# Patient Record
Sex: Male | Born: 1960 | Hispanic: Yes | Marital: Single | Smoking: Former smoker
Health system: Southern US, Community
[De-identification: ages and names within clinical notes are randomized; demographics above are authoritative.]

## PROBLEM LIST (undated history)

## (undated) DIAGNOSIS — M1712 Unilateral primary osteoarthritis, left knee: Secondary | ICD-10-CM

## (undated) DIAGNOSIS — E7800 Pure hypercholesterolemia, unspecified: Secondary | ICD-10-CM

## (undated) DIAGNOSIS — E785 Hyperlipidemia, unspecified: Secondary | ICD-10-CM

## (undated) DIAGNOSIS — E119 Type 2 diabetes mellitus without complications: Secondary | ICD-10-CM

## (undated) DIAGNOSIS — Z720 Tobacco use: Secondary | ICD-10-CM

## (undated) DIAGNOSIS — M4802 Spinal stenosis, cervical region: Secondary | ICD-10-CM

## (undated) HISTORY — DX: Type 2 diabetes mellitus without complications: E11.9

## (undated) HISTORY — DX: Tobacco use: Z72.0

## (undated) HISTORY — DX: Unilateral primary osteoarthritis, left knee: M17.12

## (undated) HISTORY — DX: Hyperlipidemia, unspecified: E78.5

## (undated) HISTORY — DX: Spinal stenosis, cervical region: M48.02

---

## 2010-05-10 ENCOUNTER — Emergency Department (HOSPITAL_COMMUNITY): Payer: Self-pay

## 2010-05-10 ENCOUNTER — Observation Stay (HOSPITAL_COMMUNITY)
Admission: EM | Admit: 2010-05-10 | Discharge: 2010-05-12 | Disposition: A | Discharge status: Home / Self Care | Payer: Self-pay | Attending: Internal Medicine | Admitting: Internal Medicine

## 2010-05-10 DIAGNOSIS — R079 Chest pain, unspecified: Principal | ICD-10-CM | POA: Insufficient documentation

## 2010-05-10 DIAGNOSIS — I10 Essential (primary) hypertension: Secondary | ICD-10-CM | POA: Insufficient documentation

## 2010-05-10 DIAGNOSIS — E119 Type 2 diabetes mellitus without complications: Secondary | ICD-10-CM | POA: Insufficient documentation

## 2010-05-10 DIAGNOSIS — R0609 Other forms of dyspnea: Secondary | ICD-10-CM | POA: Insufficient documentation

## 2010-05-10 DIAGNOSIS — R0989 Other specified symptoms and signs involving the circulatory and respiratory systems: Secondary | ICD-10-CM | POA: Insufficient documentation

## 2010-05-10 DIAGNOSIS — R0602 Shortness of breath: Secondary | ICD-10-CM | POA: Insufficient documentation

## 2010-05-10 DIAGNOSIS — F1010 Alcohol abuse, uncomplicated: Secondary | ICD-10-CM | POA: Insufficient documentation

## 2010-05-10 DIAGNOSIS — R61 Generalized hyperhidrosis: Secondary | ICD-10-CM | POA: Insufficient documentation

## 2010-05-10 LAB — URINALYSIS, ROUTINE W REFLEX MICROSCOPIC
Bilirubin Urine: NEGATIVE
Hgb urine dipstick: NEGATIVE
Ketones, ur: NEGATIVE mg/dL
Specific Gravity, Urine: 1.019 (ref 1.005–1.030)
Urobilinogen, UA: 0.2 mg/dL (ref 0.0–1.0)

## 2010-05-10 LAB — CBC
HCT: 42.8 % (ref 39.0–52.0)
Hemoglobin: 14.9 g/dL (ref 13.0–17.0)
MCV: 91.5 fL (ref 78.0–100.0)
RBC: 4.68 MIL/uL (ref 4.22–5.81)
WBC: 5.3 10*3/uL (ref 4.0–10.5)

## 2010-05-10 LAB — LIPID PANEL
LDL Cholesterol: UNDETERMINED mg/dL (ref 0–99)
VLDL: UNDETERMINED mg/dL (ref 0–40)

## 2010-05-10 LAB — TROPONIN I: Troponin I: 0.02 ng/mL (ref 0.00–0.06)

## 2010-05-10 LAB — BASIC METABOLIC PANEL
BUN: 16 mg/dL (ref 6–23)
CO2: 24 mEq/L (ref 19–32)
Chloride: 102 mEq/L (ref 96–112)
GFR calc non Af Amer: >60 mL/min (ref >60)
Glucose, Bld: 137 mg/dL — ABNORMAL HIGH (ref 70–99)
Potassium: 3.7 mEq/L (ref 3.5–5.1)
Sodium: 134 mEq/L — ABNORMAL LOW (ref 135–145)

## 2010-05-10 LAB — HEPATIC FUNCTION PANEL
AST: 30 U/L (ref 0–37)
Bilirubin, Direct: 0.1 mg/dL (ref 0.0–0.3)
Indirect Bilirubin: 0.9 mg/dL (ref 0.3–0.9)
Total Protein: 6.6 g/dL (ref 6.0–8.3)

## 2010-05-10 LAB — PROTIME-INR
INR: 0.89 (ref 0.00–1.49)
Prothrombin Time: 12.2 seconds (ref 11.6–15.2)

## 2010-05-10 LAB — CK TOTAL AND CKMB (NOT AT ARMC)
Relative Index: INVALID (ref 0.0–2.5)
Total CK: 86 U/L (ref 7–232)

## 2010-05-10 LAB — RAPID URINE DRUG SCREEN, HOSP PERFORMED
Amphetamines: NOT DETECTED
Opiates: POSITIVE — AB
Tetrahydrocannabinol: POSITIVE — AB

## 2010-05-10 LAB — POCT CARDIAC MARKERS
CKMB, poc: <1 ng/mL — ABNORMAL LOW (ref 1.0–8.0)
Troponin i, poc: <0.05 ng/mL (ref 0.00–0.09)
Troponin i, poc: <0.05 ng/mL (ref 0.00–0.09)

## 2010-05-10 LAB — TSH: TSH: 0.931 u[IU]/mL (ref 0.350–4.500)

## 2010-05-10 LAB — APTT: aPTT: 25 seconds (ref 24–37)

## 2010-05-10 LAB — HEMOGLOBIN A1C: Mean Plasma Glucose: 131 mg/dL — ABNORMAL HIGH (ref <117)

## 2010-05-11 DIAGNOSIS — R079 Chest pain, unspecified: Secondary | ICD-10-CM

## 2010-05-11 LAB — CARDIAC PANEL(CRET KIN+CKTOT+MB+TROPI)
CK, MB: 1.2 ng/mL (ref 0.3–4.0)
Total CK: 89 U/L (ref 7–232)

## 2010-05-11 LAB — CBC
Hemoglobin: 15.9 g/dL (ref 13.0–17.0)
RBC: 4.91 MIL/uL (ref 4.22–5.81)
WBC: 7.2 10*3/uL (ref 4.0–10.5)

## 2010-05-11 LAB — BASIC METABOLIC PANEL
CO2: 29 mEq/L (ref 19–32)
Calcium: 9.9 mg/dL (ref 8.4–10.5)
Chloride: 96 mEq/L (ref 96–112)
GFR calc Af Amer: >60 mL/min (ref >60)
Sodium: 137 mEq/L (ref 135–145)

## 2010-05-11 LAB — LIPID PANEL
LDL Cholesterol: UNDETERMINED mg/dL (ref 0–99)
Triglycerides: 1816 mg/dL — ABNORMAL HIGH (ref <150)
VLDL: UNDETERMINED mg/dL (ref 0–40)

## 2010-05-12 ENCOUNTER — Inpatient Hospital Stay (HOSPITAL_COMMUNITY): Payer: Self-pay

## 2010-05-12 LAB — URINE CULTURE: Culture: NO GROWTH

## 2010-05-12 LAB — GLUCOSE, CAPILLARY

## 2010-05-12 MED ORDER — TECHNETIUM TC 99M TETROFOSMIN IV KIT
10.0000 | PACK | Freq: Once | INTRAVENOUS | Status: AC | PRN
  Administered 2010-05-12: 10 via INTRAVENOUS

## 2010-05-12 MED ORDER — TECHNETIUM TC 99M TETROFOSMIN IV KIT
30.0000 | PACK | Freq: Once | INTRAVENOUS | Status: AC | PRN
  Administered 2010-05-12: 30 via INTRAVENOUS

## 2010-05-13 ENCOUNTER — Inpatient Hospital Stay (HOSPITAL_COMMUNITY): Payer: Self-pay

## 2010-05-14 NOTE — Discharge Summary (Signed)
  [name], [name]                   ACCOUNT NO.:  [id number]  MEDICAL RECORD NO.:  [id number]           PATIENT TYPE:  I  LOCATION:  2011                         FACILITY:  [facility]  PHYSICIAN:  Conley Canal, MD      DATE OF BIRTH:  09/02/1952  DATE OF ADMISSION:  05/10/2010 DATE OF DISCHARGE:  05/11/2010                        DISCHARGE SUMMARY - REFERRING   PRIMARY CARE PHYSICIAN:  None.  The patient to be seen by [facility].  CONSULTING PHYSICIAN:  Noralyn Pick. Eden Emms, MD, [facility] [place] [facility]  DISCHARGE DIAGNOSES: 1. Chest pain most likely costochondritis, myocardial infarction ruled     out by serial cardiac enzymes, negative stress test and     echocardiogram. 2. Hypertension. 3. Borderline diabetes mellitus. 4. Alcohol abuse. 5. Hypertriglyceridemia.  DISCHARGE MEDICATIONS: 1. Aspirin 81 mg daily. 2. Gemfibrozil 600 mg daily with meals. 3. Hydrochlorothiazide 25 mg daily. 4. Metformin 500 mg twice daily. 5. Simvastatin 20 mg at bedtime.  PROCEDURES PERFORMED: 1. Myoview stress test, reportedly negative. 2. A 2-D echocardiogram showing normal EF at 55% with no wall motion     abnormalities. 3. Chest x-ray on May 12, 2010, showing some reactive airways     disease.  HOSPITAL COURSE:  [name] is a pleasant 50 year old male with borderline diabetes mellitus, hypertension, apparently not on medications who came in with complaints of chest pain concerning for acute coronary syndrome.  Apparently, the patient had been having chest pain on and off for 3 weeks prior to the admission raising concern for angina.  Upon admission, the patient had serial cardiac enzymes, which were negative x3.  He was seen by Cardiology who performed 2-D echocardiogram and Myoview stress test, which were unremarkable. Initially, there was concern for urinary tract infection, but UA and urine culture were negative.  Hence, the patient was taken off ciprofloxacin, which had been started  at admission.  The patient was also noted to have hyperlipidemia with hypertriglyceridemia.  Fasting total cholesterol was 308 and triglycerides 1816.  The patient was hence started on gemfibrozil and Zocor.  The patient was also noted to have hemoglobin A1c of 6.2 with fasting blood glucose around 165.  The patient has known that he has borderline diabetes mellitus.  At this point, the patient was started on metformin and I encouraged him to check his blood sugars before meals and take it to the next doctor's appointment. An appointment was made with [facility] for June 10, 2010.  The patient was encouraged to be on heart-healthy and carb-consistent diet. He is discharged in a stable condition.  The time spent for this discharge preparation is less than 30 minutes.     Conley Canal, MD     SR/MEDQ  D:  05/12/2010  T:  05/12/2010  Job:  [id number]  cc:   Noralyn Pick. Eden Emms, MD, [facility]  Electronically Signed by Conley Canal  on 05/14/2010 07:14:06 PM

## 2010-05-17 NOTE — H&P (Signed)
[name], [name]                   ACCOUNT NO.:  [id number]  MEDICAL RECORD NO.:  [id number]           PATIENT TYPE:  I  LOCATION:  2011                         FACILITY:  [facility]  PHYSICIAN:  Hartley Barefoot, MD    DATE OF BIRTH:  09/02/1952  DATE OF ADMISSION:  05/10/2010 DATE OF DISCHARGE:                             HISTORY & PHYSICAL   PRIMARY CARE PHYSICIAN:  Unassigned.  CHIEF COMPLAINT:  Chest pain.  HISTORY OF PRESENT ILLNESS:  This is a 50 year old male, Spanish speaker from [place], who presented to the emergency department complaining of chest pain.  He denies any past medical history.  He relates that he has started to have chest pain around 9 p.m. the night prior to admission. He started to have another episode at 4 a.m. and he decided to present to the emergency department.  He related that he has been having these chest pain on and off for the last 3 weeks.  He described the pain as pressure like in the middle of his chest radiating to his left arm. Pain 4/10, is not accompanied by shortness of breath.  He can be sitting in his chair or lying down in the bed when he has the episode.  He feels that the pain is getting more constant now.  He relates that since 4 a.m. he has been having these chest pain.  It has decreased in intensity.  Not related with shortness of breath.  He does relate some times pain gets worse when he eats.  He denies chest pain or shortness of breath on exertion.  He relates that he has been using nitroglycerin sublingual, that he is getting from his girlfriend.  He relates that the pain improved slightly with nitroglycerin.  Of note, chest pain is reproducible on palpation some times. The patient denies any recent trouble.  No fevers, chills, cough.  No pain is not related with activity.  ALLERGIES:  No known drug allergies.  SOCIAL HISTORY:  He smokes cigarette occasionally, drinks alcohol on the weekends, like 10 beers.  He relates use of  marijuana, although denies cocaine.  He is single.  He has a girlfriend.  He has two children in the [place].  He is from [place].  He has been living in the [place] for the last 10 years.  He is a Corporate investment banker.  FAMILY HISTORY:  Mother is healthy, she is 49 year old.  Father had history of diabetes, died at 63 year old.  He has seen [place] for diabetes complication.  MEDICATION:  He is not taking any medication.  He was taking some nitroglycerin from his girlfriend.  PAST MEDICAL HISTORY:  None.  REVIEW OF SYSTEMS:  He relates that he feels warm and hot.  PHYSICAL EXAMINATION:  VITALS:  Pulse 74, blood pressure 143/93, respirations 20, sat 99 on 2 L, temperature 97.0. GENERAL:  The patient lying in bed in no acute distress. HEENT:  Head atraumatic, normocephalic.  Eyes, anicteric.  Pupils equal reactive to light.  Extraocular muscles intact. NECK:  Supple.  No JVD.  No carotid bruits. No thyromegaly. CARDIOVASCULAR:  S1  and S2.  Regular rhythm and rate.  No murmur, rubs, or gallops. LUNGS:  Bilateral good air movement.  Clear to auscultation.  No rhonchi. ABDOMEN:  Bowel sounds positive.  Soft, nontender, nondistended.  No rigidity. EXTREMITIES:  Pulses present.  No edema. NEUROLOGIC:  Alert and oriented x3.  Cranial nerves II through XII intact.  Sensation grossly intact.  Motor strength 5/5 throughout.  ADMISSION LABORATORY DATA:  Sodium 134, potassium 3.7, chloride 102, bicarb 24, BUN 16, creatinine 0.86, glucose 137, calcium 9.0. Hemoglobin 14.9, hematocrit 42, white blood cell 5.3, platelets 175,000. Troponin 0.05.  ASSESSMENT AND PLAN: 1. Chest pain with some atypical feature in a 50 year old with history     of smoking.  We will admit to rule out acute coronary syndrome.     Initial EKG, no ST-segment elevation or depression.  Initial set of     cardiac enzymes, troponin negative, CK-MB normal.  We will start     nitroglycerin p.r.n. for pain, morphine, we will  start     hydrochlorothiazide for blood pressure control and p.r.n.     hydralazine.  I will check TSH, hemoglobin A1c, fasting lipid     panel, UDS, lipase, liver function test.  He is low risk for     pulmonary embolism, I will check D-dimer.  If D-dimer is high, we     will check CT angio.  I will check 2-D echo.  If everything comes     back normal, the patient might require Myoview as an outpatient. 2. Hypertension.  I will start hydrochlorothiazide.  Hydralazine     p.r.n. 3. History of alcohol use.  Start thiamine and folate.  Monitor on     CIWA protocol.  We will counsel for alcohol and smoking. 4. For deep vein thrombosis prophylaxis, Lovenox.     Hartley Barefoot, MD     BR/MEDQ  D:  05/10/2010  T:  05/10/2010  Job:  [id number]  Electronically Signed by Hartley Barefoot MD on 05/17/2010 10:30:37 AM
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
## 2011-01-23 ENCOUNTER — Emergency Department (HOSPITAL_COMMUNITY)
Admission: EM | Admit: 2011-01-23 | Discharge: 2011-01-23 | Disposition: A | Discharge status: Home / Self Care | Payer: No Typology Code available for payment source | Attending: Emergency Medicine | Admitting: Emergency Medicine

## 2011-01-23 ENCOUNTER — Emergency Department (HOSPITAL_COMMUNITY): Payer: No Typology Code available for payment source

## 2011-01-23 ENCOUNTER — Encounter: Payer: Self-pay | Admitting: *Deleted

## 2011-01-23 DIAGNOSIS — M25519 Pain in unspecified shoulder: Secondary | ICD-10-CM | POA: Insufficient documentation

## 2011-01-23 DIAGNOSIS — S8000XA Contusion of unspecified knee, initial encounter: Secondary | ICD-10-CM | POA: Insufficient documentation

## 2011-01-23 DIAGNOSIS — M25569 Pain in unspecified knee: Secondary | ICD-10-CM | POA: Insufficient documentation

## 2011-01-23 DIAGNOSIS — M542 Cervicalgia: Secondary | ICD-10-CM | POA: Insufficient documentation

## 2011-01-23 DIAGNOSIS — S8390XA Sprain of unspecified site of unspecified knee, initial encounter: Secondary | ICD-10-CM

## 2011-01-23 DIAGNOSIS — IMO0002 Reserved for concepts with insufficient information to code with codable children: Secondary | ICD-10-CM | POA: Insufficient documentation

## 2011-01-23 DIAGNOSIS — S161XXA Strain of muscle, fascia and tendon at neck level, initial encounter: Secondary | ICD-10-CM

## 2011-01-23 DIAGNOSIS — S139XXA Sprain of joints and ligaments of unspecified parts of neck, initial encounter: Secondary | ICD-10-CM | POA: Insufficient documentation

## 2011-01-23 MED ORDER — IBUPROFEN 800 MG PO TABS
800.0000 mg | ORAL_TABLET | Freq: Once | ORAL | Status: AC
  Administered 2011-01-23: 800 mg via ORAL
  Filled 2011-01-23: qty 1

## 2011-01-23 MED ORDER — OXYCODONE-ACETAMINOPHEN 5-325 MG PO TABS: 2.0000 | ORAL_TABLET | ORAL | Status: AC | PRN

## 2011-01-23 MED ORDER — IBUPROFEN 800 MG PO TABS: 800.0000 mg | ORAL_TABLET | Freq: Three times a day (TID) | ORAL | Status: AC
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
## 2011-12-26 ENCOUNTER — Encounter (HOSPITAL_COMMUNITY): Payer: Self-pay

## 2011-12-26 ENCOUNTER — Emergency Department (INDEPENDENT_AMBULATORY_CARE_PROVIDER_SITE_OTHER): Payer: Self-pay

## 2011-12-26 ENCOUNTER — Emergency Department (INDEPENDENT_AMBULATORY_CARE_PROVIDER_SITE_OTHER)
Admission: EM | Admit: 2011-12-26 | Discharge: 2011-12-26 | Disposition: A | Discharge status: Home / Self Care | Payer: Self-pay | Source: Home / Self Care

## 2011-12-26 DIAGNOSIS — S161XXA Strain of muscle, fascia and tendon at neck level, initial encounter: Secondary | ICD-10-CM

## 2011-12-26 DIAGNOSIS — M542 Cervicalgia: Secondary | ICD-10-CM

## 2011-12-26 DIAGNOSIS — M47812 Spondylosis without myelopathy or radiculopathy, cervical region: Secondary | ICD-10-CM

## 2011-12-26 DIAGNOSIS — S139XXA Sprain of joints and ligaments of unspecified parts of neck, initial encounter: Secondary | ICD-10-CM

## 2011-12-26 MED ORDER — MELOXICAM 15 MG PO TABS: 15.0000 mg | ORAL_TABLET | Freq: Every day | ORAL | Status: DC
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
## 2011-12-28 NOTE — ED Provider Notes (Signed)
Medical screening examination/treatment/procedure(s) were performed by non-physician practitioner and as supervising physician I was immediately available for consultation/collaboration.  Jeshawn Melucci, M.D.   Sharday Michl C Cecelia Graciano, MD 12/28/11 0749 

## 2011-12-30 ENCOUNTER — Encounter (HOSPITAL_COMMUNITY): Payer: Self-pay | Admitting: *Deleted

## 2011-12-30 ENCOUNTER — Emergency Department (INDEPENDENT_AMBULATORY_CARE_PROVIDER_SITE_OTHER)
Admission: EM | Admit: 2011-12-30 | Discharge: 2011-12-30 | Disposition: A | Discharge status: Home / Self Care | Payer: Self-pay | Source: Home / Self Care | Attending: Family Medicine | Admitting: Family Medicine

## 2011-12-30 DIAGNOSIS — M542 Cervicalgia: Secondary | ICD-10-CM

## 2011-12-30 DIAGNOSIS — Z041 Encounter for examination and observation following transport accident: Secondary | ICD-10-CM

## 2011-12-30 MED ORDER — DICLOFENAC POTASSIUM 50 MG PO TABS: 50.0000 mg | ORAL_TABLET | Freq: Three times a day (TID) | ORAL | Status: DC
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
## 2013-02-03 ENCOUNTER — Other Ambulatory Visit: Payer: Self-pay

## 2013-02-03 ENCOUNTER — Emergency Department (HOSPITAL_COMMUNITY): Payer: Self-pay

## 2013-02-03 ENCOUNTER — Emergency Department (HOSPITAL_COMMUNITY)
Admission: EM | Admit: 2013-02-03 | Discharge: 2013-02-04 | Disposition: A | Discharge status: Home / Self Care | Payer: Self-pay | Attending: Emergency Medicine | Admitting: Emergency Medicine

## 2013-02-03 DIAGNOSIS — K2970 Gastritis, unspecified, without bleeding: Secondary | ICD-10-CM | POA: Insufficient documentation

## 2013-02-03 DIAGNOSIS — Z7982 Long term (current) use of aspirin: Secondary | ICD-10-CM | POA: Insufficient documentation

## 2013-02-03 LAB — POCT I-STAT TROPONIN I: Troponin i, poc: 0 ng/mL (ref 0.00–0.08)

## 2013-02-03 LAB — CBC WITH DIFFERENTIAL/PLATELET
Basophils Absolute: 0 10*3/uL (ref 0.0–0.1)
Basophils Relative: 0 % (ref 0–1)
Eosinophils Absolute: 0.3 10*3/uL (ref 0.0–0.7)
Eosinophils Relative: 3 % (ref 0–5)
HCT: 44.2 % (ref 39.0–52.0)
MCH: 33.1 pg (ref 26.0–34.0)
MCHC: 36.7 g/dL — ABNORMAL HIGH (ref 30.0–36.0)
MCV: 90.4 fL (ref 78.0–100.0)
Monocytes Absolute: 0.7 10*3/uL (ref 0.1–1.0)
Neutro Abs: 7.7 10*3/uL (ref 1.7–7.7)
RDW: 12.7 % (ref 11.5–15.5)

## 2013-02-03 LAB — COMPREHENSIVE METABOLIC PANEL
AST: 27 U/L (ref 0–37)
Albumin: 4 g/dL (ref 3.5–5.2)
Calcium: 9.7 mg/dL (ref 8.4–10.5)
Creatinine, Ser: 0.63 mg/dL (ref 0.50–1.35)
Total Protein: 7.6 g/dL (ref 6.0–8.3)

## 2013-02-03 MED ORDER — GI COCKTAIL ~~LOC~~
30.0000 mL | Freq: Once | ORAL | Status: AC
  Administered 2013-02-03: 30 mL via ORAL
  Filled 2013-02-03: qty 30

## 2013-02-03 MED ORDER — HYDROCODONE-ACETAMINOPHEN 5-325 MG PO TABS: 2.0000 | ORAL_TABLET | Freq: Four times a day (QID) | ORAL | Status: DC | PRN

## 2013-02-03 MED ORDER — MORPHINE SULFATE 4 MG/ML IJ SOLN
4.0000 mg | Freq: Once | INTRAMUSCULAR | Status: AC
  Administered 2013-02-03: 4 mg via INTRAMUSCULAR
  Filled 2013-02-03: qty 1

## 2013-02-03 MED ORDER — RANITIDINE HCL 150 MG PO CAPS: 150.0000 mg | ORAL_CAPSULE | Freq: Every day | ORAL | Status: DC

## 2013-02-03 MED ORDER — ONDANSETRON HCL 4 MG PO TABS: 4.0000 mg | ORAL_TABLET | Freq: Four times a day (QID) | ORAL | Status: DC

## 2013-02-03 NOTE — ED Provider Notes (Signed)
CSN: [id number]     Arrival date & time 02/03/13  2029 History   First MD Initiated Contact with Patient 02/03/13 2049     Chief Complaint  Patient presents with  . Chest Pain   (Consider location/radiation/quality/duration/timing/severity/associated sxs/prior Treatment) HPI Comments: Patient is a 52 year old male who presents today for epigastric pain since yesterday. The pain began 8 PM when he was eating chicken salad with jalapenos in it. It is a sharp burning pain that radiates up into his chest and into his left shoulder. This pain has been constant since 8 PM last night. Nothing makes the pain better. Palpation makes the pain worse. He denies any associated symptoms including diaphoresis, nausea, vomiting, constipation, diarrhea, fever, chills. He has had pain like this in the past which resolved spontaneously in the past. Has been taking Prevacid for the past few weeks which has mildly improved his symptoms. He began taking this at the direction of his mother.  The history is provided by the patient. No language interpreter was used.    No past medical history on file. No past surgical history on file. No family history on file. History  Substance Use Topics  . Smoking status: Never Smoker   . Smokeless tobacco: Not on file  . Alcohol Use: 1.2 oz/week    2 Cans of beer per week    Review of Systems  Constitutional: Negative for fever, chills and diaphoresis.  Respiratory: Negative for shortness of breath.   Cardiovascular: Positive for chest pain.  Gastrointestinal: Positive for abdominal pain. Negative for nausea and vomiting.  All other systems reviewed and are negative.    Allergies  Review of patient's allergies indicates no known allergies.  Home Medications   Current Outpatient Rx  Name  Route  Sig  Dispense  Refill  . aspirin EC 81 MG tablet   Oral   Take 81 mg by mouth daily.          BP 156/90  Pulse 79  SpO2 98% Physical Exam  Nursing note and  vitals reviewed. Constitutional: He is oriented to person, place, and time. He appears well-developed and well-nourished. No distress.  HENT:  Head: Normocephalic and atraumatic.  Right Ear: External ear normal.  Left Ear: External ear normal.  Nose: Nose normal.  Eyes: Conjunctivae are normal.  Neck: Normal range of motion. No tracheal deviation present.  Cardiovascular: Normal rate, regular rhythm, normal heart sounds, intact distal pulses and normal pulses.   Pulmonary/Chest: Effort normal and breath sounds normal. No stridor.  Abdominal: Soft. He exhibits no distension and no mass. There is tenderness in the epigastric area and left upper quadrant. There is no rigidity, no rebound, no guarding, no tenderness at McBurney's point and negative Murphy's sign.  Tender to deep palpation  Musculoskeletal: Normal range of motion.  Neurological: He is alert and oriented to person, place, and time.  Skin: Skin is warm and dry. He is not diaphoretic.  Psychiatric: He has a normal mood and affect. His behavior is normal.    ED Course  Procedures (including critical care time) Labs Review Labs Reviewed  CBC WITH DIFFERENTIAL - Abnormal; Notable for the following:    WBC 11.3 (*)    MCHC 36.7 (*)    All other components within normal limits  COMPREHENSIVE METABOLIC PANEL - Abnormal; Notable for the following:    Glucose, Bld 150 (*)    All other components within normal limits  POCT I-STAT TROPONIN I   Imaging Review Dg  Chest 2 View  02/03/2013   CLINICAL DATA:  Upper abdominal pain.  EXAM: CHEST  2 VIEW  COMPARISON:  05/10/2010  FINDINGS: Scarring or subsegmental atelectasis peripherally at the right lung base. The lungs appear otherwise clear. No pleural effusion. Cardiac and mediastinal margins appear normal.  IMPRESSION: Scarring or subsegmental atelectasis peripherally at the right lung base. Otherwise negative.   Electronically Signed   By: Herbie Baltimore M.D.   On: 02/03/2013 22:05     EKG Interpretation   None       MDM   1. Gastritis    Patient is nontoxic, nonseptic appearing, in no apparent distress.  Patient's pain and other symptoms adequately managed in emergency department.  Fluid bolus given.  Labs, imaging and vitals reviewed.  Patient does not meet the SIRS or Sepsis criteria.  On repeat exam patient does not have a surgical abdomen and there are no peritoneal signs.  No indication of appendicitis, bowel obstruction, bowel perforation, cholecystitis, diverticulitis.  Patient's symptoms are consistent with gastritis. Patient discharged home with symptomatic treatment and given strict instructions for follow-up with GI.  I have also discussed reasons to return immediately to the ER.  Patient expresses understanding and agrees with plan. Dr. Rosalia Hammers evaluated patient and agrees with plan.        Mora Bellman, PA-C 02/04/13 0030

## 2013-02-03 NOTE — ED Notes (Signed)
Pt by EMS for eval of CP/L epigastric pain that now radiates up to L shoulder and down left arm. L arm also became tingly.  Pt took 2 sl ntg prior to ems arrival, which reduced pain from 10/10 to 8/10. Pt slight decline in O2 to 92% and was placed on 2L [place]. Pt stated he felt better and O2 increased to 95% but there was no reduction in pain. No other associated sx.  Pain not reproducible on palpation. Pt informed EMS pain began at 8pm last night but at that time it did not radiate. Pt took milk of mag without relief.

## 2013-02-04 MED ORDER — ONDANSETRON 4 MG PO TBDP
8.0000 mg | ORAL_TABLET | Freq: Once | ORAL | Status: AC
Start: 2013-02-04 — End: 2013-02-04
  Administered 2013-02-04: 8 mg via ORAL
  Filled 2013-02-04: qty 2

## 2013-02-04 MED ORDER — OXYCODONE-ACETAMINOPHEN 5-325 MG PO TABS
2.0000 | ORAL_TABLET | Freq: Once | ORAL | Status: AC
  Administered 2013-02-04: 2 via ORAL
  Filled 2013-02-04: qty 2

## 2013-02-05 NOTE — ED Provider Notes (Signed)
History/physical exam/procedure(s) were performed by non-physician practitioner and as supervising physician I was immediately available for consultation/collaboration. I have reviewed all notes and am in agreement with care and plan.   Kenyon Eichelberger S Chancy Claros, MD 02/05/13 1413 

## 2014-09-07 IMAGING — CR DG CHEST 2V
2 series · 2 of 2 positions shown · non-contrast
Comparison: 05/10/2010

CLINICAL DATA: Upper abdominal pain.

EXAM:
CHEST  2 VIEW

[w chest pa]
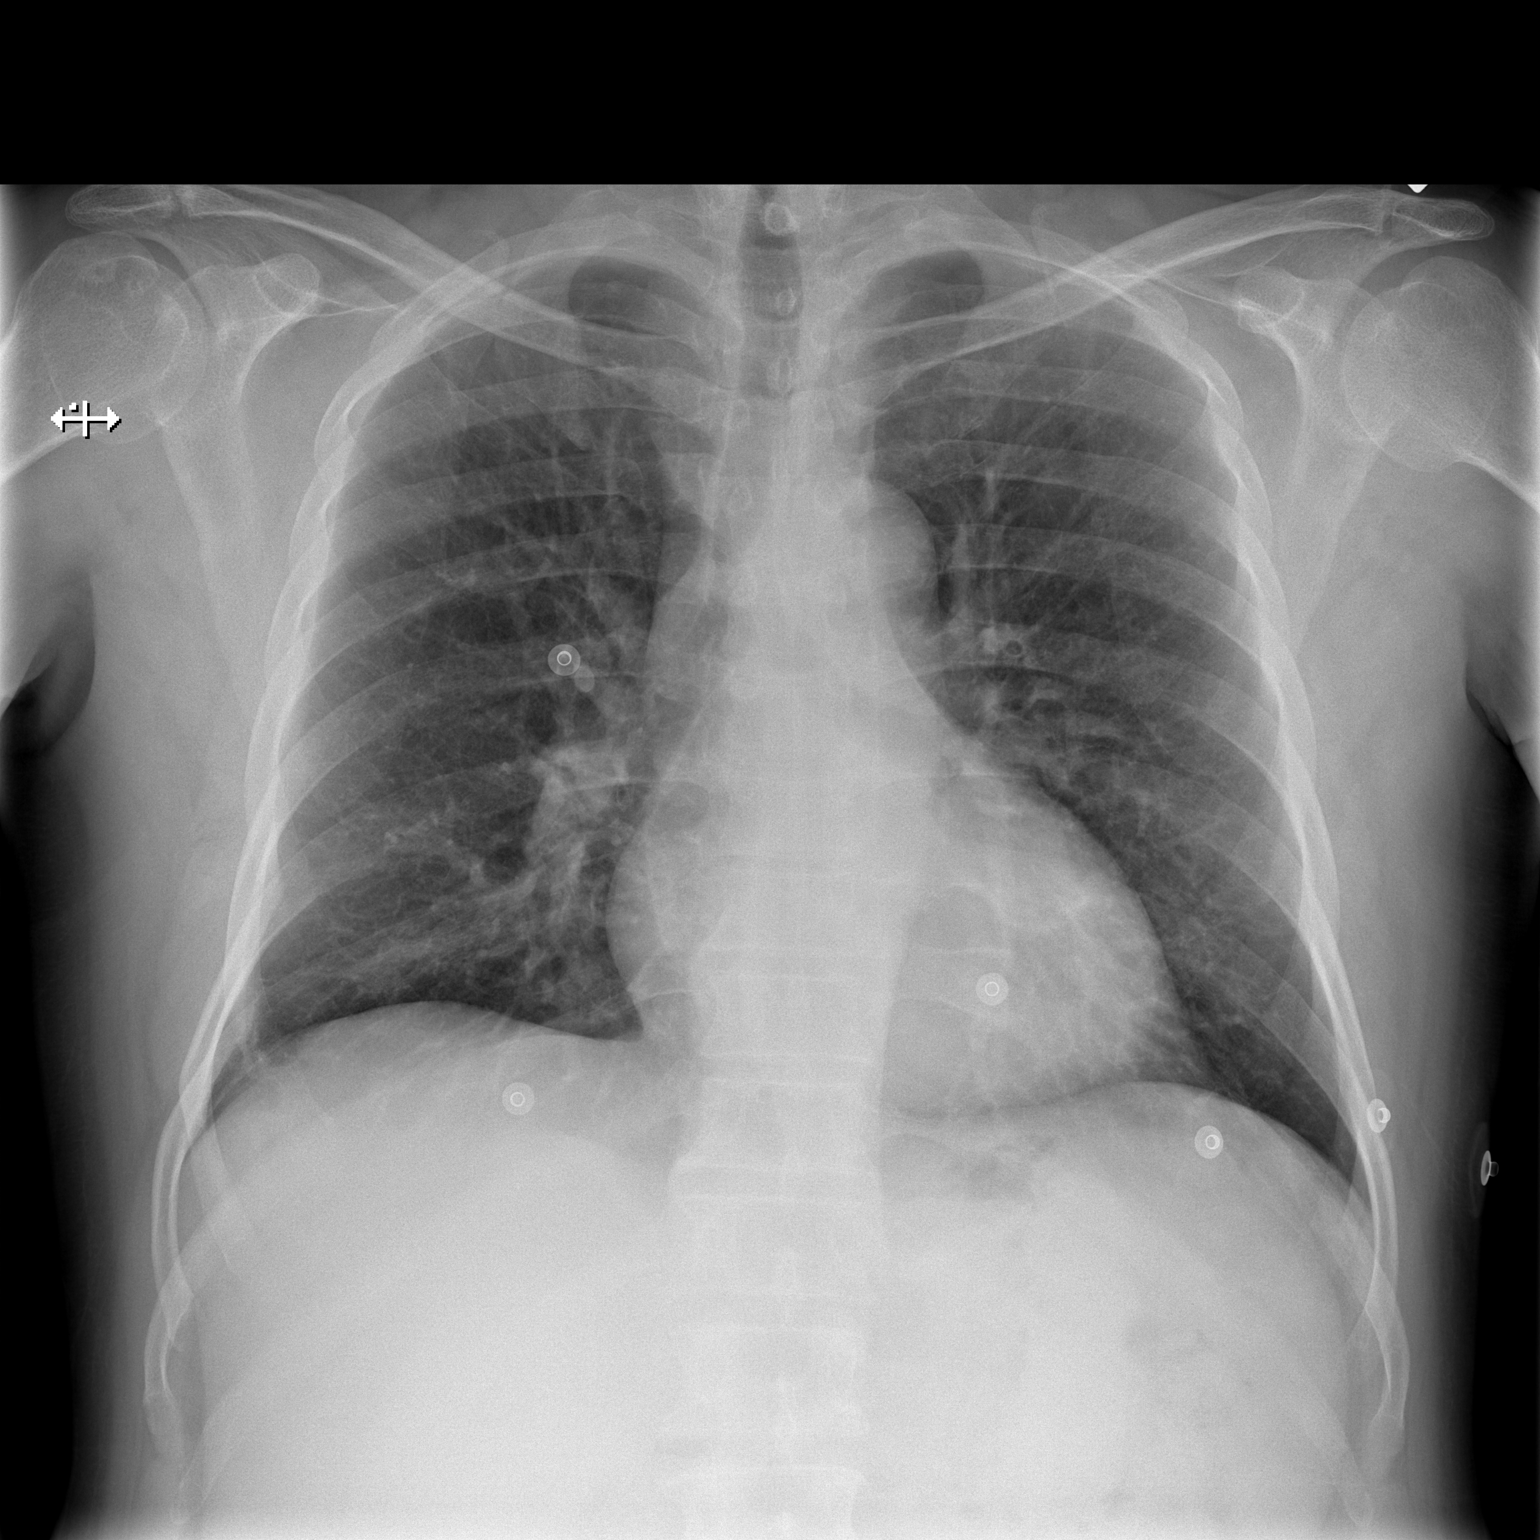

[w chest lat]
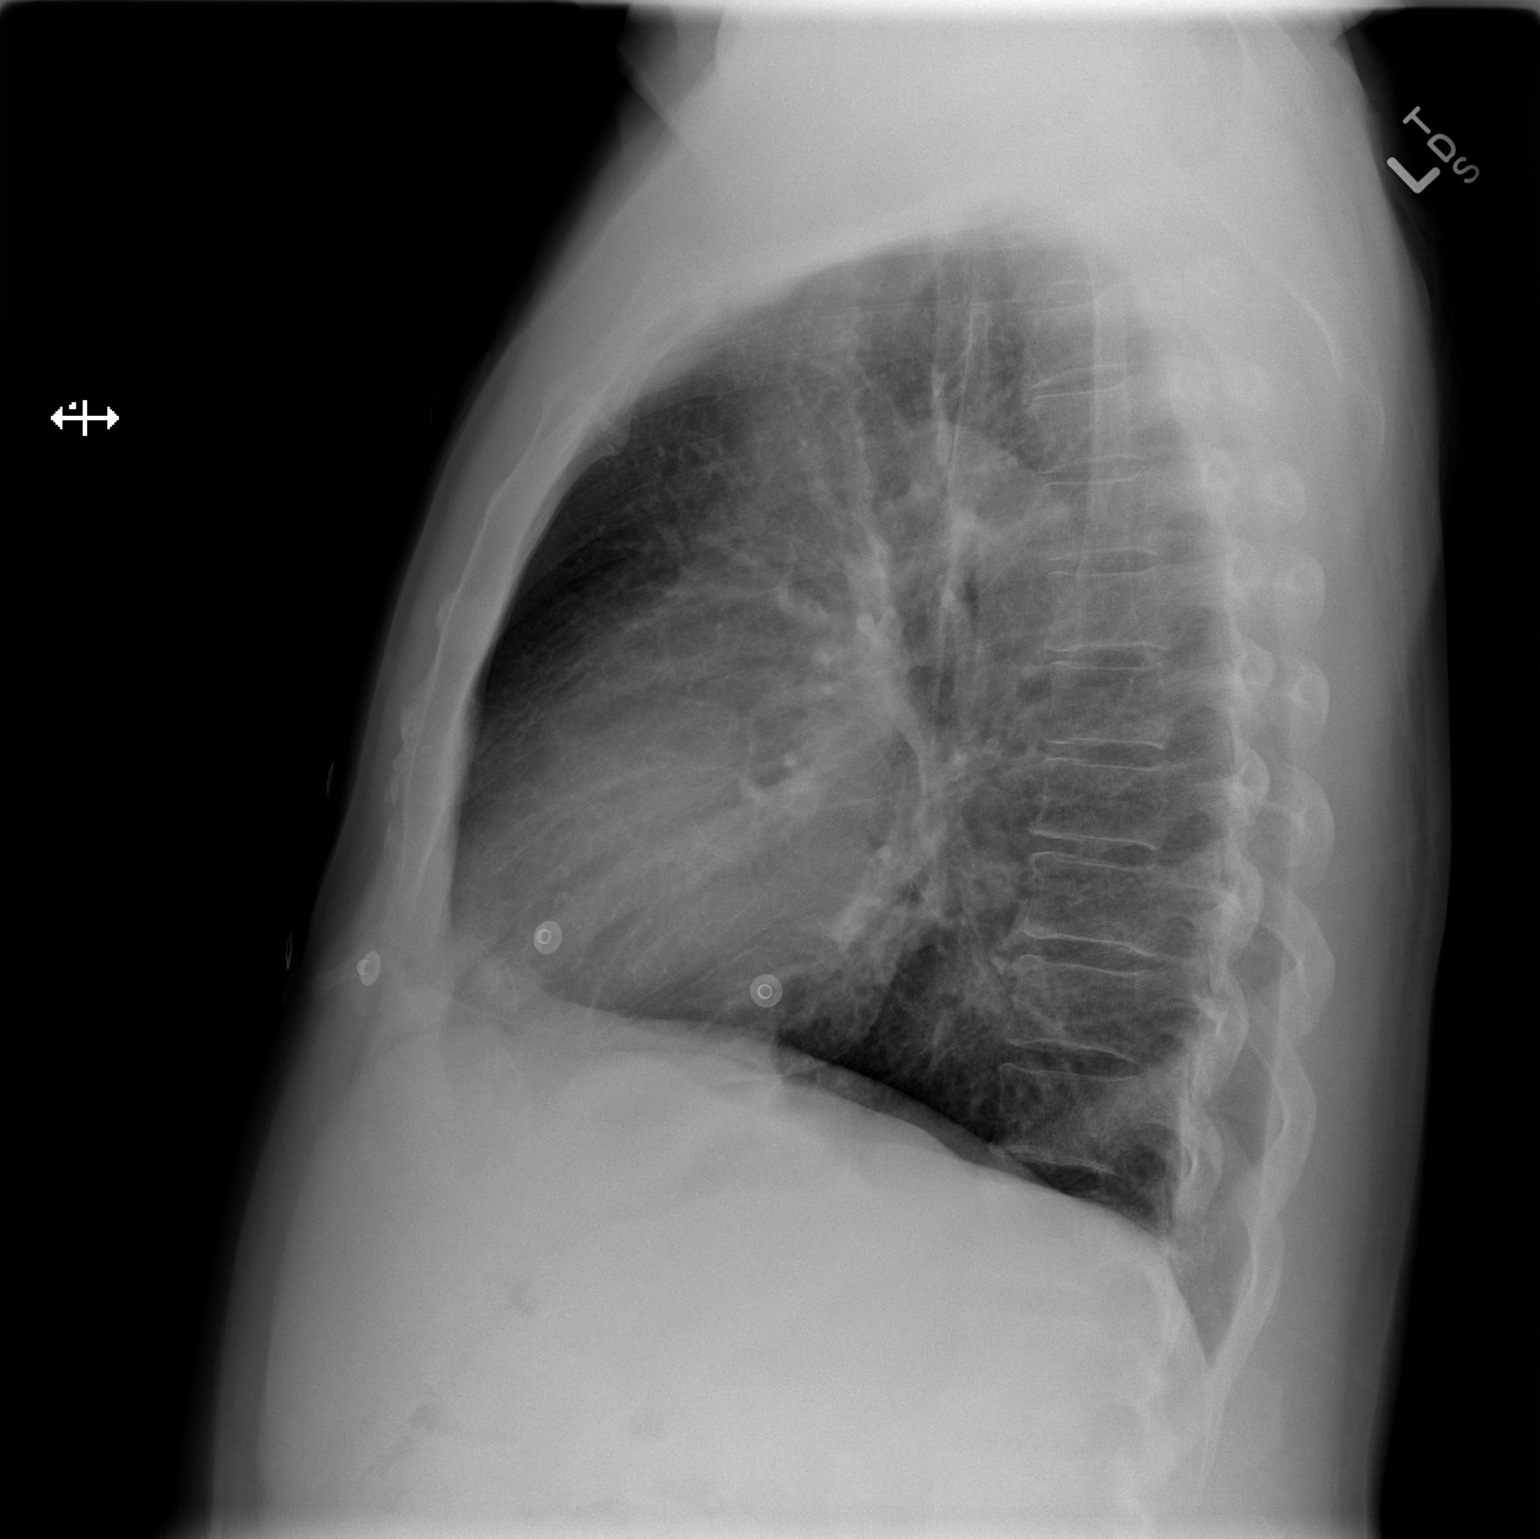

[2 of 2 positions shown; findings below may reference images not displayed]

FINDINGS: Scarring or subsegmental atelectasis peripherally at the right lung
base. The lungs appear otherwise clear. No pleural effusion. Cardiac
and mediastinal margins appear normal.
IMPRESSION: Scarring or subsegmental atelectasis peripherally at the right lung
base. Otherwise negative.

## 2018-01-05 ENCOUNTER — Emergency Department (HOSPITAL_COMMUNITY)
Admission: EM | Admit: 2018-01-05 | Discharge: 2018-01-05 | Disposition: A | Discharge status: Home / Self Care | Payer: Self-pay | Attending: Emergency Medicine | Admitting: Emergency Medicine

## 2018-01-05 ENCOUNTER — Encounter (HOSPITAL_COMMUNITY): Payer: Self-pay

## 2018-01-05 DIAGNOSIS — Z79899 Other long term (current) drug therapy: Secondary | ICD-10-CM | POA: Insufficient documentation

## 2018-01-05 DIAGNOSIS — R21 Rash and other nonspecific skin eruption: Secondary | ICD-10-CM | POA: Insufficient documentation

## 2018-01-05 DIAGNOSIS — E119 Type 2 diabetes mellitus without complications: Secondary | ICD-10-CM | POA: Insufficient documentation

## 2018-01-05 DIAGNOSIS — Z7982 Long term (current) use of aspirin: Secondary | ICD-10-CM | POA: Insufficient documentation

## 2018-01-05 DIAGNOSIS — L989 Disorder of the skin and subcutaneous tissue, unspecified: Secondary | ICD-10-CM

## 2018-01-05 DIAGNOSIS — E11628 Type 2 diabetes mellitus with other skin complications: Secondary | ICD-10-CM

## 2018-01-05 NOTE — ED Provider Notes (Signed)
[facility] EMERGENCY DEPARTMENT Provider Note   CSN: [id number] Arrival date & time: 01/05/18  1130     History   Chief Complaint No chief complaint on file.   HPI [name] is a 57 y.o. male here for evaluation of rash. Rash consists of two purple "bruise" like lesions to right forearm.  First lesions closer to elbow began 8 months ago, the second lesion closer to wrist began 4 months ago.  States he uses lemon and salt to rub them.  He used lemon and salt on them two days ago and they are not red, itchy, dry.  Purple lesions have been constant, persistent for months. They are non tender, non edematous, without warmth, drainage.  He removed a tick from his neck close to 10-12 months ago.  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 No exposure to new medications or new pets or new topical cream/hygiene products.  He denies fevers, mouth lesions, headaches, body aches, abdominal pain, vomiting or diarrhea. No alleviating factors. His roommate gave him 3 pills for "infection".  HPI  History reviewed. No pertinent past medical history.  There are no active problems to display for this patient.   History reviewed. No pertinent surgical history.      Home Medications    Prior to Admission medications   Medication Sig Start Date End Date Taking? Authorizing Provider  aspirin EC 81 MG tablet Take 81 mg by mouth daily.    [provider]  HYDROcodone-acetaminophen (NORCO/VICODIN) 5-325 MG per tablet Take 2 tablets by mouth every 6 (six) hours as needed for severe pain. 02/03/13   Junious Silk, PA-C  ondansetron (ZOFRAN) 4 MG tablet Take 1 tablet (4 mg total) by mouth every 6 (six) hours. 02/03/13   Junious Silk, PA-C  ranitidine (ZANTAC) 150 MG capsule Take 1 capsule (150 mg total) by mouth daily. 02/03/13   Junious Silk, PA-C    Family History No family history on file.  Social History Social History   Tobacco Use  . Smoking status:  Never Smoker  Substance Use Topics  . Alcohol use: Yes    Alcohol/week: 2.0 standard drinks    Types: 2 Cans of beer per week  . Drug use: No     Allergies   Patient has no known allergies.   Review of Systems Review of Systems  Skin: Positive for color change and rash.  All other systems reviewed and are negative.    Physical Exam Updated Vital Signs BP 130/90   Pulse 95   Temp 98.3 F (36.8 C) (Oral)   Resp 18   SpO2 100%   Physical Exam  Constitutional: He is oriented to person, place, and time. He appears well-developed and well-nourished.  Non-toxic appearance.  HENT:  Head: Normocephalic.  Right Ear: External ear normal.  Left Ear: External ear normal.  Nose: Nose normal.  No intraoral lesions   Eyes: Conjunctivae and EOM are normal.  Neck: Full passive range of motion without pain.  Cardiovascular: Normal rate.  Pulmonary/Chest: Effort normal. No tachypnea. No respiratory distress.  Musculoskeletal: Normal range of motion.  Neurological: He is alert and oriented to person, place, and time.  Skin: Skin is warm and dry. Capillary refill takes less than 2 seconds. Rash noted.  Non tender circular bruise like lesions to right forearm. Mild circumferential erythema, dry skin.   Psychiatric: His behavior is normal. Thought content normal.       ED Treatments / Results  Labs (all labs ordered are listed, but only abnormal results are displayed) Labs Reviewed - No data to display  EKG None  Radiology No results found.  Procedures Procedures (including critical care time)  Medications Ordered in ED Medications - No data to display   Initial Impression / Assessment and Plan / ED Course  I have reviewed the triage vital signs and the nursing notes.  Pertinent labs & imaging results that were available during my care of the patient were reviewed by me and considered in my medical decision making (see chart for details).     57 yo here with two  lesions on right forearm for 4-8 months.  He rubbed lemon and salt on them and they became red, itchy, dry.  He removed a tick from his neck close to a year go.  However lesions today are not typical of lyme or RMSF.  No facial or oral angioedema.  No respiratory compromise.  No new recent exposure to possible allergen although he does work outside in [employer].  No new topical hygiene products used.  No evidence of animal bite wounds.  No fluctuance, induration that would suggest abscess or cellulitis. Very low suspicion for dermatological emergency including EM, SJS, TEN.  Patient otherwise feels well and at baseline.  Etiology of lesions unclear.  He was exposed to asbestos over 10 years ago.  But has no systemic symptoms.  I do not think emergent labs/imaging indicated today. Will dc with hydrocortisone cream.  Had lengthy discussion of importance to f/u with dermatology for re-evaluation or biopsy.  Steroids not indicated at this time.  Strict ED return precautions given, mother is aware of red flag symptoms that would warrant return to ED for further evaluation and tx.     Final Clinical Impressions(s) / ED Diagnoses   Final diagnoses:  Lesion of skin due to diabetes mellitus [facility])    ED Discharge Orders    None       Jerrell Mylar 01/05/18 1349    Mesner, Barbara Cower, MD 01/06/18 [phone]

## 2018-01-05 NOTE — Discharge Instructions (Signed)
You were seen in the ER for two skin lesions.   I think the redness is from using lemon and salt on it. Apply thin layer of hydrocortisone cream which will help with irritation, redness, itching.   You need close follow up with dermatology for further evaluation and further testing possible biopsy  Return to ER for swelling, redness, warmth, pain, pus, fevers, mouth lesions.

## 2018-01-05 NOTE — ED Notes (Signed)
Pt also c/o of rt shoulder pain and hand that feel tingling and numbness in rt hand

## 2018-01-05 NOTE — ED Triage Notes (Signed)
Patient with 2 areas on right arm of circular rash. Has had previously and resolved without treatment. No bites, no exposure No pain, no itching

## 2022-01-06 ENCOUNTER — Other Ambulatory Visit: Payer: Self-pay

## 2022-01-06 ENCOUNTER — Emergency Department (HOSPITAL_COMMUNITY): Payer: Self-pay

## 2022-01-06 ENCOUNTER — Emergency Department (HOSPITAL_COMMUNITY)
Admission: EM | Admit: 2022-01-06 | Discharge: 2022-01-06 | Disposition: A | Discharge status: Home / Self Care | Payer: Self-pay | Attending: Emergency Medicine | Admitting: Emergency Medicine

## 2022-01-06 ENCOUNTER — Encounter (HOSPITAL_COMMUNITY): Payer: Self-pay

## 2022-01-06 DIAGNOSIS — M7989 Other specified soft tissue disorders: Secondary | ICD-10-CM | POA: Insufficient documentation

## 2022-01-06 DIAGNOSIS — M25569 Pain in unspecified knee: Secondary | ICD-10-CM | POA: Insufficient documentation

## 2022-01-06 DIAGNOSIS — Z7982 Long term (current) use of aspirin: Secondary | ICD-10-CM | POA: Insufficient documentation

## 2022-01-06 DIAGNOSIS — G9520 Unspecified cord compression: Secondary | ICD-10-CM

## 2022-01-06 DIAGNOSIS — M25512 Pain in left shoulder: Secondary | ICD-10-CM | POA: Insufficient documentation

## 2022-01-06 DIAGNOSIS — R739 Hyperglycemia, unspecified: Secondary | ICD-10-CM | POA: Insufficient documentation

## 2022-01-06 DIAGNOSIS — M25511 Pain in right shoulder: Secondary | ICD-10-CM | POA: Insufficient documentation

## 2022-01-06 LAB — CBC
HCT: 43.2 % (ref 39.0–52.0)
Hemoglobin: 14.9 g/dL (ref 13.0–17.0)
MCH: 31.9 pg (ref 26.0–34.0)
MCHC: 34.5 g/dL (ref 30.0–36.0)
MCV: 92.5 fL (ref 80.0–100.0)
Platelets: 222 10*3/uL (ref 150–400)
RBC: 4.67 MIL/uL (ref 4.22–5.81)
RDW: 12.7 % (ref 11.5–15.5)
WBC: 6.7 10*3/uL (ref 4.0–10.5)
nRBC: 0 % (ref 0.0–0.2)

## 2022-01-06 LAB — BASIC METABOLIC PANEL
Anion gap: 8 (ref 5–15)
BUN: 17 mg/dL (ref 8–23)
CO2: 25 mmol/L (ref 22–32)
Calcium: 9.4 mg/dL (ref 8.9–10.3)
Chloride: 106 mmol/L (ref 98–111)
Creatinine, Ser: 0.6 mg/dL — ABNORMAL LOW (ref 0.61–1.24)
GFR, Estimated: >60 mL/min (ref >60)
Glucose, Bld: 149 mg/dL — ABNORMAL HIGH (ref 70–99)
Potassium: 3.5 mmol/L (ref 3.5–5.1)
Sodium: 139 mmol/L (ref 135–145)

## 2022-01-06 MED ORDER — ACETAMINOPHEN 325 MG PO TABS
650.0000 mg | ORAL_TABLET | Freq: Once | ORAL | Status: AC
  Administered 2022-01-06: 650 mg via ORAL
  Filled 2022-01-06: qty 2

## 2022-01-06 MED ORDER — HYDROCODONE-ACETAMINOPHEN 5-325 MG PO TABS: 1.0000 | ORAL_TABLET | Freq: Four times a day (QID) | ORAL | 0 refills | Status: DC | PRN

## 2022-01-06 NOTE — Discharge Instructions (Addendum)
1. Regrese al Departamento de Emergencias si tiene alguna inquietud nueva. 2. Por favor no levante objetos pesados ni regrese al [facility] hasta el lunes cuando visite neurociruga. 3. Por favor ver nota de trabajo adjunta 4. Por favor recoja los medicamentos recetados que le he enviado. Tmelos segn las indicaciones para Conservation officer, historic buildings.

## 2022-01-06 NOTE — ED Provider Notes (Signed)
  Physical Exam  BP 130/88   Pulse 88   Temp 98 F (36.7 C)   Resp 16   Wt 72.6 kg   SpO2 98%   Physical Exam Vitals and nursing note reviewed.  Constitutional:      General: He is not in acute distress.    Appearance: He is well-developed.  HENT:     Head: Normocephalic and atraumatic.  Eyes:     Conjunctiva/sclera: Conjunctivae normal.  Cardiovascular:     Rate and Rhythm: Normal rate and regular rhythm.     Heart sounds: No murmur heard. Pulmonary:     Effort: Pulmonary effort is normal. No respiratory distress.     Breath sounds: Normal breath sounds.  Abdominal:     Palpations: Abdomen is soft.     Tenderness: There is no abdominal tenderness.  Musculoskeletal:        General: No swelling.     Cervical back: Neck supple.  Skin:    General: Skin is warm and dry.     Capillary Refill: Capillary refill takes less than 2 seconds.  Neurological:     Mental Status: He is alert.  Psychiatric:        Mood and Affect: Mood normal.     Procedures  Procedures  ED Course / MDM    Medical Decision Making Amount and/or Complexity of Data Reviewed Labs: ordered. Radiology: ordered.  Risk OTC drugs. Prescription drug management.   Patient signed out to me at shift change.  Please see previous provider note for further details.  In short, this is a 61 year old male presents bilateral shoulder pain, knee pain, lower leg swelling for the last several weeks.  Patient reports recent jackhammer use due to his job.  Patient having numbness, weakness and lack of grip strength in his bilateral shoulders.  Patient signed out to me pending MRI, neurosurgery consultation.  MRI shows multiple areas of stenosis of the cervical spine.  On-call neurosurgeon provider Charolette Child, NP has advised me that this patient can follow-up on Monday in the office.  The patient will be directed cannot return to work or do any work-related activities until he has been seen by neurosurgery.   Patient voices understanding of my instructions.  Patient discharge instructions were given utilizing translator.  The patient voiced understanding of all my instructions.  The patient had all of his questions answered to his satisfaction.  The patient stable at this time for discharge home.       Azucena Cecil, PA-C 01/06/22 1648    Ezequiel Essex, MD 01/07/22 1122

## 2022-01-06 NOTE — ED Triage Notes (Signed)
Pt reports pain and swelling to bilateral lower extremities and bilateral shoulder pain x2 weeks. Ibuprofen with no relief. Denies fall/injury.

## 2022-01-06 NOTE — ED Provider Notes (Addendum)
[facility] DEPT Provider Note   CSN: [id number] Arrival date & time: 01/06/22  1103     History  Chief Complaint  Patient presents with   Leg Pain   Shoulder Pain    [name] is a 61 y.o. male with no contributory past medical history on file who presents with concern for bilateral shoulder pain, knee pain, and lower leg swelling versus lesions for the last several weeks.  Patient reports that he was using a jackhammer or some other power tool and then has been having some numbness, weakness, lack of grip strength, and significant pain in bilateral shoulders.  He denies significant pain in the neck, recent fall or other injury.  He denies any previous history of IV drug use, cancer, previous surgery on the cervical, lumbar, or thoracic spines.  Patient denies any bladder or bowel incontinence.  He denies any vision changes, unilateral numbness, weakness.   Leg Pain Shoulder Pain      Home Medications Prior to Admission medications   Medication Sig Start Date End Date Taking? Authorizing Provider  aspirin EC 81 MG tablet Take 81 mg by mouth daily.    [provider]  HYDROcodone-acetaminophen (NORCO/VICODIN) 5-325 MG per tablet Take 2 tablets by mouth every 6 (six) hours as needed for severe pain. 02/03/13   Cleatrice Burke, PA-C  ondansetron (ZOFRAN) 4 MG tablet Take 1 tablet (4 mg total) by mouth every 6 (six) hours. 02/03/13   Cleatrice Burke, PA-C  ranitidine (ZANTAC) 150 MG capsule Take 1 capsule (150 mg total) by mouth daily. 02/03/13   Cleatrice Burke, PA-C      Allergies    Patient has no known allergies.    Review of Systems   Review of Systems  All other systems reviewed and are negative.   Physical Exam Updated Vital Signs BP 130/88   Pulse 88   Temp 98 F (36.7 C)   Resp 16   Wt 72.6 kg   SpO2 98%  Physical Exam Vitals and nursing note reviewed.  Constitutional:      General: He is not in acute distress.     Appearance: Normal appearance.  HENT:     Head: Normocephalic and atraumatic.  Eyes:     General:        Right eye: No discharge.        Left eye: No discharge.  Cardiovascular:     Rate and Rhythm: Normal rate and regular rhythm.     Heart sounds: No murmur heard.    No friction rub. No gallop.  Pulmonary:     Effort: Pulmonary effort is normal.     Breath sounds: Normal breath sounds.  Abdominal:     General: Bowel sounds are normal.     Palpations: Abdomen is soft.  Musculoskeletal:     Comments: Patient with some tenderness to palpation around the cervical spine and paraspinous muscles, but without significant midline tenderness, no tenderness in thoracic or lumbar spines.  He is intact strength at least 4 out of 5 if not 5 out of 5 bilateral upper extremities, I do not perceive a significant strength deficit bilaterally, however patient reports that he feels significantly weaker especially to grip strength.  Normal sensation subjectively abdomen, lower extremities, intact strength 5/5 bilateral lower extremities, normal coordination throughout.  Normal deep tendon reflexes 2+ in lower extremities.  Skin:    General: Skin is warm and dry.     Capillary Refill: Capillary refill takes less  than 2 seconds.  Neurological:     Mental Status: He is alert and oriented to person, place, and time.  Psychiatric:        Mood and Affect: Mood normal.        Behavior: Behavior normal.     ED Results / Procedures / Treatments   Labs (all labs ordered are listed, but only abnormal results are displayed) Labs Reviewed  BASIC METABOLIC PANEL - Abnormal; Notable for the following components:      Result Value   Glucose, Bld 149 (*)    Creatinine, Ser 0.60 (*)    All other components within normal limits  CBC    EKG None  Radiology MR Cervical Spine Wo Contrast  Result Date: 01/06/2022 CLINICAL DATA:  Neck pain with bilateral shoulder pain. No recent injury EXAM: MRI CERVICAL  SPINE WITHOUT CONTRAST TECHNIQUE: Multiplanar, multisequence MR imaging of the cervical spine was performed. No intravenous contrast was administered. COMPARISON:  X-ray 12/26/2011 FINDINGS: Alignment: Straightening of the cervical lordosis. Grade 1 anterolisthesis of C7 on T1. Vertebrae: No fracture, evidence of discitis, or bone lesion. Inferior endplate Schmorl's node at C3. Mild reactive subchondral marrow edema associated with the left C3-4 facet joint. Cord: Subtly increased T2 signal within the cord spanning from C3-C5. Cord is slightly atrophic at these levels, particularly at the C3-4 intervertebral disc level. Additional site of subtle cord signal change at C7-T1. No syrinx. Posterior Fossa, vertebral arteries, paraspinal tissues: Negative. Disc levels: C2-C3: No disc protrusion. Left greater than right facet arthropathy and mild uncovertebral spurring. Mild-to-moderate left foraminal stenosis. No canal stenosis. C3-C4: Disc osteophyte complex with left greater than right facet and uncovertebral arthropathy. Severe canal stenosis with compression of the cervical cord. Severe bilateral foraminal stenosis. C4-C5: Disc osteophyte complex resulting in moderate-severe canal stenosis with mild impress upon the cervical cord. Bilateral facet and uncovertebral arthropathy contribute to severe left and moderate-severe right foraminal stenosis. C5-C6: Disc osteophyte complex with bilateral facet and uncovertebral arthropathy. Moderate to severe canal stenosis with severe right and moderate-severe left foraminal stenosis. C6-C7: Disc osteophyte complex with bilateral facet and uncovertebral arthropathy resulting in moderate to severe canal stenosis with severe bilateral foraminal stenosis. C7-T1: Disc osteophyte complex with bilateral facet and uncovertebral arthropathy resulting in severe canal stenosis with severe bilateral foraminal stenosis. IMPRESSION: 1. Advanced multilevel degenerative changes of the cervical  spine. 2. 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 3. Moderate-severe canal stenosis at C4-5 through C6-7. 4. Severe bilateral foraminal stenosis at multiple levels, as above. Electronically Signed   By: Davina Poke D.O.   On: 01/06/2022 15:14   DG Knee Complete 4 Views Left  Result Date: 01/06/2022 CLINICAL DATA:  Bilateral shoulder and diffuse left knee pain for 2 weeks. No known injury. EXAM: LEFT KNEE - COMPLETE 4+ VIEW COMPARISON:  Left knee radiographs 01/23/2011. FINDINGS: Significantly progressive tricompartmental degenerative changes are demonstrated, most advanced in the medial and patellofemoral compartments. There are associated osteophytes and a moderate to large joint effusion. No evidence of acute fracture or dislocation. Femoropopliteal atherosclerosis noted. 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 Electronically Signed   By: Richardean Sale M.D.   On: 01/06/2022 14:36   DG Shoulder Left  Result Date: 01/06/2022 CLINICAL DATA:  Bilateral shoulder and diffuse left knee pain for 2  weeks. No known injury. EXAM: LEFT SHOULDER - 2+ VIEW COMPARISON:  None Available. FINDINGS: AP and Y-views are submitted. The mineralization and alignment are normal. There is no evidence of acute fracture or dislocation. The joint spaces appear preserved. No soft tissue abnormalities are identified. IMPRESSION: No acute osseous findings or significant arthropathic changes. Electronically Signed   By: Richardean Sale M.D.   On: 01/06/2022 14:35   DG Shoulder Right  Result Date: 01/06/2022 CLINICAL DATA:  Bilateral shoulder and diffuse left knee pain for 2 weeks. No known injury. EXAM: RIGHT SHOULDER - 2+ VIEW COMPARISON:  None Available. FINDINGS: AP and  Y-views are submitted. The mineralization and alignment are normal. There is no evidence of acute fracture or dislocation. There is glenohumeral joint space narrowing. Minimal acromioclavicular degenerative changes are present. The subacromial space appears preserved. The soft tissues appear unremarkable. IMPRESSION: Glenohumeral and acromioclavicular degenerative changes. No acute osseous findings. Electronically Signed   By: Richardean Sale M.D.   On: 01/06/2022 14:34    Procedures Procedures    Medications Ordered in ED Medications  acetaminophen (TYLENOL) tablet 650 mg (650 mg Oral Given 01/06/22 1401)    ED Course/ Medical Decision Making/ A&P                           Medical Decision Making Amount and/or Complexity of Data Reviewed Labs: ordered. Radiology: ordered.  Risk OTC drugs.   This patient is a 61 y.o. male who presents to the ED for concern of bilateral shoulder pain, numbness, weakness, cold sensation, as well as left-sided knee pain, gives a, this involves an extensive number of treatment options, and is a complaint that carries with it a high risk of complications and morbidity. The emergent differential diagnosis prior to evaluation includes, but is not limited to,  cervical spinal pathology, shoulder arthropathy, knee arthropathy, acute fracture dislocation, other spinal cord pathology, vs other.   This is not an exhaustive differential.   Physical Exam: Physical exam performed. The pertinent findings include: questionable weakness of bilateral upper extremities, but no objective obvious changes, left knee tenderness to palpation with normal strength and rom. Unclear what patient is referring to when indicating his skin changes  Lab Tests: I ordered, and personally interpreted labs.  The pertinent results include:  unremarkable cbc, bmp other than mild hyperglycemia   Imaging Studies: I ordered imaging studies including plain film radiographs of left knee, left  shoulder, right shoulder. I independently visualized and interpreted imaging which showed arthropathy of left knee with mild effusion, left shoulder unremarkable, right shoulder with some degenerative changes. MR C spine shows severe canal stenosis with cord compression as well as some other degenerative changes. I agree with the radiologist interpretation.  We will consult neurosurgery.   Medications: I ordered medication including tylenol  for pain. Reevaluation of the patient after these medicines showed that the patient improved. I have reviewed the patients home medicines and have made adjustments as needed.   3:41 PM Care of [name] transferred to [facility] and Dr. Wyvonnia Dusky at the end of my shift as the patient will require reassessment once labs/imaging have resulted. Patient presentation, ED course, and plan of care discussed with review of all pertinent labs and imaging. Please see his/her note for further details regarding further ED course and disposition. Plan at time of handoff is consult with neurosurgery given his cervical canal stenosis and compression. This may be altered or completely changed  at the discretion of the oncoming team pending results of further workup.    Final Clinical Impression(s) / ED Diagnoses Final diagnoses:  None    Rx / DC Orders ED Discharge Orders     None         Anselmo Pickler, PA-C 01/06/22 1515    Malvin Johns, MD 01/06/22 1521    Samhitha Rosen, Regina H, PA-C 01/06/22 1541    Malvin Johns, MD 01/07/22 [phone]

## 2022-01-13 ENCOUNTER — Emergency Department (HOSPITAL_COMMUNITY)
Admission: EM | Admit: 2022-01-13 | Discharge: 2022-01-13 | Disposition: A | Discharge status: Home / Self Care | Payer: Self-pay | Attending: Emergency Medicine | Admitting: Emergency Medicine

## 2022-01-13 ENCOUNTER — Encounter (HOSPITAL_COMMUNITY): Payer: Self-pay | Admitting: Emergency Medicine

## 2022-01-13 ENCOUNTER — Other Ambulatory Visit: Payer: Self-pay

## 2022-01-13 DIAGNOSIS — Z7982 Long term (current) use of aspirin: Secondary | ICD-10-CM | POA: Insufficient documentation

## 2022-01-13 DIAGNOSIS — R739 Hyperglycemia, unspecified: Secondary | ICD-10-CM

## 2022-01-13 DIAGNOSIS — R42 Dizziness and giddiness: Secondary | ICD-10-CM | POA: Insufficient documentation

## 2022-01-13 DIAGNOSIS — Z7984 Long term (current) use of oral hypoglycemic drugs: Secondary | ICD-10-CM | POA: Insufficient documentation

## 2022-01-13 DIAGNOSIS — E1165 Type 2 diabetes mellitus with hyperglycemia: Secondary | ICD-10-CM | POA: Insufficient documentation

## 2022-01-13 DIAGNOSIS — F17210 Nicotine dependence, cigarettes, uncomplicated: Secondary | ICD-10-CM | POA: Insufficient documentation

## 2022-01-13 DIAGNOSIS — M791 Myalgia, unspecified site: Secondary | ICD-10-CM | POA: Insufficient documentation

## 2022-01-13 LAB — URINALYSIS, ROUTINE W REFLEX MICROSCOPIC
Bacteria, UA: NONE SEEN
Bilirubin Urine: NEGATIVE
Glucose, UA: >=500 mg/dL — AB
Hgb urine dipstick: NEGATIVE
Ketones, ur: NEGATIVE mg/dL
Leukocytes,Ua: NEGATIVE
Nitrite: NEGATIVE
Protein, ur: NEGATIVE mg/dL
Specific Gravity, Urine: 1.011 (ref 1.005–1.030)
pH: 6 (ref 5.0–8.0)

## 2022-01-13 LAB — COMPREHENSIVE METABOLIC PANEL
ALT: 17 U/L (ref 0–44)
AST: 21 U/L (ref 15–41)
Albumin: 3.9 g/dL (ref 3.5–5.0)
Alkaline Phosphatase: 81 U/L (ref 38–126)
Anion gap: 9 (ref 5–15)
BUN: 15 mg/dL (ref 8–23)
CO2: 23 mmol/L (ref 22–32)
Calcium: 9 mg/dL (ref 8.9–10.3)
Chloride: 103 mmol/L (ref 98–111)
Creatinine, Ser: 0.7 mg/dL (ref 0.61–1.24)
GFR, Estimated: >60 mL/min (ref >60)
Glucose, Bld: 265 mg/dL — ABNORMAL HIGH (ref 70–99)
Potassium: 4 mmol/L (ref 3.5–5.1)
Sodium: 135 mmol/L (ref 135–145)
Total Bilirubin: 1.2 mg/dL (ref 0.3–1.2)
Total Protein: 7.3 g/dL (ref 6.5–8.1)

## 2022-01-13 LAB — CBC
HCT: 42.1 % (ref 39.0–52.0)
Hemoglobin: 14.1 g/dL (ref 13.0–17.0)
MCH: 31.6 pg (ref 26.0–34.0)
MCHC: 33.5 g/dL (ref 30.0–36.0)
MCV: 94.4 fL (ref 80.0–100.0)
Platelets: 220 10*3/uL (ref 150–400)
RBC: 4.46 MIL/uL (ref 4.22–5.81)
RDW: 12.4 % (ref 11.5–15.5)
WBC: 7 10*3/uL (ref 4.0–10.5)
nRBC: 0 % (ref 0.0–0.2)

## 2022-01-13 LAB — CK: Total CK: 98 U/L (ref 49–397)

## 2022-01-13 LAB — CBG MONITORING, ED: Glucose-Capillary: 267 mg/dL — ABNORMAL HIGH (ref 70–99)

## 2022-01-13 MED ORDER — GABAPENTIN 100 MG PO CAPS: 100.0000 mg | ORAL_CAPSULE | Freq: Three times a day (TID) | ORAL | 0 refills | Status: DC

## 2022-01-13 MED ORDER — METFORMIN HCL 500 MG PO TABS: 500.0000 mg | ORAL_TABLET | Freq: Two times a day (BID) | ORAL | 1 refills | Status: DC

## 2022-01-13 MED ORDER — SODIUM CHLORIDE 0.9 % IV BOLUS
1000.0000 mL | Freq: Once | INTRAVENOUS | Status: AC
  Administered 2022-01-13: 1000 mL via INTRAVENOUS

## 2022-01-13 NOTE — ED Triage Notes (Signed)
Pt reports muscle cramps in bilateral legs & arms x 1 month. Pt reports working in [employer].

## 2022-01-13 NOTE — Discharge Instructions (Addendum)
Please take Metformin for your diabetes.  Take gabapentin for pain.  Call and follow up with a primary care provider for further care of your health.

## 2022-01-13 NOTE — ED Provider Notes (Signed)
[facility]-EMERGENCY DEPT Provider Note   CSN: [id number] Arrival date & time: 01/13/22  1034     History  Chief Complaint  Patient presents with   Muscle Pain    [name] is a 61 y.o. male.  The history is provided by the patient and medical records. The history is limited by a language barrier. A language interpreter was used.  Muscle Pain     61 year old Hispanic speaking male presenting complaining of muscle pain.  Patient report for the past 2 to 3 weeks he has had progressive worsening aches and pain throughout his body.  Pain is involving his shoulders, chest, legs, thigh, waxing waning but last throughout the day.  He feels fatigue, and occasionally endorse some dizziness.  He is working in [employer] and is walking only a few days a week now due to his discomfort.  He does not endorse any fever or chills no nausea vomiting or diarrhea no urinary symptoms.  He does notice darker urine but not cola colored urine.  No other bowel bladder changes.  He smokes cigarettes on occasion, drink alcohol on occasion.  He denies taking any regular prescribed medication except for some pain medication from [place].  He was seen a little over a week ago for aches and pain and states he had x-ray and MRI done and he was given Tylenol which did not provide any significant relief.  Home Medications Prior to Admission medications   Medication Sig Start Date End Date Taking? Authorizing Provider  aspirin EC 81 MG tablet Take 81 mg by mouth daily.    [provider]  HYDROcodone-acetaminophen (NORCO/VICODIN) 5-325 MG tablet Take 1 tablet by mouth every 6 (six) hours as needed for moderate pain. 01/06/22   Al Decant, PA-C  ondansetron (ZOFRAN) 4 MG tablet Take 1 tablet (4 mg total) by mouth every 6 (six) hours. 02/03/13   Junious Silk, PA-C  ranitidine (ZANTAC) 150 MG capsule Take 1 capsule (150 mg total) by mouth daily. 02/03/13   Junious Silk, PA-C       Allergies    Patient has no known allergies.    Review of Systems   Review of Systems  All other systems reviewed and are negative.   Physical Exam Updated Vital Signs BP 135/70   Pulse 86   Temp 98.3 F (36.8 C) (Oral)   Resp 19   Ht 5\' 4"  (1.626 m)   Wt 72.6 kg   SpO2 98%   BMI 27.46 kg/m  Physical Exam Vitals and nursing note reviewed.  Constitutional:      General: He is not in acute distress.    Appearance: He is well-developed.  HENT:     Head: Atraumatic.  Eyes:     Conjunctiva/sclera: Conjunctivae normal.  Cardiovascular:     Rate and Rhythm: Normal rate and regular rhythm.     Pulses: Normal pulses.     Heart sounds: Normal heart sounds.  Pulmonary:     Effort: Pulmonary effort is normal.     Breath sounds: Normal breath sounds.  Abdominal:     Palpations: Abdomen is soft.     Tenderness: There is no abdominal tenderness.  Musculoskeletal:        General: Tenderness (Mild generalized tenderness noted to palpation of bilateral arms and legs and chest wall without focal point tenderness or overlying skin changes.  Intact distal pulses throughout with equal strength throughout.) present.     Cervical back: Neck supple.  Skin:  Findings: No rash.  Neurological:     Mental Status: He is alert.     ED Results / Procedures / Treatments   Labs (all labs ordered are listed, but only abnormal results are displayed) Labs Reviewed  COMPREHENSIVE METABOLIC PANEL - Abnormal; Notable for the following components:      Result Value   Glucose, Bld 265 (*)    All other components within normal limits  CBG MONITORING, ED - Abnormal; Notable for the following components:   Glucose-Capillary 267 (*)    All other components within normal limits  CBC  CK  URINALYSIS, ROUTINE W REFLEX MICROSCOPIC    EKG None  Date: 01/13/2022  Rate: 69  Rhythm: normal sinus rhythm  QRS Axis: normal  Intervals: normal  ST/T Wave abnormalities: normal  Conduction  Disutrbances: none  Narrative Interpretation:   Old EKG Reviewed: No significant changes noted    Radiology No results found.  Procedures Procedures    Medications Ordered in ED Medications  sodium chloride 0.9 % bolus 1,000 mL (0 mLs Intravenous Stopped 01/13/22 1228)    ED Course/ Medical Decision Making/ A&P                           Medical Decision Making Amount and/or Complexity of Data Reviewed Labs: ordered.   BP 135/70   Pulse 86   Temp 98.3 F (36.8 C) (Oral)   Resp 19   Ht 5\' 4"  (1.626 m)   Wt 72.6 kg   SpO2 98%   BMI 27.46 kg/m   33:58 AM 61 year old Hispanic speaking male presenting complaining of muscle pain.  Patient report for the past 2 to 3 weeks he has had progressive worsening aches and pain throughout his body.  Pain is involving his shoulders, chest, legs, thigh, waxing waning but last throughout the day.  He feels fatigue, and occasionally endorse some dizziness.  He is working in [employer] and is working only a few days a week now due to his discomfort.  He does not endorse any fever or chills no nausea vomiting or diarrhea no urinary symptoms.  He does notice darker urine but not cola colored urine.  No other bowel bladder changes.  He smokes cigarettes on occasion, drink alcohol on occasion.  He denies taking any regular prescribed medication except for some pain medication from [employer].  He was seen a little over a week ago for aches and pain and states he had x-ray and MRI done and he was given Tylenol which did not provide any significant relief.  On exam this is a well-appearing male sitting in the chair appears to be in no acute discomfort.  He has mild generalized tenderness to palpation of his arms and legs and chest without focal point tenderness or overlying skin changes.  He is neurovascular intact.  He ambulate without difficulty.  He does not have any significant peripheral edema.  Vital signs reviewed and are unremarkable.  He is  currently not on any medication that can cause rhabdomyolysis or myalgia.  However, given his presentation, will obtain labs, give IV fluid, check UA, and will continue to monitor.  12:34 PM I review patient's EMR including prior labs and appropriate imaging study.  I also viewed and interpreted today's labs, EKG and imaging and I agree with radiologist's interpretation.  Labs remarkable for elevated CBG of 265 consistent with diabetes.  Normal CK low suspicion for rhabdomyolysis or myalgia.  EKG without concerning  ischemic changes.  Patient states he is aware that he has diabetes.  He is not on medication.  He will be appropriate to be treated for his diabetes with metformin.  We will also prescribe gabapentin for his pain.  Recommend outpatient follow-up with PCP for further care.  He will also need to follow-up with neurosurgery for his spinal stenosis that was recently diagnosed during his last ER visit over a week ago.  Hospital admission considered but felt inappropriate in the setting of unremarkable blood work and patient overall well-appearing.  I have low suspicion for DVT causing his leg pain.  I gave patient IV fluid in the ED with improvement of symptoms.        Final Clinical Impression(s) / ED Diagnoses Final diagnoses:  Muscle ache  Hyperglycemia    Rx / DC Orders ED Discharge Orders          Ordered    metFORMIN (GLUCOPHAGE) 500 MG tablet  2 times daily with meals        01/13/22 1253    gabapentin (NEURONTIN) 100 MG capsule  3 times daily        01/13/22 1253              Domenic Moras, PA-C 01/13/22 1255    Isla Pence, MD 01/13/22 1315

## 2022-02-07 ENCOUNTER — Encounter: Payer: Self-pay | Admitting: Nurse Practitioner

## 2022-02-07 ENCOUNTER — Other Ambulatory Visit: Payer: Self-pay

## 2022-02-07 ENCOUNTER — Ambulatory Visit (INDEPENDENT_AMBULATORY_CARE_PROVIDER_SITE_OTHER): Payer: Self-pay | Admitting: Nurse Practitioner

## 2022-02-07 VITALS — BP 130/66 | HR 76 | Temp 97.7°F | Wt 147.8 lb

## 2022-02-07 DIAGNOSIS — Z1211 Encounter for screening for malignant neoplasm of colon: Secondary | ICD-10-CM

## 2022-02-07 DIAGNOSIS — E118 Type 2 diabetes mellitus with unspecified complications: Secondary | ICD-10-CM | POA: Insufficient documentation

## 2022-02-07 DIAGNOSIS — M4802 Spinal stenosis, cervical region: Secondary | ICD-10-CM

## 2022-02-07 DIAGNOSIS — M19011 Primary osteoarthritis, right shoulder: Secondary | ICD-10-CM

## 2022-02-07 DIAGNOSIS — K5900 Constipation, unspecified: Secondary | ICD-10-CM | POA: Insufficient documentation

## 2022-02-07 DIAGNOSIS — E1165 Type 2 diabetes mellitus with hyperglycemia: Secondary | ICD-10-CM

## 2022-02-07 DIAGNOSIS — G8929 Other chronic pain: Secondary | ICD-10-CM

## 2022-02-07 DIAGNOSIS — E782 Mixed hyperlipidemia: Secondary | ICD-10-CM

## 2022-02-07 DIAGNOSIS — I10 Essential (primary) hypertension: Secondary | ICD-10-CM

## 2022-02-07 DIAGNOSIS — M25561 Pain in right knee: Secondary | ICD-10-CM

## 2022-02-07 DIAGNOSIS — E785 Hyperlipidemia, unspecified: Secondary | ICD-10-CM | POA: Insufficient documentation

## 2022-02-07 HISTORY — DX: Essential (primary) hypertension: I10

## 2022-02-07 LAB — POCT GLYCOSYLATED HEMOGLOBIN (HGB A1C): Hemoglobin A1C: 7 % — AB (ref 4.0–5.6)

## 2022-02-07 MED ORDER — POLYETHYLENE GLYCOL 3350 17 GM/SCOOP PO POWD: 17.0000 g | Freq: Every day | ORAL | 2 refills | Status: DC | PRN

## 2022-02-07 MED ORDER — LISINOPRIL 2.5 MG PO TABS: 2.5000 mg | ORAL_TABLET | Freq: Every day | ORAL | 0 refills | Status: DC

## 2022-02-07 MED ORDER — GABAPENTIN 100 MG PO CAPS: 100.0000 mg | ORAL_CAPSULE | Freq: Three times a day (TID) | ORAL | 2 refills | Status: DC

## 2022-02-07 MED ORDER — IBUPROFEN 600 MG PO TABS: 600.0000 mg | ORAL_TABLET | Freq: Three times a day (TID) | ORAL | 0 refills | Status: DC | PRN

## 2022-02-07 NOTE — Progress Notes (Signed)
New Patient Office Visit  Subjective:  Patient ID: [name], male    DOB: 1960/03/22  Age: 61 y.o. MRN: [id number]  CC:  Chief Complaint  Patient presents with   Establish Care    Pt feels weak and had   a large weight loss in one month     HPI [name] is a 61 y.o. male with past medical history of type 2 diabetes, hyperlipidemia presents to establish care for his chronic medical conditions. No previous PCP. He was seen in the ER about a month ago for complaints of generalized body aches involving both shoulders, chest and legs . Today he complains of bilateral shoulder pain, right leg and right knee. Also has tingling and numbness in the joints and abdomen. He denies recent trauma, has been taking gabapentin 100mg  BID. He currently denies CP, wheezing, SOB, dizziness. States that his symptoms have been going on for  about a  month  Has constipated, denies nausea, vomitting, bloody stool. Goes 4-5 days without a bowel. Takes milk of magnesium as needed. He has never had colon cancer screening.   T2DM . Started taking Metformin 500mg  BID since his last visit to the ER, prior to that time metformin was last taken  in 2020. He denies polyphagia, polyuria, polydipsia.   He Smokes less than one pack of cigarettes daily. Denies CP, SOB, wheezing.    Interpretation services provided via [facility].     Past Medical History:  Diagnosis Date   Diabetes mellitus without complication ([place])     No past surgical history on file.  Family History  Problem Relation Age of Onset   Diabetes Father    Colon cancer Neg Hx    Prostate cancer Neg Hx     Social History   Socioeconomic History   Marital status: Single    Spouse name: Not on file   Number of children: 9   Years of education: Not on file   Highest education level: Not on file  Occupational History   Not on file  Tobacco Use   Smoking status: Never   Smokeless tobacco: Current  Substance and Sexual Activity    Alcohol use: Yes    Alcohol/week: 2.0 standard drinks of alcohol    Types: 2 Cans of beer per week    Comment: occas   Drug use: Not Currently    Comment: ued to smoke weed   Sexual activity: Not on file  Other Topics Concern   Not on file  Social History Narrative   Lives alone with a room mate .    Social Determinants of Health   Financial Resource Strain: Not on file  Food Insecurity: Not on file  Transportation Needs: Not on file  Physical Activity: Not on file  Stress: Not on file  Social Connections: Not on file  Intimate Partner Violence: Not on file    ROS Review of Systems  Constitutional:  Negative for activity change, appetite change, chills and diaphoresis.  Respiratory:  Negative for chest tightness and wheezing.   Cardiovascular:  Negative for chest pain, palpitations and leg swelling.  Gastrointestinal:  Positive for constipation. Negative for abdominal distention, abdominal pain, anal bleeding, blood in stool and nausea.  Genitourinary:  Negative for difficulty urinating, dysuria, flank pain and frequency.  Musculoskeletal:  Positive for arthralgias, gait problem and myalgias.  Neurological:  Positive for numbness. Negative for tremors, seizures, syncope, facial asymmetry, speech difficulty, light-headedness and headaches.  Psychiatric/Behavioral:  Negative for  hallucinations, self-injury, sleep disturbance and suicidal ideas. The patient is not hyperactive.     Objective:   Today's Vitals: BP 130/66   Pulse 76   Temp 97.7 F (36.5 C)   Wt 147 lb 12.8 oz (67 kg)   SpO2 100%   BMI 25.37 kg/m   Physical Exam Constitutional:      General: He is not in acute distress.    Appearance: He is not ill-appearing, toxic-appearing or diaphoretic.  Eyes:     General: No scleral icterus.       Right eye: No discharge.        Left eye: No discharge.     Extraocular Movements: Extraocular movements intact.     Conjunctiva/sclera: Conjunctivae normal.      Pupils: Pupils are equal, round, and reactive to light.  Cardiovascular:     Rate and Rhythm: Normal rate and regular rhythm.     Pulses: Normal pulses.     Heart sounds: Normal heart sounds. No murmur heard.    No friction rub. No gallop.  Pulmonary:     Effort: No respiratory distress.     Breath sounds: No stridor. No wheezing, rhonchi or rales.  Chest:     Chest wall: No tenderness.  Abdominal:     General: There is no distension.     Palpations: Abdomen is soft. There is no mass.     Tenderness: There is no abdominal tenderness. There is no right CVA tenderness, left CVA tenderness, guarding or rebound.     Hernia: No hernia is present.     Comments: Hypoactive BS  Musculoskeletal:        General: No swelling, tenderness or signs of injury.     Right lower leg: No edema.     Left lower leg: No edema.     Comments: Limited ROM of bilateral shoulder and left knee , has subjective numbness, skin warm and dry , no swelling or redness noted. Strength 5/5 bilateral upper extremities and bilateral lower extremities. Using a cane for support today , radial pulses +2 bilaterally , diminished  pedal pulses bilaterally   Skin:    Capillary Refill: Capillary refill takes less than 2 seconds.  Neurological:     Motor: No weakness.     Coordination: Coordination normal.     Gait: Gait abnormal.  Psychiatric:        Mood and Affect: Mood normal.        Behavior: Behavior normal.        Thought Content: Thought content normal.        Judgment: Judgment normal.     Assessment & Plan:   Problem List Items Addressed This Visit       Cardiovascular and Mediastinum   Primary hypertension    BP Readings from Last 3 Encounters:  02/07/22 130/66  01/13/22 138/88  01/06/22 138/82  Start lisinopril 2.5 mg daily due yo his T2DM Dash diet advised, exercise daily at least 150 minutes as tolerated.  Follow up in 4 weeks        Relevant Medications   lisinopril (ZESTRIL) 2.5 MG tablet      Endocrine   Type 2 diabetes mellitus with hyperglycemia ([place]) - Primary    Lab Results  Component Value Date   HGBA1C 7.0 (A) 02/07/2022  He started taking metformin 500mg  BID since about a month ago.  Pt counseled on low carb diet.  Started on lisinopril 2.5mg  daily for kidney protection, check BMP in  4 weeks  Diabetic foot exam completed, will discuss referral for eye exam at next visit Patient will return to the office tomorrow for fasting labs, UACR       Relevant Medications   lisinopril (ZESTRIL) 2.5 MG tablet   Other Relevant Orders   Lipid Panel   Urine microalbumin-creatinine with uACR   POCT glycosylated hemoglobin (Hb A1C) (Completed)     Musculoskeletal and Integument   Primary osteoarthritis of right shoulder     - ibuprofen (ADVIL) 600 MG tablet; Take 1 tablet (600 mg total) by mouth every 8 (eight) hours as needed.  Dispense: 30 tablet; Refill: 0 - gabapentin (NEURONTIN) 100 MG capsule; Take 1 capsule (100 mg total) by mouth 3 (three) times daily.  Dispense: 90 capsule; Refill: 2        Relevant Medications   ibuprofen (ADVIL) 600 MG tablet     Other   Constipation    Chronic condition RX miralax 17g daily PRN Increase fiber , drink at least 64 ounces of water daily to maintain hydration.  Cologuard ordered  since he is due for colon cancer screening.       Relevant Medications   polyethylene glycol powder (GLYCOLAX/MIRALAX) 17 GM/SCOOP powder   Chronic pain of right knee    Most likely due to arthritis. Degenerative changes noted on recent imaging of the  left knee RX ibuprofen 800mg  TID PRN Will refer to othopedics if needed.       Relevant Medications   ibuprofen (ADVIL) 600 MG tablet   gabapentin (NEURONTIN) 100 MG capsule   Cervical stenosis of spine    Noted on recent imaging, will refer patient to neurosurgeon as recommended by ER doctor Continue gabapentin 100mg  TID RX ibuprofen 800mg  TID pain.  .   Advanced multilevel degenerative  changes of the cervical spine. 2. 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 3. Moderate-severe canal stenosis at C4-5 through C6-7. 4. Severe bilateral foraminal stenosis at multiple levels, as above.      Relevant Orders   Ambulatory referral to Neurosurgery   Mixed hyperlipidemia    Lab Results  Component Value Date   CHOL (H) 05/11/2010    308        ATP III CLASSIFICATION:  <200     mg/dL   Desirable   mg/dL   Borderline High     mg/dL   High          HDL NOT REPORTED DUE TO HIGH TRIGLYCERIDES 05/11/2010   LDLCALC UNABLE TO CALCULATE IF TRIGLYCERIDE OVER 400 mg/dL 332-951   TRIG >=884 (H) 05/11/2010   CHOLHDL NOT REPORTED DUE TO HIGH TRIGLYCERIDES 05/11/2010  Currently not on meds  Will return tomorrow for fasting lipid panel . Denies abdominal pain,       Relevant Medications   lisinopril (ZESTRIL) 2.5 MG tablet   Other Visit Diagnoses     Screening for colon cancer       Relevant Orders   Cologuard       Outpatient Encounter Medications as of 02/07/2022  Medication Sig   ibuprofen (ADVIL) 600 MG tablet Take 1 tablet (600 mg total) by mouth every 8 (eight) hours as needed.   lisinopril (ZESTRIL) 2.5 MG tablet Take 1 tablet (2.5 mg total) by mouth daily.   metFORMIN (GLUCOPHAGE) 500 MG tablet Take 1  tablet (500 mg total) by mouth 2 (two) times daily with a meal.   polyethylene glycol powder (GLYCOLAX/MIRALAX) 17 GM/SCOOP powder Take 17 g by mouth daily as needed.   [DISCONTINUED] gabapentin (NEURONTIN) 100 MG capsule Take 1 capsule (100 mg total) by mouth 3 (three) times daily.   aspirin EC 81 MG tablet Take 81 mg by mouth daily. (Patient not taking: Reported on 02/07/2022)   gabapentin (NEURONTIN) 100 MG capsule Take 1 capsule (100 mg total) by mouth 3 (three) times daily.    HYDROcodone-acetaminophen (NORCO/VICODIN) 5-325 MG tablet Take 1 tablet by mouth every 6 (six) hours as needed for moderate pain. (Patient not taking: Reported on 02/07/2022)   ondansetron (ZOFRAN) 4 MG tablet Take 1 tablet (4 mg total) by mouth every 6 (six) hours. (Patient not taking: Reported on 02/07/2022)   ranitidine (ZANTAC) 150 MG capsule Take 1 capsule (150 mg total) by mouth daily. (Patient not taking: Reported on 02/07/2022)   No facility-administered encounter medications on file as of 02/07/2022.    Follow-up: Return in about 4 weeks (around 03/07/2022) for Diabetes. Renee Rival, FNP

## 2022-02-07 NOTE — Assessment & Plan Note (Signed)
BP Readings from Last 3 Encounters:  02/07/22 130/66  01/13/22 138/88  01/06/22 138/82  Start lisinopril 2.5 mg daily due yo his T2DM Dash diet advised, exercise daily at least 150 minutes as tolerated.  Follow up in 4 weeks

## 2022-02-07 NOTE — Assessment & Plan Note (Addendum)
Lab Results  Component Value Date   CHOL (H) 05/11/2010    308        ATP III CLASSIFICATION:  <200     mg/dL   Desirable  599-774  mg/dL   Borderline High  >=142    mg/dL   High          HDL NOT REPORTED DUE TO HIGH TRIGLYCERIDES 05/11/2010   LDLCALC UNABLE TO CALCULATE IF TRIGLYCERIDE OVER 400 mg/dL 39/53/2023   TRIG 3,435 (H) 05/11/2010   CHOLHDL NOT REPORTED DUE TO HIGH TRIGLYCERIDES 05/11/2010  Currently not on meds  Will return tomorrow for fasting lipid panel . Denies abdominal pain,

## 2022-02-07 NOTE — Assessment & Plan Note (Signed)
Noted on recent imaging, will refer patient to neurosurgeon as recommended by ER doctor Continue gabapentin 100mg  TID RX ibuprofen 800mg  TID pain.  .   Advanced multilevel degenerative changes of the cervical spine. 2. 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 3. Moderate-severe canal stenosis at C4-5 through C6-7. 4. Severe bilateral foraminal stenosis at multiple levels, as above.

## 2022-02-07 NOTE — Assessment & Plan Note (Addendum)
Most likely due to arthritis. Degenerative changes noted on recent imaging of the  left knee RX ibuprofen 800mg  TID PRN Will refer to othopedics if needed.

## 2022-02-07 NOTE — Assessment & Plan Note (Signed)
-   ibuprofen (ADVIL) 600 MG tablet; Take 1 tablet (600 mg total) by mouth every 8 (eight) hours as needed.  Dispense: 30 tablet; Refill: 0 - gabapentin (NEURONTIN) 100 MG capsule; Take 1 capsule (100 mg total) by mouth 3 (three) times daily.  Dispense: 90 capsule; Refill: 2

## 2022-02-07 NOTE — Assessment & Plan Note (Signed)
Chronic condition RX miralax 17g daily PRN Increase fiber , drink at least 64 ounces of water daily to maintain hydration.  Cologuard ordered  since he is due for colon cancer screening.

## 2022-02-07 NOTE — Patient Instructions (Addendum)
  Fasting labs tomorrow   1. Constipation, unspecified constipation type  - polyethylene glycol powder (GLYCOLAX/MIRALAX) 17 GM/SCOOP powder; Take 17 g by mouth daily as needed.  Dispense: 3350 g; Refill: 2  2. Type 2 diabetes mellitus with hyperglycemia, without long-term current use of insulin ([facility])  - Lipid Panel; Future - Urine microalbumin-creatinine with uACR; Future - POCT glycosylated hemoglobin (Hb A1C)  Start taking lisinopril 2.5mg  daily to help protect your kidneys   3. Chronic pain of right knee  - ibuprofen (ADVIL) 600 MG tablet; Take 1 tablet (600 mg total) by mouth every 8 (eight) hours as needed.  Dispense: 30 tablet; Refill: 0 - gabapentin (NEURONTIN) 100 MG capsule; Take 1 capsule (100 mg total) by mouth 3 (three) times daily.  Dispense: 90 capsule; Refill: 2    Thanks for choosing [facility] we consider it a privelige to serve you.

## 2022-02-07 NOTE — Assessment & Plan Note (Addendum)
Lab Results  Component Value Date   HGBA1C 7.0 (A) 02/07/2022  He started taking metformin 500mg  BID since about a month ago.  Pt counseled on low carb diet.  Started on lisinopril 2.5mg  daily for kidney protection, check BMP in 4 weeks  Diabetic foot exam completed, will discuss referral for eye exam at next visit Patient will return to the office tomorrow for fasting labs, UACR

## 2022-02-08 ENCOUNTER — Other Ambulatory Visit: Payer: Self-pay

## 2022-02-08 DIAGNOSIS — E1165 Type 2 diabetes mellitus with hyperglycemia: Secondary | ICD-10-CM

## 2022-02-09 ENCOUNTER — Other Ambulatory Visit: Payer: Self-pay | Admitting: Nurse Practitioner

## 2022-02-09 DIAGNOSIS — E782 Mixed hyperlipidemia: Secondary | ICD-10-CM

## 2022-02-09 LAB — LIPID PANEL
Chol/HDL Ratio: 6.5 ratio — ABNORMAL HIGH (ref 0.0–5.0)
Cholesterol, Total: 220 mg/dL — ABNORMAL HIGH (ref 100–199)
HDL: 34 mg/dL — ABNORMAL LOW (ref >39)
LDL Chol Calc (NIH): 164 mg/dL — ABNORMAL HIGH (ref 0–99)
Triglycerides: 121 mg/dL (ref 0–149)
VLDL Cholesterol Cal: 22 mg/dL (ref 5–40)

## 2022-02-09 LAB — MICROALBUMIN / CREATININE URINE RATIO
Creatinine, Urine: 90.1 mg/dL
Microalb/Creat Ratio: 6 mg/g creat (ref 0–29)
Microalbumin, Urine: 5.5 ug/mL

## 2022-02-09 MED ORDER — ATORVASTATIN CALCIUM 10 MG PO TABS: 10.0000 mg | ORAL_TABLET | Freq: Every day | ORAL | 0 refills | Status: DC

## 2022-02-09 NOTE — Progress Notes (Signed)
LDL goal is less than 70, start taking atorvastatin 10mg  daily to reduce risk of stroke, MI   Eat a healthy diet, including lots of fruits and vegetables. Avoid foods with a lot of saturated and trans fats, such as red meat, butter, fried foods and cheese . Maintain a healthy weight.   He should follow up with neurosurgeon as planned for his cervical stenosis, I have put in the referral.

## 2022-03-07 ENCOUNTER — Encounter: Payer: Self-pay | Admitting: Nurse Practitioner

## 2022-03-07 ENCOUNTER — Ambulatory Visit (INDEPENDENT_AMBULATORY_CARE_PROVIDER_SITE_OTHER): Payer: Self-pay | Admitting: Nurse Practitioner

## 2022-03-07 VITALS — BP 106/62 | HR 82 | Temp 97.5°F | Wt 147.2 lb

## 2022-03-07 DIAGNOSIS — M19011 Primary osteoarthritis, right shoulder: Secondary | ICD-10-CM

## 2022-03-07 DIAGNOSIS — M4802 Spinal stenosis, cervical region: Secondary | ICD-10-CM

## 2022-03-07 DIAGNOSIS — E782 Mixed hyperlipidemia: Secondary | ICD-10-CM

## 2022-03-07 DIAGNOSIS — M25561 Pain in right knee: Secondary | ICD-10-CM

## 2022-03-07 DIAGNOSIS — G8929 Other chronic pain: Secondary | ICD-10-CM

## 2022-03-07 DIAGNOSIS — I10 Essential (primary) hypertension: Secondary | ICD-10-CM

## 2022-03-07 MED ORDER — GABAPENTIN 300 MG PO CAPS: 300.0000 mg | ORAL_CAPSULE | Freq: Three times a day (TID) | ORAL | 3 refills | Status: DC

## 2022-03-07 MED ORDER — IBUPROFEN 600 MG PO TABS: 600.0000 mg | ORAL_TABLET | Freq: Three times a day (TID) | ORAL | 0 refills | Status: DC | PRN

## 2022-03-07 NOTE — Assessment & Plan Note (Signed)
Ibuprofen 600 mg 3 times daily as needed refilled start gabapentin 300 mg 3 times daily Patient referred to orthopedics

## 2022-03-07 NOTE — Patient Instructions (Signed)
    Chronic pain of right knee  - Ambulatory referral to Orthopedic Surgery - gabapentin (NEURONTIN) 300 MG capsule; Take 1 capsule (300 mg total) by mouth 3 (three) times daily.  Dispense: 90 capsule; Refill: 3 - ibuprofen (ADVIL) 600 MG tablet; Take 1 tablet (600 mg total) by mouth every 8 (eight) hours as needed.  Dispense: 30 tablet; Refill: 0  Cervical stenosis of spine  - gabapentin (NEURONTIN) 300 MG capsule; Take 1 capsule (300 mg total) by mouth 3 (three) times daily.  Dispense: 90 capsule; Refill: 3    It is important that you exercise regularly at least 30 minutes 5 times a week as tolerated  Think about what you will eat, plan ahead. Choose " clean, green, fresh or frozen" over canned, processed or packaged foods which are more sugary, salty and fatty. 70 to 75% of food eaten should be vegetables and fruit. Three meals at set times with snacks allowed between meals, but they must be fruit or vegetables. Aim to eat over a 12 hour period , example 7 am to 7 pm, and STOP after  your last meal of the day. Drink water,generally about 64 ounces per day, no other drink is as healthy. Fruit juice is best enjoyed in a healthy way, by EATING the fruit.  Thanks for choosing [facility] we consider it a privelige to serve you.

## 2022-03-07 NOTE — Assessment & Plan Note (Signed)
Lab Results  Component Value Date   CHOL 220 (H) 02/08/2022   HDL 34 (L) 02/08/2022   LDLCALC 164 (H) 02/08/2022   TRIG 121 02/08/2022   CHOLHDL 6.5 (H) 02/08/2022  Currently on atorvastatin 10 mg daily Will recheck labs at next visit Avoid fatty fried foods

## 2022-03-07 NOTE — Assessment & Plan Note (Signed)
Referral to neurosurgery is pending Ibuprofen 600 mg 8 hours as needed refilled Start gabapentin 300 mg 3 times daily

## 2022-03-07 NOTE — Assessment & Plan Note (Signed)
Patient referred to orthopedics Ibuprofen 600 mg 8 hours as needed refilled Start gabapentin 300 mg 3 times daily

## 2022-03-07 NOTE — Assessment & Plan Note (Signed)
BP Readings from Last 3 Encounters:  03/07/22 106/62  02/07/22 130/66  01/13/22 138/88  Currently well-controlled on lisinopril 2.5 mg daily Continue current medication DASH diet advised engage in regular moderate exercises at least 40 to 50 minutes weekly Check BMP today Follow-up in 4 months

## 2022-03-07 NOTE — Progress Notes (Signed)
Established Patient Office Visit  Subjective:  Patient ID: [name], male    DOB: 31-Mar-1960  Age: 61 y.o. MRN: [id number]  CC:  Chief Complaint  Patient presents with   Follow-up    HPI [name] is a 61 y.o. male with past medical history of hypertension, type 2 diabetes, primary osteoarthritis of right shoulder, chronic pain of right knee, cervical stenosis of the spine, mixed hyperlipidemia presents for follow-up for hypertension.   Hypertension.  Currently on lisinopril 2.5 mg daily.  No complaints of chest pain, edema, dizziness  He reports that he has been taking all medications as prescribed taking all medications as ordered.   He continues to have pain in his left knee, tingling numbness in both arms, bilateral shoulder pain.  Currently on gabapentin 100 mg 3 times daily.  Referral to neurosurgery is pending.  Interpretation services provided via [facility] health iPad.     Past Medical History:  Diagnosis Date   Diabetes mellitus without complication (HCC)     No past surgical history on file.  Family History  Problem Relation Age of Onset   Diabetes Father    Colon cancer Neg Hx    Prostate cancer Neg Hx     Social History   Socioeconomic History   Marital status: Single    Spouse name: Not on file   Number of children: 9   Years of education: Not on file   Highest education level: Not on file  Occupational History   Not on file  Tobacco Use   Smoking status: Never   Smokeless tobacco: Current  Substance and Sexual Activity   Alcohol use: Yes    Alcohol/week: 2.0 standard drinks of alcohol    Types: 2 Cans of beer per week    Comment: occas   Drug use: Not Currently    Comment: ued to smoke weed   Sexual activity: Not on file  Other Topics Concern   Not on file  Social History Narrative   Lives alone with a room mate .    Social Determinants of Health   Financial Resource Strain: Not on file  Food Insecurity: Not on file  Transportation  Needs: Not on file  Physical Activity: Not on file  Stress: Not on file  Social Connections: Not on file  Intimate Partner Violence: Not on file    Outpatient Medications Prior to Visit  Medication Sig Dispense Refill   atorvastatin (LIPITOR) 10 MG tablet Take 1 tablet (10 mg total) by mouth daily. 60 tablet 0   lisinopril (ZESTRIL) 2.5 MG tablet Take 1 tablet (2.5 mg total) by mouth daily. 60 tablet 0   metFORMIN (GLUCOPHAGE) 500 MG tablet Take 1 tablet (500 mg total) by mouth 2 (two) times daily with a meal. 30 tablet 1   polyethylene glycol powder (GLYCOLAX/MIRALAX) 17 GM/SCOOP powder Take 17 g by mouth daily as needed. 3350 g 2   gabapentin (NEURONTIN) 100 MG capsule Take 1 capsule (100 mg total) by mouth 3 (three) times daily. 90 capsule 2   ibuprofen (ADVIL) 600 MG tablet Take 1 tablet (600 mg total) by mouth every 8 (eight) hours as needed. 30 tablet 0   aspirin EC 81 MG tablet Take 81 mg by mouth daily. (Patient not taking: Reported on 02/07/2022)     ondansetron (ZOFRAN) 4 MG tablet Take 1 tablet (4 mg total) by mouth every 6 (six) hours. (Patient not taking: Reported on 02/07/2022) 12 tablet 0   ranitidine (ZANTAC) 150  MG capsule Take 1 capsule (150 mg total) by mouth daily. (Patient not taking: Reported on 02/07/2022) 30 capsule 0   HYDROcodone-acetaminophen (NORCO/VICODIN) 5-325 MG tablet Take 1 tablet by mouth every 6 (six) hours as needed for moderate pain. (Patient not taking: Reported on 02/07/2022) 12 tablet 0   No facility-administered medications prior to visit.    No Known Allergies  ROS Review of Systems  Constitutional:  Negative for activity change, appetite change, chills, diaphoresis and fatigue.  Respiratory:  Negative for cough, chest tightness, shortness of breath and wheezing.   Cardiovascular: Negative.  Negative for chest pain, palpitations and leg swelling.  Gastrointestinal: Negative.  Negative for abdominal distention, abdominal pain and anal bleeding.   Genitourinary:  Negative for difficulty urinating and flank pain.  Musculoskeletal:  Positive for arthralgias and gait problem.  Neurological:  Negative for dizziness, facial asymmetry and headaches.  Psychiatric/Behavioral:  Negative for agitation, behavioral problems and confusion.       Objective:    Physical Exam Constitutional:      General: He is not in acute distress.    Appearance: He is not ill-appearing, toxic-appearing or diaphoretic.  Eyes:     General: No scleral icterus.       Right eye: No discharge.        Left eye: No discharge.     Extraocular Movements: Extraocular movements intact.     Conjunctiva/sclera: Conjunctivae normal.  Cardiovascular:     Rate and Rhythm: Normal rate and regular rhythm.     Pulses: Normal pulses.     Heart sounds: Normal heart sounds. No murmur heard.    No friction rub. No gallop.  Pulmonary:     Effort: Pulmonary effort is normal. No respiratory distress.     Breath sounds: Normal breath sounds. No stridor. No wheezing, rhonchi or rales.  Chest:     Chest wall: No tenderness.  Abdominal:     General: There is no distension.     Palpations: Abdomen is soft.     Tenderness: There is no abdominal tenderness. There is no guarding.  Musculoskeletal:        General: Tenderness and deformity present. No swelling.     Right lower leg: No edema.     Left lower leg: No edema.     Comments: Tenderness on range of motion of right knee,  skin warm and dry ambulating with a cane.  No swelling or redness noted, has palpable radial pulses.  Has tenderness and subjective numbness of anterior forearms bilaterally  Skin:    General: Skin is warm and dry.     Capillary Refill: Capillary refill takes less than 2 seconds.  Neurological:     Mental Status: He is alert and oriented to person, place, and time.  Psychiatric:        Mood and Affect: Mood normal.        Behavior: Behavior normal.        Thought Content: Thought content normal.         Judgment: Judgment normal.     BP 106/62   Pulse 82   Temp (!) 97.5 F (36.4 C)   Wt 147 lb 3.2 oz (66.8 kg)   SpO2 99%   BMI 25.27 kg/m  Wt Readings from Last 3 Encounters:  03/07/22 147 lb 3.2 oz (66.8 kg)  02/07/22 147 lb 12.8 oz (67 kg)  01/13/22 160 lb (72.6 kg)    Lab Results  Component Value Date   TSH  0.931 05/10/2010   Lab Results  Component Value Date   WBC 7.0 01/13/2022   HGB 14.1 01/13/2022   HCT 42.1 01/13/2022   MCV 94.4 01/13/2022   PLT 220 01/13/2022   Lab Results  Component Value Date   NA 135 01/13/2022   K 4.0 01/13/2022   CO2 23 01/13/2022   GLUCOSE 265 (H) 01/13/2022   BUN 15 01/13/2022   CREATININE 0.70 01/13/2022   BILITOT 1.2 01/13/2022   ALKPHOS 81 01/13/2022   AST 21 01/13/2022   ALT 17 01/13/2022   PROT 7.3 01/13/2022   ALBUMIN 3.9 01/13/2022   CALCIUM 9.0 01/13/2022   ANIONGAP 9 01/13/2022   Lab Results  Component Value Date   CHOL 220 (H) 02/08/2022   Lab Results  Component Value Date   HDL 34 (L) 02/08/2022   Lab Results  Component Value Date   LDLCALC 164 (H) 02/08/2022   Lab Results  Component Value Date   TRIG 121 02/08/2022   Lab Results  Component Value Date   CHOLHDL 6.5 (H) 02/08/2022   Lab Results  Component Value Date   HGBA1C 7.0 (A) 02/07/2022      Assessment & Plan:   Problem List Items Addressed This Visit       Cardiovascular and Mediastinum   Primary hypertension    BP Readings from Last 3 Encounters:  03/07/22 106/62  02/07/22 130/66  01/13/22 138/88  Currently well-controlled on lisinopril 2.5 mg daily Continue current medication DASH diet advised engage in regular moderate exercises at least 40 to 50 minutes weekly Check BMP today Follow-up in 4 months      Relevant Orders   Basic Metabolic Panel     Other   Chronic pain of right knee    Patient referred to orthopedics Ibuprofen 600 mg 8 hours as needed refilled Start gabapentin 300 mg 3 times daily      Relevant  Medications   gabapentin (NEURONTIN) 300 MG capsule   ibuprofen (ADVIL) 600 MG tablet   Other Relevant Orders   Ambulatory referral to Orthopedic Surgery   Cervical stenosis of spine - Primary    Referral to neurosurgery is pending Ibuprofen 600 mg 8 hours as needed refilled Start gabapentin 300 mg 3 times daily      Relevant Medications   gabapentin (NEURONTIN) 300 MG capsule   Mixed hyperlipidemia    Lab Results  Component Value Date   CHOL 220 (H) 02/08/2022   HDL 34 (L) 02/08/2022   LDLCALC 164 (H) 02/08/2022   TRIG 121 02/08/2022   CHOLHDL 6.5 (H) 02/08/2022  Currently on atorvastatin 10 mg daily Will recheck labs at next visit Avoid fatty fried foods       Meds ordered this encounter  Medications   gabapentin (NEURONTIN) 300 MG capsule    Sig: Take 1 capsule (300 mg total) by mouth 3 (three) times daily.    Dispense:  90 capsule    Refill:  3   ibuprofen (ADVIL) 600 MG tablet    Sig: Take 1 tablet (600 mg total) by mouth every 8 (eight) hours as needed.    Dispense:  30 tablet    Refill:  0    Follow-up: Return in about 4 months (around 07/07/2022) for DM/HLD/HTN. Donell Beers, FNP

## 2022-03-08 ENCOUNTER — Encounter: Payer: Self-pay | Admitting: Sports Medicine

## 2022-03-08 ENCOUNTER — Ambulatory Visit (INDEPENDENT_AMBULATORY_CARE_PROVIDER_SITE_OTHER): Payer: Self-pay

## 2022-03-08 ENCOUNTER — Ambulatory Visit: Payer: Self-pay

## 2022-03-08 ENCOUNTER — Ambulatory Visit (INDEPENDENT_AMBULATORY_CARE_PROVIDER_SITE_OTHER): Payer: Self-pay | Admitting: Sports Medicine

## 2022-03-08 VITALS — Ht 64.0 in | Wt 147.0 lb

## 2022-03-08 DIAGNOSIS — M25561 Pain in right knee: Secondary | ICD-10-CM

## 2022-03-08 DIAGNOSIS — G8929 Other chronic pain: Secondary | ICD-10-CM

## 2022-03-08 DIAGNOSIS — M25562 Pain in left knee: Secondary | ICD-10-CM

## 2022-03-08 DIAGNOSIS — M1712 Unilateral primary osteoarthritis, left knee: Secondary | ICD-10-CM

## 2022-03-08 DIAGNOSIS — Z720 Tobacco use: Secondary | ICD-10-CM

## 2022-03-08 DIAGNOSIS — M25462 Effusion, left knee: Secondary | ICD-10-CM

## 2022-03-08 DIAGNOSIS — I739 Peripheral vascular disease, unspecified: Secondary | ICD-10-CM

## 2022-03-08 LAB — BASIC METABOLIC PANEL
BUN/Creatinine Ratio: 19 (ref 10–24)
BUN: 15 mg/dL (ref 8–27)
CO2: 23 mmol/L (ref 20–29)
Calcium: 10 mg/dL (ref 8.6–10.2)
Chloride: 98 mmol/L (ref 96–106)
Creatinine, Ser: 0.77 mg/dL (ref 0.76–1.27)
Glucose: 171 mg/dL — ABNORMAL HIGH (ref 70–99)
Potassium: 3.9 mmol/L (ref 3.5–5.2)
Sodium: 135 mmol/L (ref 134–144)
eGFR: 101 mL/min/{1.73_m2} (ref >59)

## 2022-03-08 MED ORDER — BUPIVACAINE HCL 0.25 % IJ SOLN
2.0000 mL | INTRAMUSCULAR | Status: AC | PRN
  Administered 2022-03-08: 2 mL via INTRA_ARTICULAR

## 2022-03-08 MED ORDER — METHYLPREDNISOLONE ACETATE 40 MG/ML IJ SUSP
80.0000 mg | INTRAMUSCULAR | Status: AC | PRN
  Administered 2022-03-08: 80 mg via INTRA_ARTICULAR

## 2022-03-08 MED ORDER — LIDOCAINE HCL 1 % IJ SOLN
2.0000 mL | INTRAMUSCULAR | Status: AC | PRN
  Administered 2022-03-08: 2 mL

## 2022-03-08 NOTE — Progress Notes (Addendum)
[name] - 61 y.o. male MRN [id number]  Date of birth: Aug 19, 1960  Office Visit Note: Visit Date: 03/08/2022 PCP: No primary care provider on file. Referred by: Donell Beers, FNP  Subjective: Chief Complaint  Patient presents with   Right Knee - Pain   Left Knee - Pain   HPI: [name] is a pleasant 61 y.o. male who presents today for bilateral knee pain, L > R. Also reports that both of his legs feel "cold" at times.   Bilateral knee pain -his left knee has always been very severe.  Here recently over the past couple weeks she has had increasing pain and feels like the knee is giving out.  Also notes some swelling.  Denies any specific injury.  His right knee will bother him at times with walking, may be because of compensation.  He is not taking any medications for his pain.  He has had prescription medications recommended in the past, but states he does not have a driver's license or ID so is unable to pick up any medication.  He continues to work in [employer] in the past, currently not working.  He is the only member of his family that is here in [place].  He currently does not have medical insurance to his knowledge.  He does get pain that goes down both legs at times.  He noticed this was worse when he was using a jackhammer at work.  Occasionally reports some tingling sensation.  This pain is worse with walking. At times, his Legs feel "cold".  He is worried about his circulation.  He does smoke 2-3 cigarettes a day intermittently.  Medications used: None - does not have ID to get prescription medications  Has history of type II DM, HTN - last A1c was 7.0 on 02/07/22. BMI: Body mass index is 25.23 kg/m. Tobacco use or ETOH use: Smokes 2-3 cigs/day, intermittent use. No ETOH use. No history of CAD.   Pertinent ROS were reviewed with the patient and found to be negative unless otherwise specified above in HPI.   Assessment & Plan: Visit Diagnoses:  1. Unilateral  primary osteoarthritis, left knee   2. Chronic pain of right knee   3. Chronic pain of left knee   4. Effusion, left knee   5. Tobacco use   6. PAD (peripheral artery disease) (HCC)    Plan: Discussed with [name] all of his conditions and treatment options.  His left knee has end-stage bone-on-bone arthritis with complete collapse of the medial joint line.  He also has a knee effusion today.  Proceed with ultrasound-guided knee aspiration and injection, he did get good pain relief immediately following this. May use over-the-counter tylenol or ibuprofen for any post-injection pain. His knee and his other chronic right knee pain, we did discuss the role for prescription NSAIDs, however the patient reports he does not have a driver's license or ID and so is unable to pick-up medications. I did discuss with him he would be a candidate for total knee arthroplasty given the degree of his osteoarthritis, however given he has no insurance this is likely not a viable option for him. His diagnosis and treatment for this and his likely PAD is certainly limited by social determinants of health. I did send a message to our office coordinator so they could contact him to try to help get him setup with some sort of insurance and/or coverage. In terms of his right knee pain, he has mild OA  but I think more of his pain is for compensation. He has symptoms suggestive of PAD as well, will order bilateral ABI's. Recommended tobacco cessation. We will f/u as needed.   Follow-up: Return if symptoms worsen or fail to improve. Will work to try to get him insurance coverage before f/u or seeing Surgeon for discussion on knee replacement.  Meds & Orders:  Meds ordered this encounter  Medications   bupivacaine (MARCAINE) 0.25 % (with pres) injection 2 mL   lidocaine (XYLOCAINE) 1 % (with pres) injection 2 mL   methylPREDNISolone acetate (DEPO-MEDROL) injection 80 mg    Orders Placed This Encounter  Procedures   Large Joint  Inj: L knee   XR Knee Complete 4 Views Left   US Guided Needle Placement - No Linked Charges   US ARTERIAL ABI (SCREENING LOWER EXTREMITY)   XR Knee 1-2 Views Right     Procedures: Large Joint Inj: L knee on 03/08/2022 11:08 AM Details: 22 G 1.5 in needle, ultrasound-guided anterolateral approach Medications: 2 mL lidocaine 1 %; 2 mL bupivacaine 0.25 %; 80 mg methylPREDNISolone acetate 40 MG/ML Aspirate: clear and yellow Outcome: tolerated well, no immediate complications  US-guided Knee Aspiration, left: After discussion on risks/benefits/indications was provided, informed verbal consent was obtained and a timeout was performed, patient was lying supine on exam table. The knee was prepped with alcohol swab.  Utilizing ultrasound guidance and a superolateral approach, approximately 5 mL of lidocaine 1% was used for local anesthesia. Then, using ultrasound in an in-plane technique, an 18g needle on 60cc syringe was inserted and approximately 35 mL of clear straw-colored fluid was aspirated from the knee. Utilizing the same portal, the knee joint was then injected with 2:2:2 lidocaine:bupivicaine:depomedrol with visualization on injectate flow on ultrasound into the knee joint.  Patient tolerated procedure well without immediate complications.  Procedure, treatment alternatives, risks and benefits explained, specific risks discussed. Consent was given by the patient. Immediately prior to procedure a time out was called to verify the correct patient, procedure, equipment, support staff and site/side marked as required. Patient was prepped and draped in the usual sterile fashion.          Clinical History: No specialty comments available.  He reports that he has never smoked. He uses smokeless tobacco.  Recent Labs    02/07/22 1533  HGBA1C 7.0*    Objective:   Vital Signs: Ht 5\' 4"  (1.626 m)   Wt 147 lb (66.7 kg)   BMI 25.23 kg/m   Physical Exam  Gen: Well-appearing, in no acute  distress; non-toxic CV: Regular Rate. Well-perfused. Warm.  Resp: Breathing unlabored on room air; no wheezing. Psych: Fluid speech in conversation; appropriate affect; normal thought process Neuro: Sensation intact throughout. No gross coordination deficits.   Ortho Exam - Bilateral knees: There is a notable effusion of the left knee, no excessive warmth or erythema.  Range of motion is limited from 5-115 degrees.  Right knee from 0-135 degrees.  There is some reproducible clicking with modified McMurray's testing on the right, although no significant pain.  There is some mild pain on the medial joint line.  No swelling here.  Patellar crepitus with knee flexion extension of the left knee.  Mild valgus stress upon standing.   Patient does walk with a antalgic gait, he does use a cane on the right hand to offset his left knee.  Imaging: XR Knee Complete 4 Views Left  Result Date: 03/08/2022 4 views of the left knee including  bilateral AP standing and Rosenberg; left knee lateral and sunrise views were ordered and reviewed by myself.  X-rays show severe bone-on-bone medial joint space collapse with significant sclerosis and breakdown of the medial femoral condyle medial tibial plateau.  There is bony cystic formation noted.  There is a moderate degree of tibial medial subluxation.  There is severe patellofemoral joint arthritis as well.  No acute fracture noted.  The right knee has rather preserved joint space in both the medial and lateral compartment, mild OA.  US Guided Needle Placement - No Linked Charges  Result Date: 03/08/2022 Technically successful ultrasound-guided left knee aspiration and injection.   *Independent review of left 4-view knee xray from 01/06/22 was reviewed and interpreted by myself.  There is advanced tricompartmental arthritic change, there is severe bone-on-bone change in the medial compartment with notable sclerosis and bony breakdown of the medial femoral condyle  and medial tibial plateau.  Associated cystic formation in these regions.  Likely small joint effusion noted.  Severe patellofemoral arthritis.  These unfortunately do not appear to be weightbearing films.  Narrative & Impression  CLINICAL DATA:  Bilateral shoulder and diffuse left knee pain for 2 weeks. No known injury.   EXAM: LEFT KNEE - COMPLETE 4+ VIEW   COMPARISON:  Left knee radiographs 01/23/2011.   FINDINGS: Significantly progressive tricompartmental degenerative changes are demonstrated, most advanced in the medial and patellofemoral compartments. There are associated osteophytes and a moderate to large joint effusion. No evidence of acute fracture or dislocation. Femoropopliteal atherosclerosis noted.   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     Electronically Signed   By: Carey Bullocks M.D.   On: 01/06/2022 14:36    Past Medical/Family/Surgical/Social History: Medications & Allergies reviewed per EMR, new medications updated. Patient Active Problem List   Diagnosis Date Noted   Constipation 02/07/2022   Type 2 diabetes mellitus with hyperglycemia (HCC) 02/07/2022   Chronic pain of right knee 02/07/2022   Cervical stenosis of spine 02/07/2022   Primary osteoarthritis of right shoulder 02/07/2022   Primary hypertension 02/07/2022   Mixed hyperlipidemia 02/07/2022   Past Medical History:  Diagnosis Date   Diabetes mellitus without complication (HCC)    Family History  Problem Relation Age of Onset   Diabetes Father    Colon cancer Neg Hx    Prostate cancer Neg Hx    History reviewed. No pertinent surgical history. Social History   Occupational History   Not on file  Tobacco Use   Smoking status: Never   Smokeless tobacco: Current  Substance and Sexual Activity   Alcohol use: Yes    Alcohol/week: 2.0 standard drinks of alcohol    Types: 2 Cans of beer per week     Comment: occas   Drug use: Not Currently    Comment: ued to smoke weed   Sexual activity: Not on file

## 2022-03-08 NOTE — Progress Notes (Signed)
Normal labs.

## 2022-03-08 NOTE — Progress Notes (Signed)
Bilateral knee pain Left worse than right  Denies any medication for pain States poor circulation in the right knee/leg

## 2022-03-09 NOTE — Progress Notes (Signed)
Attempted to call [name].  There was no answer and there is no voicemail set up.  Will try again.  Plan to give information about [name].

## 2022-04-08 ENCOUNTER — Other Ambulatory Visit: Payer: Self-pay

## 2022-04-08 MED ORDER — LISINOPRIL 2.5 MG PO TABS: 2.5000 mg | ORAL_TABLET | Freq: Every day | ORAL | 1 refills | Status: DC

## 2022-04-26 ENCOUNTER — Ambulatory Visit: Payer: Self-pay | Admitting: Family Medicine

## 2022-04-26 ENCOUNTER — Telehealth: Payer: Self-pay

## 2022-05-23 NOTE — Telephone Encounter (Signed)
Done. [name]

## 2022-05-26 ENCOUNTER — Other Ambulatory Visit: Payer: Self-pay

## 2022-05-26 DIAGNOSIS — E782 Mixed hyperlipidemia: Secondary | ICD-10-CM

## 2022-05-26 MED ORDER — ATORVASTATIN CALCIUM 10 MG PO TABS: 10.0000 mg | ORAL_TABLET | Freq: Every day | ORAL | 0 refills | Status: DC

## 2022-05-30 ENCOUNTER — Ambulatory Visit (INDEPENDENT_AMBULATORY_CARE_PROVIDER_SITE_OTHER): Payer: Self-pay | Admitting: Nurse Practitioner

## 2022-05-30 ENCOUNTER — Encounter: Payer: Self-pay | Admitting: Nurse Practitioner

## 2022-05-30 VITALS — BP 109/66 | HR 86 | Temp 97.9°F | Wt 150.0 lb

## 2022-05-30 DIAGNOSIS — M25561 Pain in right knee: Secondary | ICD-10-CM

## 2022-05-30 DIAGNOSIS — I10 Essential (primary) hypertension: Secondary | ICD-10-CM

## 2022-05-30 DIAGNOSIS — M79604 Pain in right leg: Secondary | ICD-10-CM | POA: Insufficient documentation

## 2022-05-30 DIAGNOSIS — Z716 Tobacco abuse counseling: Secondary | ICD-10-CM | POA: Insufficient documentation

## 2022-05-30 DIAGNOSIS — E1165 Type 2 diabetes mellitus with hyperglycemia: Secondary | ICD-10-CM

## 2022-05-30 DIAGNOSIS — Z1321 Encounter for screening for nutritional disorder: Secondary | ICD-10-CM

## 2022-05-30 DIAGNOSIS — G8929 Other chronic pain: Secondary | ICD-10-CM

## 2022-05-30 DIAGNOSIS — E782 Mixed hyperlipidemia: Secondary | ICD-10-CM

## 2022-05-30 LAB — POCT GLYCOSYLATED HEMOGLOBIN (HGB A1C): Hemoglobin A1C: 7.1 % — AB (ref 4.0–5.6)

## 2022-05-30 MED ORDER — IBUPROFEN 600 MG PO TABS: 600.0000 mg | ORAL_TABLET | Freq: Three times a day (TID) | ORAL | 0 refills | Status: DC | PRN

## 2022-05-30 MED ORDER — METFORMIN HCL 1000 MG PO TABS: 1000.0000 mg | ORAL_TABLET | Freq: Two times a day (BID) | ORAL | 0 refills | Status: DC

## 2022-05-30 NOTE — Assessment & Plan Note (Signed)
He currently denies being Ibuprofen 600 mg every 8 hours as needed refill He was encouraged to alternate with Tylenol 650 mg every 6 hours as needed Application of heat and ice encouraged Continue gabapentin 300 mg 3 times daily and follow-up with orthopedics as needed

## 2022-05-30 NOTE — Assessment & Plan Note (Signed)
BP Readings from Last 3 Encounters:  05/30/22 109/66  03/07/22 106/62  02/07/22 130/66  Continue lisinopril 2.5 mg daily DASH diet advised Exercise as tolerated

## 2022-05-30 NOTE — Addendum Note (Signed)
Addended byVonzell Schlatter on: 05/30/2022 12:38 PM   Modules accepted: Orders

## 2022-05-30 NOTE — Assessment & Plan Note (Signed)
Has diabetes, current smoker Patient referred to vascular to screen for PAD

## 2022-05-30 NOTE — Assessment & Plan Note (Addendum)
   Smokes about 3 cigarettes/day  Asked about quitting: confirms that he/she currently smokes cigarettes Advise to quit smoking: Educated about QUITTING to reduce the risk of cancer, cardio and cerebrovascular disease. Assess willingness: Unwilling to quit at this time, but is working on cutting back. Assist with counseling and pharmacotherapy: Counseled for 5 minutes and literature provided. Arrange for follow up: follow up in 3 months and continue to offer help.

## 2022-05-30 NOTE — Assessment & Plan Note (Signed)
Lab Results  Component Value Date   HGBA1C 7.0 (A) 02/07/2022  Current A1c 7.1 Currently on metformin 500 mg twice daily Start metformin 1000 mg twice daily Patient counseled on low-carb modified diet Signs of hypoglycemia discussed Patient referred for diabetic eye exam Follow-up in 3 months

## 2022-05-30 NOTE — Progress Notes (Signed)
Established Patient Office Visit  Subjective:  Patient ID: [name], male    DOB: 21-Mar-1961  Age: 62 y.o. MRN: [id number]  CC:  Chief Complaint  Patient presents with   Groin Pain    Groin pain specially at nigh started 3 month ago, and feet bumps. Abdominal discomfort.   Shoulder Pain    HPI [name] is a 62 y.o. male  has a past medical history of Arthritis of left knee, Cervical stenosis of spine, Diabetes mellitus without complication ([place]), Hyperlipidemia LDL goal <70, and Tobacco use.  Patient presents today for follow-up for his chronic medical conditions   Type 2 diabetes.  Currently on metformin 500 mg twice daily he has a glucometer at home but he has not been checking his blood sugar.  No complaints of polyuria, polydipsia, polyphagia.  Patient reports intermittent weakness.  Patient complains of chronic right leg pain, tingling sensation in his right leg worse at nighttime when laying down right leg feels cold sometimes he continues to smoke 2 to 3 cigarettes daily for the past 5 years.  He takes gabapentin 300 mg 3 times daily. ABI was ordered by orthopedics to rule out PAD but patient did not get done test done.  We discussed referral to vascular for PAD screening, referral to next today.  He currently denies right knee pain, total knee arthroplasty was discussed with the patient by orthopedics but due to lack of insurance this is not a viable option for him.   Due for shingles vaccine Tdap vaccine flu vaccine patient encouraged to get the vaccines at his pharmacy.  Interpretation provided via [facility]   Past Medical History:  Diagnosis Date   Arthritis of left knee    Cervical stenosis of spine    Diabetes mellitus without complication ([place])    Hyperlipidemia LDL goal <70    Tobacco use     History reviewed. No pertinent surgical history.  Family History  Problem Relation Age of Onset   Diabetes Father    Colon cancer Neg Hx    Prostate cancer Neg  Hx     Social History   Socioeconomic History   Marital status: Single    Spouse name: Not on file   Number of children: 9   Years of education: Not on file   Highest education level: Not on file  Occupational History   Not on file  Tobacco Use   Smoking status: Every Day    Types: Cigarettes   Smokeless tobacco: Current  Substance and Sexual Activity   Alcohol use: Yes    Alcohol/week: 2.0 standard drinks of alcohol    Types: 2 Cans of beer per week    Comment: occas   Drug use: Not Currently    Comment: ued to smoke weed   Sexual activity: Not on file  Other Topics Concern   Not on file  Social History Narrative   Lives alone with a room mate .    Social Determinants of Health   Financial Resource Strain: Not on file  Food Insecurity: Not on file  Transportation Needs: Not on file  Physical Activity: Not on file  Stress: Not on file  Social Connections: Not on file  Intimate Partner Violence: Not on file    Outpatient Medications Prior to Visit  Medication Sig Dispense Refill   aspirin EC 81 MG tablet Take 81 mg by mouth daily.     atorvastatin (LIPITOR) 10 MG tablet Take 1 tablet (10  mg total) by mouth daily. 90 tablet 0   gabapentin (NEURONTIN) 300 MG capsule Take 1 capsule (300 mg total) by mouth 3 (three) times daily. 90 capsule 3   lisinopril (ZESTRIL) 2.5 MG tablet Take 1 tablet (2.5 mg total) by mouth daily. 90 tablet 1   metFORMIN (GLUCOPHAGE) 500 MG tablet Take 1 tablet (500 mg total) by mouth 2 (two) times daily with a meal. 30 tablet 1   ondansetron (ZOFRAN) 4 MG tablet Take 1 tablet (4 mg total) by mouth every 6 (six) hours. (Patient not taking: Reported on 02/07/2022) 12 tablet 0   polyethylene glycol powder (GLYCOLAX/MIRALAX) 17 GM/SCOOP powder Take 17 g by mouth daily as needed. (Patient not taking: Reported on 05/30/2022) 3350 g 2   ranitidine (ZANTAC) 150 MG capsule Take 1 capsule (150 mg total) by mouth daily. (Patient not taking: Reported on  02/07/2022) 30 capsule 0   ibuprofen (ADVIL) 600 MG tablet Take 1 tablet (600 mg total) by mouth every 8 (eight) hours as needed. (Patient not taking: Reported on 05/30/2022) 30 tablet 0   No facility-administered medications prior to visit.    No Known Allergies  ROS Review of Systems  Constitutional: Negative.   Respiratory: Negative.    Cardiovascular: Negative.   Gastrointestinal: Negative.   Endocrine: Negative.   Musculoskeletal:  Positive for arthralgias and gait problem. Negative for joint swelling, neck pain and neck stiffness.  Skin: Negative.   Psychiatric/Behavioral: Negative.        Objective:    Physical Exam Constitutional:      General: He is not in acute distress.    Appearance: He is not ill-appearing, toxic-appearing or diaphoretic.  Eyes:     General: No scleral icterus.       Right eye: No discharge.        Left eye: No discharge.     Extraocular Movements: Extraocular movements intact.  Cardiovascular:     Rate and Rhythm: Normal rate and regular rhythm.     Pulses: Normal pulses.     Heart sounds: Normal heart sounds. No murmur heard.    No friction rub. No gallop.  Pulmonary:     Effort: Pulmonary effort is normal. No respiratory distress.     Breath sounds: Normal breath sounds. No stridor. No wheezing, rhonchi or rales.  Chest:     Chest wall: No tenderness.  Abdominal:     Palpations: Abdomen is soft.     Tenderness: There is no guarding.  Musculoskeletal:        General: No swelling, tenderness or signs of injury.     Right lower leg: No edema.     Left lower leg: No edema.  Skin:    General: Skin is warm and dry.     Coloration: Skin is not jaundiced or pale.     Findings: No bruising or erythema.  Neurological:     Mental Status: He is alert and oriented to person, place, and time.     Motor: No weakness.     Gait: Gait abnormal.  Psychiatric:        Mood and Affect: Mood normal.        Behavior: Behavior normal.        Thought  Content: Thought content normal.        Judgment: Judgment normal.     BP 109/66   Pulse 86   Temp 97.9 F (36.6 C)   Wt 150 lb (68 kg)   SpO2 98%  BMI 25.75 kg/m  Wt Readings from Last 3 Encounters:  05/30/22 150 lb (68 kg)  03/08/22 147 lb (66.7 kg)  03/07/22 147 lb 3.2 oz (66.8 kg)    Lab Results  Component Value Date   TSH 0.931 05/10/2010   Lab Results  Component Value Date   WBC 7.0 01/13/2022   HGB 14.1 01/13/2022   HCT 42.1 01/13/2022   MCV 94.4 01/13/2022   PLT 220 01/13/2022   Lab Results  Component Value Date   NA 135 03/07/2022   K 3.9 03/07/2022   CO2 23 03/07/2022   GLUCOSE 171 (H) 03/07/2022   BUN 15 03/07/2022   CREATININE 0.77 03/07/2022   BILITOT 1.2 01/13/2022   ALKPHOS 81 01/13/2022   AST 21 01/13/2022   ALT 17 01/13/2022   PROT 7.3 01/13/2022   ALBUMIN 3.9 01/13/2022   CALCIUM 10.0 03/07/2022   ANIONGAP 9 01/13/2022   EGFR 101 03/07/2022   Lab Results  Component Value Date   CHOL 220 (H) 02/08/2022   Lab Results  Component Value Date   HDL 34 (L) 02/08/2022   Lab Results  Component Value Date   LDLCALC 164 (H) 02/08/2022   Lab Results  Component Value Date   TRIG 121 02/08/2022   Lab Results  Component Value Date   CHOLHDL 6.5 (H) 02/08/2022   Lab Results  Component Value Date   HGBA1C 7.0 (A) 02/07/2022      Assessment & Plan:   Problem List Items Addressed This Visit       Cardiovascular and Mediastinum   Primary hypertension    BP Readings from Last 3 Encounters:  05/30/22 109/66  03/07/22 106/62  02/07/22 130/66  Continue lisinopril 2.5 mg daily DASH diet advised Exercise as tolerated        Endocrine   Type 2 diabetes mellitus with hyperglycemia (HCC)    Lab Results  Component Value Date   HGBA1C 7.0 (A) 02/07/2022  Current A1c 7.1 Currently on metformin 500 mg twice daily Start metformin 1000 mg twice daily Patient counseled on low-carb modified diet Signs of hypoglycemia  discussed Patient referred for diabetic eye exam Follow-up in 3 months       Relevant Medications   metFORMIN (GLUCOPHAGE) 1000 MG tablet   Other Relevant Orders   Ambulatory referral to Ophthalmology     Other   Chronic pain of right knee    He currently denies being Ibuprofen 600 mg every 8 hours as needed refill He was encouraged to alternate with Tylenol 650 mg every 6 hours as needed Application of heat and ice encouraged Continue gabapentin 300 mg 3 times daily and follow-up with orthopedics as needed      Relevant Medications   ibuprofen (ADVIL) 600 MG tablet   Mixed hyperlipidemia    Currently on atorvastatin 10 mg daily LDL goal is less than 70 Check fasting lipid panel Avoid fatty foods Lab Results  Component Value Date   CHOL 220 (H) 02/08/2022   HDL 34 (L) 02/08/2022   LDLCALC 164 (H) 02/08/2022   TRIG 121 02/08/2022   CHOLHDL 6.5 (H) 02/08/2022         Tobacco abuse counseling      Smokes about 3 cigarettes/day  Asked about quitting: confirms that he/she currently smokes cigarettes Advise to quit smoking: Educated about QUITTING to reduce the risk of cancer, cardio and cerebrovascular disease. Assess willingness: Unwilling to quit at this time, but is working on cutting back. Assist with counseling and  pharmacotherapy: Counseled for 5 minutes and literature provided. Arrange for follow up: follow up in 3 months and continue to offer help.       Right leg pain    Has diabetes, current smoker Patient referred to vascular to screen for PAD      Relevant Orders   Ambulatory referral to Vascular Surgery   Vitamin B12   Other Visit Diagnoses     Encounter for vitamin deficiency screening    -  Primary   Relevant Orders   Lipid Panel   Vitamin D, 25-hydroxy       Meds ordered this encounter  Medications   metFORMIN (GLUCOPHAGE) 1000 MG tablet    Sig: Take 1 tablet (1,000 mg total) by mouth 2 (two) times daily with a meal.    Dispense:  180  tablet    Refill:  0   ibuprofen (ADVIL) 600 MG tablet    Sig: Take 1 tablet (600 mg total) by mouth every 8 (eight) hours as needed.    Dispense:  30 tablet    Refill:  0    Follow-up: Return in about 4 months (around 09/29/2022) for HLD/T2DM.    Renee Rival, FNP

## 2022-05-30 NOTE — Patient Instructions (Addendum)
.  Goal for fasting blood sugar ranges from 80 to 120 and 2 hours after any meal or at bedtime should be between 130 to 170. Get TDAP vaccine, shingles vaccine at the pharmacy.   .Type 2 diabetes mellitus with hyperglycemia, without long-term current use of insulin (HCC)  - metFORMIN (GLUCOPHAGE) 1000 MG tablet; Take 1 tablet (1,000 mg total) by mouth 2 (two) times daily with a meal.  Dispense: 180 tablet; Refill: 0 - Ambulatory referral to Ophthalmology   Right leg pain  - Ambulatory referral to Vascular Surgery - Vitamin B12   Chronic pain of right knee  - ibuprofen (ADVIL) 600 MG tablet; Take 1 tablet (600 mg total) by mouth every 8 (eight) hours as needed.  Dispense: 30 tablet; Please alternate with tylenol 650mg  every 6 hours as needed.     It is important that you exercise regularly at least 30 minutes 5 times a week as tolerated  Think about what you will eat, plan ahead. Choose " clean, green, fresh or frozen" over canned, processed or packaged foods which are more sugary, salty and fatty. 70 to 75% of food eaten should be vegetables and fruit. Three meals at set times with snacks allowed between meals, but they must be fruit or vegetables. Aim to eat over a 12 hour period , example 7 am to 7 pm, and STOP after  your last meal of the day. Drink water,generally about 64 ounces per day, no other drink is as healthy. Fruit juice is best enjoyed in a healthy way, by EATING the fruit.  Thanks for choosing [name] we consider it a privelige to serve you.      It is important that you exercise regularly at least 30 minutes 5 times a week as tolerated  Think about what you will eat, plan ahead. Choose " clean, green, fresh or frozen" over canned, processed or packaged foods which are more sugary, salty and fatty. 70 to 75% of food eaten should be vegetables and fruit. Three meals at set times with snacks allowed between meals, but they must be fruit or vegetables. Aim  to eat over a 12 hour period , example 7 am to 7 pm, and STOP after  your last meal of the day. Drink water,generally about 64 ounces per day, no other drink is as healthy. Fruit juice is best enjoyed in a healthy way, by EATING the fruit.  Thanks for choosing [name] we consider it a privelige to serve you.

## 2022-05-30 NOTE — Assessment & Plan Note (Signed)
Currently on atorvastatin 10 mg daily LDL goal is less than 70 Check fasting lipid panel Avoid fatty foods Lab Results  Component Value Date   CHOL 220 (H) 02/08/2022   HDL 34 (L) 02/08/2022   LDLCALC 164 (H) 02/08/2022   TRIG 121 02/08/2022   CHOLHDL 6.5 (H) 02/08/2022

## 2022-05-31 ENCOUNTER — Other Ambulatory Visit: Payer: Self-pay | Admitting: Nurse Practitioner

## 2022-05-31 DIAGNOSIS — E538 Deficiency of other specified B group vitamins: Secondary | ICD-10-CM

## 2022-05-31 DIAGNOSIS — E782 Mixed hyperlipidemia: Secondary | ICD-10-CM

## 2022-05-31 LAB — LIPID PANEL
Chol/HDL Ratio: 5.3 ratio — ABNORMAL HIGH (ref 0.0–5.0)
Cholesterol, Total: 179 mg/dL (ref 100–199)
HDL: 34 mg/dL — ABNORMAL LOW (ref >39)
LDL Chol Calc (NIH): 125 mg/dL — ABNORMAL HIGH (ref 0–99)
Triglycerides: 111 mg/dL (ref 0–149)
VLDL Cholesterol Cal: 20 mg/dL (ref 5–40)

## 2022-05-31 LAB — VITAMIN B12: Vitamin B-12: 317 pg/mL (ref 232–1245)

## 2022-05-31 LAB — VITAMIN D 25 HYDROXY (VIT D DEFICIENCY, FRACTURES): Vit D, 25-Hydroxy: 29.1 ng/mL — ABNORMAL LOW (ref 30.0–100.0)

## 2022-05-31 MED ORDER — ATORVASTATIN CALCIUM 20 MG PO TABS: 20.0000 mg | ORAL_TABLET | Freq: Every day | ORAL | 0 refills | Status: DC

## 2022-05-31 MED ORDER — CYANOCOBALAMIN 500 MCG PO TABS: 500.0000 ug | ORAL_TABLET | Freq: Every day | ORAL | 0 refills | Status: DC

## 2022-05-31 NOTE — Progress Notes (Signed)
LDL goal is less than 70, labs much improved but not at goal Please start taking atorvastatin '20mg'$  daily , avoid fatty fried foods  Vitamin B 12 level is on the lower end of normal , he should start taking B12 500 mcg daily  Vitamin D deficiency .eat foods rich in vitamin D like Cod liver oil,Salmon,Swordfish,Tuna fish,,Dairy and plant milks fortified with vitamin D,Sardines,Beef liver . Get early morning sunshine.   Please cancel the appointment in April as he was seen recently.

## 2022-05-31 NOTE — Progress Notes (Signed)
Called pt and leave a message for return call. [place]

## 2022-06-07 ENCOUNTER — Other Ambulatory Visit: Payer: Self-pay | Admitting: *Deleted

## 2022-06-07 DIAGNOSIS — M79604 Pain in right leg: Secondary | ICD-10-CM

## 2022-06-08 ENCOUNTER — Ambulatory Visit (HOSPITAL_COMMUNITY)
Admission: RE | Admit: 2022-06-08 | Discharge: 2022-06-08 | Disposition: A | Discharge status: Home / Self Care | Payer: Self-pay | Source: Ambulatory Visit | Attending: Vascular Surgery | Admitting: Vascular Surgery

## 2022-06-08 ENCOUNTER — Encounter: Payer: Self-pay | Admitting: Vascular Surgery

## 2022-06-08 ENCOUNTER — Ambulatory Visit (INDEPENDENT_AMBULATORY_CARE_PROVIDER_SITE_OTHER): Payer: Self-pay | Admitting: Vascular Surgery

## 2022-06-08 VITALS — BP 115/78 | HR 76 | Temp 98.5°F | Resp 20 | Ht 64.0 in | Wt 150.0 lb

## 2022-06-08 DIAGNOSIS — M25561 Pain in right knee: Secondary | ICD-10-CM

## 2022-06-08 DIAGNOSIS — M79604 Pain in right leg: Secondary | ICD-10-CM | POA: Insufficient documentation

## 2022-06-08 DIAGNOSIS — M25562 Pain in left knee: Secondary | ICD-10-CM

## 2022-06-08 LAB — VAS US ABI WITH/WO TBI
Left ABI: 1.08
Right ABI: 1.16

## 2022-06-08 NOTE — Progress Notes (Signed)
Patient ID: [name], male   DOB: 1960/04/22, 62 y.o.   MRN: [id number]  Reason for Consult: New Patient (Initial Visit)   Referred by Renee Rival, FNP  Subjective:     HPI:  [name] is a 62 y.o. male has right groin pain radiating down his right leg particularly bothers him at night.  He does not have any tissue loss or ulceration.  He is a former smoker also has diabetes.  States in the melanite he gets numbness in his feet.  He also has pain from his neck radiating down both arms affecting his hands.  He walks without limitation and denies any frank claudication.  Also has associated hyperlipidemia.  No previous vascular intervention.  Past Medical History:  Diagnosis Date   Arthritis of left knee    Cervical stenosis of spine    Diabetes mellitus without complication (HCC)    Hyperlipidemia LDL goal <70    Tobacco use    Family History  Problem Relation Age of Onset   Diabetes Father    Colon cancer Neg Hx    Prostate cancer Neg Hx    No past surgical history on file.  Short Social History:  Social History   Tobacco Use   Smoking status: Every Day    Types: Cigarettes   Smokeless tobacco: Current  Substance Use Topics   Alcohol use: Yes    Alcohol/week: 2.0 standard drinks of alcohol    Types: 2 Cans of beer per week    Comment: occas    No Known Allergies  Current Outpatient Medications  Medication Sig Dispense Refill   aspirin EC 81 MG tablet Take 81 mg by mouth daily.     atorvastatin (LIPITOR) 20 MG tablet Take 1 tablet (20 mg total) by mouth daily. 120 tablet 0   cyanocobalamin (VITAMIN B12) 500 MCG tablet Take 1 tablet (500 mcg total) by mouth daily. 90 tablet 0   gabapentin (NEURONTIN) 300 MG capsule Take 1 capsule (300 mg total) by mouth 3 (three) times daily. 90 capsule 3   ibuprofen (ADVIL) 600 MG tablet Take 1 tablet (600 mg total) by mouth every 8 (eight) hours as needed. 30 tablet 0   lisinopril (ZESTRIL) 2.5 MG tablet Take 1 tablet  (2.5 mg total) by mouth daily. 90 tablet 1   metFORMIN (GLUCOPHAGE) 1000 MG tablet Take 1 tablet (1,000 mg total) by mouth 2 (two) times daily with a meal. 180 tablet 0   ondansetron (ZOFRAN) 4 MG tablet Take 1 tablet (4 mg total) by mouth every 6 (six) hours. (Patient not taking: Reported on 02/07/2022) 12 tablet 0   polyethylene glycol powder (GLYCOLAX/MIRALAX) 17 GM/SCOOP powder Take 17 g by mouth daily as needed. (Patient not taking: Reported on 05/30/2022) 3350 g 2   ranitidine (ZANTAC) 150 MG capsule Take 1 capsule (150 mg total) by mouth daily. (Patient not taking: Reported on 02/07/2022) 30 capsule 0   No current facility-administered medications for this visit.    Review of Systems  Constitutional:  Constitutional negative. HENT: HENT negative.  Eyes: Eyes negative.  Respiratory: Respiratory negative.  Cardiovascular: Cardiovascular negative.  GI: Gastrointestinal negative.  Musculoskeletal: Musculoskeletal negative. Positive for leg pain.  Skin: Skin negative.  Neurological: Positive for numbness.  Hematologic: Hematologic/lymphatic negative.  Psychiatric: Psychiatric negative.        Objective:  Objective   Vitals:   06/08/22 0907  BP: 115/78  Pulse: 76  Resp: 20  Temp: 98.5 F (36.9 C)  SpO2: 97%    Physical Exam HENT:     Head: Normocephalic.     Nose: Nose normal.  Eyes:     Pupils: Pupils are equal, round, and reactive to light.  Cardiovascular:     Rate and Rhythm: Normal rate.     Pulses:          Radial pulses are 2+ on the right side and 2+ on the left side.       Popliteal pulses are 2+ on the right side and 2+ on the left side.       Dorsalis pedis pulses are 1+ on the right side and 2+ on the left side.  Pulmonary:     Effort: Pulmonary effort is normal.  Abdominal:     General: Abdomen is flat.  Musculoskeletal:        General: Normal range of motion.     Cervical back: Normal range of motion.     Right lower leg: No edema.     Left lower  leg: No edema.  Skin:    General: Skin is warm.     Capillary Refill: Capillary refill takes less than 2 seconds.  Neurological:     General: No focal deficit present.     Mental Status: He is alert.  Psychiatric:        Mood and Affect: Mood normal.        Behavior: Behavior normal.        Thought Content: Thought content normal.        Judgment: Judgment normal.    Data: ABI Findings:  +---------+------------------+-----+---------+--------+  Right   Rt Pressure (mmHg)IndexWaveform Comment   +---------+------------------+-----+---------+--------+  Brachial 119                                       +---------+------------------+-----+---------+--------+  PTA     142               1.07 triphasic          +---------+------------------+-----+---------+--------+  DP      154               1.16 triphasic          +---------+------------------+-----+---------+--------+  Audrie Lia               0.89 Normal             +---------+------------------+-----+---------+--------+   +---------+------------------+-----+---------+-------+  Left    Lt Pressure (mmHg)IndexWaveform Comment  +---------+------------------+-----+---------+-------+  Brachial 133                                      +---------+------------------+-----+---------+-------+  PTA     138               1.04 triphasic         +---------+------------------+-----+---------+-------+  DP      144               1.08 triphasic         +---------+------------------+-----+---------+-------+  Great Toe109               0.82 Normal            +---------+------------------+-----+---------+-------+   +-------+-----------+-----------+------------+------------+  ABI/TBIToday's ABIToday's TBIPrevious ABIPrevious TBI  +-------+-----------+-----------+------------+------------+  Right 1.16       0.89                                  +-------+-----------+-----------+------------+------------+  Left  1.08       0.82                                 +-------+-----------+-----------+------------+------------+       Summary:  Right: Resting right ankle-brachial index is within normal range. The  right toe-brachial index is normal.   Left: Resting left ankle-brachial index is within normal range. The left  toe-brachial index is normal.     Assessment/Plan:    62 year old male with history of numbness in his bilateral upper extremities bilateral feet pain radiating from his right groin down to his foot and ankle.  No tissue loss and ABIs are preserved with palpable pedal pulses bilaterally.  I recommend he continue aspirin and statin and follow-up with me on an as-needed basis.     Waynetta Sandy MD [facility]

## 2022-07-08 ENCOUNTER — Ambulatory Visit: Payer: Self-pay | Admitting: Nurse Practitioner

## 2022-07-20 ENCOUNTER — Encounter (HOSPITAL_COMMUNITY): Payer: Self-pay

## 2022-07-20 ENCOUNTER — Emergency Department (HOSPITAL_COMMUNITY)
Admission: EM | Admit: 2022-07-20 | Discharge: 2022-07-20 | Disposition: A | Discharge status: Home / Self Care | Payer: Self-pay | Attending: Emergency Medicine | Admitting: Emergency Medicine

## 2022-07-20 DIAGNOSIS — Z7982 Long term (current) use of aspirin: Secondary | ICD-10-CM | POA: Insufficient documentation

## 2022-07-20 DIAGNOSIS — Z7984 Long term (current) use of oral hypoglycemic drugs: Secondary | ICD-10-CM | POA: Insufficient documentation

## 2022-07-20 DIAGNOSIS — E1349 Other specified diabetes mellitus with other diabetic neurological complication: Secondary | ICD-10-CM | POA: Insufficient documentation

## 2022-07-20 DIAGNOSIS — Z87891 Personal history of nicotine dependence: Secondary | ICD-10-CM | POA: Insufficient documentation

## 2022-07-20 DIAGNOSIS — Z79899 Other long term (current) drug therapy: Secondary | ICD-10-CM | POA: Insufficient documentation

## 2022-07-20 NOTE — ED Provider Notes (Signed)
[place]MENT AT [facility] Provider Note   CSN: [id number] Arrival date & time: 07/20/22  1016     History  No chief complaint on file.   [name] is a 62 y.o. male history of cervical stenosis, smoking, diabetes on metformin presented with several months of right leg burning.  Patient has been seen multiple times for this by his primary care provider and has seen vascular with reassuring ABIs.  Patient states last A1c was 7.0 and they takes metformin daily.  Patient states the burning sensation starts in his foot and goes up to his groin but denies any back trauma.  Patient is currently on gabapentin which does not help.  Patient states he takes his aspirin daily as well.  Patient denies any symptoms in his upper extremities  Patient denies chest pain, shortness of breath, numbness, changes in gait, skin color changes, fevers, urinary/bowel incontinence, saddle esthesia, history of hernia, foot ulcers  Home Medications Prior to Admission medications   Medication Sig Start Date End Date Taking? Authorizing Provider  aspirin EC 81 MG tablet Take 81 mg by mouth daily.    [provider]  atorvastatin (LIPITOR) 20 MG tablet Take 1 tablet (20 mg total) by mouth daily. 05/31/22 05/31/23  Donell Beers, FNP  cyanocobalamin (VITAMIN B12) 500 MCG tablet Take 1 tablet (500 mcg total) by mouth daily. 05/31/22   Paseda, Baird Kay, FNP  gabapentin (NEURONTIN) 300 MG capsule Take 1 capsule (300 mg total) by mouth 3 (three) times daily. 03/07/22   Donell Beers, FNP  ibuprofen (ADVIL) 600 MG tablet Take 1 tablet (600 mg total) by mouth every 8 (eight) hours as needed. 05/30/22   Paseda, Baird Kay, FNP  lisinopril (ZESTRIL) 2.5 MG tablet Take 1 tablet (2.5 mg total) by mouth daily. 04/08/22   Donell Beers, FNP  metFORMIN (GLUCOPHAGE) 1000 MG tablet Take 1 tablet (1,000 mg total) by mouth 2 (two) times daily with a meal. 05/30/22 08/28/22  Paseda,  Baird Kay, FNP  ondansetron (ZOFRAN) 4 MG tablet Take 1 tablet (4 mg total) by mouth every 6 (six) hours. Patient not taking: Reported on 02/07/2022 02/03/13   Junious Silk, PA-C  polyethylene glycol powder (GLYCOLAX/MIRALAX) 17 GM/SCOOP powder Take 17 g by mouth daily as needed. Patient not taking: Reported on 05/30/2022 02/07/22   Donell Beers, FNP  ranitidine (ZANTAC) 150 MG capsule Take 1 capsule (150 mg total) by mouth daily. Patient not taking: Reported on 02/07/2022 02/03/13   Junious Silk, PA-C      Allergies    Patient has no known allergies.    Review of Systems   Review of Systems See HPI Physical Exam Updated Vital Signs BP 123/78 (BP Location: Right Arm)   Pulse 77   Temp 98.1 F (36.7 C) (Oral)   Resp 16   SpO2 99%  Physical Exam Constitutional:      General: He is in acute distress.  Cardiovascular:     Comments: 2+ bilateral dorsalis pedal pulses with regular rate Abdominal:     General: There is no distension.     Palpations: Abdomen is soft.     Tenderness: There is no abdominal tenderness. There is no guarding or rebound.     Hernia: No hernia is present.  Musculoskeletal:     Comments: Back: No midline tenderness or step-off/crepitus/or mass palpated Full active range of motion in right hip/knee/ankle Negative straight leg test Patient has a was not reproducible when  palpating piriformis  Skin:    General: Skin is warm and dry.     Capillary Refill: Capillary refill takes less than 2 seconds.     Comments: No overlying skin color changes or ulcers noted  Neurological:     Mental Status: He is alert.     Comments: Able to walk without abnormality Sensation intact in all 4 limbs.     ED Results / Procedures / Treatments   Labs (all labs ordered are listed, but only abnormal results are displayed) Labs Reviewed - No data to display  EKG None  Radiology No results found.  Procedures Procedures    Medications Ordered in  ED Medications - No data to display  ED Course/ Medical Decision Making/ A&P                             Medical Decision Making  [name] 62 y.o. presented today for right leg paresthesia. Working DDx that I considered at this time includes, but not limited to, diabetic neuropathy, PAD, spinal cord lesion, exacerbation of cervical stenosis, diabetic foot ulcer, neurovascular compromise, hernia.  R/o DDx: PAD, spinal cord lesion, exacerbation of cervical stenosis, diabetic foot ulcer, neurovascular compromise, hernia: These are considered less likely due to history of present illness and physical exam findings  Review of prior external notes: 06/08/2022 vascular  Unique Tests and My Interpretation: None  Discussion with Independent Historian: None  Discussion of Management of Tests: None  Risk: Low: based on diagnostic testing/clinical impression and treatment plan  Risk Stratification Score: None  Plan: Patient presented for right leg paresthesias. On exam patient was no acute distress and stable vitals.  On exam patient was able to walk without abnormalities but does state that he has a burning sensation in his right leg that goes from his right foot to his groin.  Patient is been seen by multiple specialists and primary care provider in the past 7 months presents with reassuring workup.  Patient seen vascular with reassuring ABIs low suspicion for any vascular cause for patient's symptoms.  On exam patient had good pulses motor sensation and with a negative straight leg test and negative back exam I have low suspicion for any back or spinal cord pathology causing his pain.  Patient's last A1c was 7.0 at the end of last year however I suspect patient may have diabetic neuropathy going on as he has had good ABIs via vascular and no history of trauma or other concerning features.  Patient will be discharged with primary care follow-up as he is already on gabapentin.  Spoke to the patient  about how this may be diabetic neuropathy and that patient will need to follow-up with his primary care provider in the next week as he has not seen his primary care provider since December to repeat his A1c as it has been greater than 3 months.  I spoke to the patient about how vascular cleared him from any kind of vascular pathology and that he is already on gabapentin and that he will need to follow-up with his primary care provider for for long-term management.  I spoke to the patient about not getting labs today as I do not think it would change management patient verbally agreed to this.  Patient was given return precautions. Patient stable for discharge at this time.  Patient verbalized understanding of plan.         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

## 2022-07-20 NOTE — Discharge Instructions (Addendum)
Haga un seguimiento con su proveedor de atencin primaria sobre los sntomas recientes y la visita a la sala de Sports administrator. [employer], los [employer] de laboratorio se Programmer, applications, ya que los anlisis de laboratorio recientes de su proveedor de atencin primaria son tranquilizadores, Insurance underwriter a realizarlos dentro de la prxima semana. Contine usando gabapentina, estatina y metformina segn lo recetado y revise sus pies diariamente para Engineer, manufacturing cualquier herida. Lo ms probable es que tenga neuropata diabtica, que es muy [place], [place] su proveedor de atencin primaria deber tratarla a Air cabin crew. Si comienza a tener [employer], regrese a Sports administrator.  Esta traduccin se realiz con [employer] y, por lo [place], puede haber algunos errores gramaticales en la traduccin.

## 2022-07-20 NOTE — ED Triage Notes (Signed)
Pt presents with c/o leg burning on the right leg since November of last year. Pt denies any injury.

## 2022-07-21 ENCOUNTER — Telehealth: Payer: Self-pay

## 2022-07-21 NOTE — Transitions of Care (Post Inpatient/ED Visit) (Signed)
   07/21/2022  Name: [name] MRN: [id number] DOB: March 22, 1960  Today's TOC FU Call Status: Today's TOC FU Call Status:: Unsuccessul Call (1st Attempt) Unsuccessful Call (1st Attempt) Date: 07/21/22  Attempted to reach the patient regarding the most recent Inpatient/ED visit.  Follow Up Plan: Additional outreach attempts will be made to reach the patient to complete the Transitions of Care (Post Inpatient/ED visit) call.   Signature Renelda Loma RMA

## 2022-07-22 ENCOUNTER — Telehealth: Payer: Self-pay

## 2022-07-22 NOTE — Transitions of Care (Post Inpatient/ED Visit) (Signed)
   07/22/2022  Name: [name] MRN: [id number] DOB: [birth year]  Today's TOC FU Call Status: Today's TOC FU Call Status:: Unsuccessful Call (2nd Attempt) Unsuccessful Call (2nd Attempt) Date: 07/22/22 [facility] FU Call Complete Date: 07/22/22  Attempted to reach the patient regarding the most recent Inpatient/ED visit.  Follow Up Plan: Additional outreach attempts will be made to reach the patient to complete the Transitions of Care (Post Inpatient/ED visit) call.   Signature Renelda Loma RMA

## 2022-07-25 ENCOUNTER — Telehealth: Payer: Self-pay

## 2022-07-25 NOTE — Transitions of Care (Post Inpatient/ED Visit) (Cosign Needed)
   07/25/2022  Name: [name] MRN: [id number] DOB: [birth year]  Today's TOC FU Call Status: Today's TOC FU Call Status:: Unsuccessful Call (3rd Attempt) TOC FU Call Complete Date: 07/25/22  Attempted to reach the patient regarding the most recent Inpatient/ED visit.  Follow Up Plan: No further outreach attempts will be made at this time. We have been unable to contact the patient.  Signature Renelda Loma RMA

## 2022-07-26 ENCOUNTER — Encounter: Payer: Self-pay | Admitting: Nurse Practitioner

## 2022-07-26 ENCOUNTER — Ambulatory Visit (INDEPENDENT_AMBULATORY_CARE_PROVIDER_SITE_OTHER): Payer: Self-pay | Admitting: Nurse Practitioner

## 2022-07-26 VITALS — BP 114/66 | HR 79 | Temp 97.5°F | Ht 63.0 in | Wt 146.4 lb

## 2022-07-26 DIAGNOSIS — M4802 Spinal stenosis, cervical region: Secondary | ICD-10-CM

## 2022-07-26 DIAGNOSIS — M25561 Pain in right knee: Secondary | ICD-10-CM

## 2022-07-26 DIAGNOSIS — E1165 Type 2 diabetes mellitus with hyperglycemia: Secondary | ICD-10-CM

## 2022-07-26 DIAGNOSIS — I10 Essential (primary) hypertension: Secondary | ICD-10-CM

## 2022-07-26 DIAGNOSIS — E782 Mixed hyperlipidemia: Secondary | ICD-10-CM

## 2022-07-26 DIAGNOSIS — G8929 Other chronic pain: Secondary | ICD-10-CM | POA: Insufficient documentation

## 2022-07-26 DIAGNOSIS — M25562 Pain in left knee: Secondary | ICD-10-CM

## 2022-07-26 MED ORDER — METFORMIN HCL 1000 MG PO TABS: 1000.0000 mg | ORAL_TABLET | Freq: Two times a day (BID) | ORAL | 3 refills | Status: DC

## 2022-07-26 MED ORDER — METHYLPREDNISOLONE 4 MG PO TBPK: ORAL_TABLET | ORAL | 0 refills | Status: DC

## 2022-07-26 MED ORDER — GABAPENTIN 300 MG PO CAPS: 300.0000 mg | ORAL_CAPSULE | Freq: Three times a day (TID) | ORAL | 3 refills | Status: DC

## 2022-07-26 MED ORDER — IBUPROFEN 600 MG PO TABS: 600.0000 mg | ORAL_TABLET | Freq: Three times a day (TID) | ORAL | 1 refills | Status: DC | PRN

## 2022-07-26 MED ORDER — LISINOPRIL 2.5 MG PO TABS: 2.5000 mg | ORAL_TABLET | Freq: Every day | ORAL | 1 refills | Status: DC

## 2022-07-26 NOTE — Assessment & Plan Note (Addendum)
Lab Results  Component Value Date   CHOL 179 05/30/2022   HDL 34 (L) 05/30/2022   LDLCALC 125 (H) 05/30/2022   TRIG 111 05/30/2022   CHOLHDL 5.3 (H) 05/30/2022  Continue atorvastatin 20 mg daily need to take medication daily was discussed Check lipid panel next visit Avoid fatty fried foods

## 2022-07-26 NOTE — Assessment & Plan Note (Addendum)
No complaints today - gabapentin (NEURONTIN) 300 MG capsule; Take 1 capsule (300 mg total) by mouth 3 (three) times daily.  Dispense: 90 capsule; Refill: 3 - methylPREDNISolone (MEDROL DOSEPAK) 4 MG TBPK tablet; Please take as instructed on the packaging  Dispense: 1 each; Refill: 0 - ibuprofen (ADVIL) 600 MG tablet; Take 1 tablet (600 mg total) by mouth every 8 (eight) hours as needed.  Dispense: 30 tablet; Refill: 1

## 2022-07-26 NOTE — Patient Instructions (Addendum)
1. Type 2 diabetes mellitus with hyperglycemia, without long-term current use of insulin (HCC)  - metFORMIN (GLUCOPHAGE) 1000 MG tablet; Take 1 tablet (1,000 mg total) by mouth 2 (two) times daily with a meal.  Dispense: 180 tablet; Refill: 3 - lisinopril (ZESTRIL) 2.5 MG tablet; Take 1 tablet (2.5 mg total) by mouth daily.  Dispense: 90 tablet; Refill: 1  2. Cervical stenosis of spine  - gabapentin (NEURONTIN) 300 MG capsule; Take 1 capsule (300 mg total) by mouth 3 (three) times daily.  Dispense: 90 capsule; Refill: 3  3. Chronic pain of right knee  - gabapentin (NEURONTIN) 300 MG capsule; Take 1 capsule (300 mg total) by mouth 3 (three) times daily.  Dispense: 90 capsule; Refill: 3 - methylPREDNISolone (MEDROL DOSEPAK) 4 MG TBPK tablet; Please take as instructed on the packaging  Dispense: 1 each; Refill: 0 - ibuprofen (ADVIL) 600 MG tablet; Take 1 tablet (600 mg total) by mouth every 8 (eight) hours as needed.  Dispense: 30 tablet; Refill: 1   It is important that you exercise regularly at least 30 minutes 5 times a week as tolerated  Think about what you will eat, plan ahead. Choose " clean, green, fresh or frozen" over canned, processed or packaged foods which are more sugary, salty and fatty. 70 to 75% of food eaten should be vegetables and fruit. Three meals at set times with snacks allowed between meals, but they must be fruit or vegetables. Aim to eat over a 12 hour period , example 7 am to 7 pm, and STOP after  your last meal of the day. Drink water,generally about 64 ounces per day, no other drink is as healthy. Fruit juice is best enjoyed in a healthy way, by EATING the fruit.  Thanks for choosing [facility] we consider it a privelige to serve you.

## 2022-07-26 NOTE — Assessment & Plan Note (Signed)
Lab Results  Component Value Date   HGBA1C 7.1 (A) 05/30/2022  Metformin 1000 mg twice daily refilled Patient counseled on low-carb modified diet Follow-up as planned

## 2022-07-26 NOTE — Assessment & Plan Note (Signed)
Patient declined toradol injections in the office today Gabapentin and ibuprofen refilled Medrol Dosepak ordered.  Patient told to not to take ibuprofen until after completion of Medrol dose pack.  Please take Tylenol OTC as needed Follow-up with orthopedics, he will most likely benefit from joint injection. Application of heat or ice encouraged - gabapentin (NEURONTIN) 300 MG capsule; Take 1 capsule (300 mg total) by mouth 3 (three) times daily.  Dispense: 90 capsule; Refill: 3 - methylPREDNISolone (MEDROL DOSEPAK) 4 MG TBPK tablet; Please take as instructed on the packaging  Dispense: 1 each; Refill: 0 - ibuprofen (ADVIL) 600 MG tablet; Take 1 tablet (600 mg total) by mouth every 8 (eight) hours as needed.  Dispense: 30 tablet; Refill: 1

## 2022-07-26 NOTE — Assessment & Plan Note (Signed)
BP Readings from Last 3 Encounters:  07/26/22 114/66  07/20/22 118/68  06/08/22 115/78   HTN Controlled .  Lisinopril 2.5 mg daily refilled Continue current medications. No changes in management. Discussed DASH diet and dietary sodium restrictions Continue to increase dietary efforts and exercise.

## 2022-07-26 NOTE — Progress Notes (Signed)
Established Patient Office Visit  Subjective:  Patient ID: [name], male    DOB: Dec 20, 1960  Age: 62 y.o. MRN: [id number]  CC:  Chief Complaint  Patient presents with   Leg Pain    Right lower abdomen and leg burning.    HPI [name] is a 62 y.o. male  has a past medical history of Arthritis of left knee, Cervical stenosis of spine, Diabetes mellitus without complication (HCC), Hyperlipidemia LDL goal <70, and Tobacco use.  Patient presents with complaints of ongoing bilateral knee pain.  He currently rates his pain as 10/10 has mild swelling on his left knee, able to ambulate using a cane.  Currently has gabapentin ordered but I doubt his compliance with this medication, he has been out of ibuprofen.  He has also been evaluated by vascular to rule out PAD, already reassuring ABI.  Orthopedics thinks that the patient will benefit from surgery but he is currently uninsured.  No complaints of fever, chills chest pain, shortness of breath, changes in gait skin color changes urinary or bowel incontinence    Past Medical History:  Diagnosis Date   Arthritis of left knee    Cervical stenosis of spine    Diabetes mellitus without complication (HCC)    Hyperlipidemia LDL goal <70    Tobacco use     History reviewed. No pertinent surgical history.  Family History  Problem Relation Age of Onset   Diabetes Father    Colon cancer Neg Hx    Prostate cancer Neg Hx     Social History   Socioeconomic History   Marital status: Single    Spouse name: Not on file   Number of children: 9   Years of education: Not on file   Highest education level: Not on file  Occupational History   Not on file  Tobacco Use   Smoking status: Every Day    Packs/day: .25    Types: Cigarettes   Smokeless tobacco: Current  Substance and Sexual Activity   Alcohol use: Yes    Alcohol/week: 2.0 standard drinks of alcohol    Types: 2 Cans of beer per week    Comment: occas   Drug use: Not Currently     Comment: ued to smoke weed   Sexual activity: Not on file  Other Topics Concern   Not on file  Social History Narrative   Lives alone with a room mate .    Social Determinants of Health   Financial Resource Strain: Not on file  Food Insecurity: Not on file  Transportation Needs: Not on file  Physical Activity: Not on file  Stress: Not on file  Social Connections: Not on file  Intimate Partner Violence: Not on file    Outpatient Medications Prior to Visit  Medication Sig Dispense Refill   atorvastatin (LIPITOR) 20 MG tablet Take 1 tablet (20 mg total) by mouth daily. 120 tablet 0   gabapentin (NEURONTIN) 300 MG capsule Take 1 capsule (300 mg total) by mouth 3 (three) times daily. 90 capsule 3   lisinopril (ZESTRIL) 2.5 MG tablet Take 1 tablet (2.5 mg total) by mouth daily. 90 tablet 1   aspirin EC 81 MG tablet Take 81 mg by mouth daily. (Patient not taking: Reported on 07/26/2022)     cyanocobalamin (VITAMIN B12) 500 MCG tablet Take 1 tablet (500 mcg total) by mouth daily. (Patient not taking: Reported on 07/26/2022) 90 tablet 0   ondansetron (ZOFRAN) 4 MG tablet Take 1 tablet (  4 mg total) by mouth every 6 (six) hours. (Patient not taking: Reported on 02/07/2022) 12 tablet 0   polyethylene glycol powder (GLYCOLAX/MIRALAX) 17 GM/SCOOP powder Take 17 g by mouth daily as needed. (Patient not taking: Reported on 05/30/2022) 3350 g 2   ranitidine (ZANTAC) 150 MG capsule Take 1 capsule (150 mg total) by mouth daily. (Patient not taking: Reported on 02/07/2022) 30 capsule 0   ibuprofen (ADVIL) 600 MG tablet Take 1 tablet (600 mg total) by mouth every 8 (eight) hours as needed. (Patient not taking: Reported on 07/26/2022) 30 tablet 0   metFORMIN (GLUCOPHAGE) 1000 MG tablet Take 1 tablet (1,000 mg total) by mouth 2 (two) times daily with a meal. (Patient not taking: Reported on 07/26/2022) 180 tablet 0   No facility-administered medications prior to visit.    No Known Allergies  ROS Review of  Systems  Constitutional:  Negative for activity change, appetite change, chills, diaphoresis, fatigue, fever and unexpected weight change.  HENT:  Negative for congestion, dental problem, drooling and ear discharge.   Eyes:  Negative for pain, discharge, redness and itching.  Respiratory:  Negative for apnea, cough, choking, chest tightness, shortness of breath and wheezing.   Cardiovascular: Negative.  Negative for chest pain, palpitations and leg swelling.  Gastrointestinal:  Negative for abdominal distention, abdominal pain, anal bleeding, blood in stool, constipation, diarrhea and vomiting.  Endocrine: Negative for polydipsia, polyphagia and polyuria.  Genitourinary:  Negative for difficulty urinating, flank pain, frequency and genital sores.  Musculoskeletal:  Positive for arthralgias and gait problem. Negative for back pain and joint swelling.  Skin:  Negative for color change, pallor and rash.  Neurological:  Negative for dizziness, facial asymmetry, light-headedness, numbness and headaches.  Psychiatric/Behavioral:  Negative for agitation, behavioral problems, confusion, hallucinations, self-injury, sleep disturbance and suicidal ideas.       Objective:    Physical Exam Vitals and nursing note reviewed.  Constitutional:      General: He is not in acute distress.    Appearance: Normal appearance. He is obese. He is not ill-appearing, toxic-appearing or diaphoretic.  HENT:     Mouth/Throat:     Mouth: Mucous membranes are moist.     Pharynx: Oropharynx is clear. No oropharyngeal exudate or posterior oropharyngeal erythema.  Eyes:     General: No scleral icterus.       Right eye: No discharge.        Left eye: No discharge.     Extraocular Movements: Extraocular movements intact.     Conjunctiva/sclera: Conjunctivae normal.  Cardiovascular:     Rate and Rhythm: Normal rate and regular rhythm.     Pulses: Normal pulses.     Heart sounds: Normal heart sounds. No murmur heard.     No friction rub. No gallop.  Pulmonary:     Effort: Pulmonary effort is normal. No respiratory distress.     Breath sounds: Normal breath sounds. No stridor. No wheezing, rhonchi or rales.  Chest:     Chest wall: No tenderness.  Abdominal:     General: There is no distension.     Palpations: Abdomen is soft.     Tenderness: There is no abdominal tenderness. There is no right CVA tenderness, left CVA tenderness or guarding.  Musculoskeletal:        General: Tenderness present. No swelling, deformity or signs of injury.     Right lower leg: No edema.     Left lower leg: No edema.     Comments:  Tenderness on range of motion of bilateral knee, left knee appears mildly swelling.  No redness noted.  Skin warm and dry.  Able to ambulate with a cane  Skin:    General: Skin is warm and dry.     Capillary Refill: Capillary refill takes less than 2 seconds.     Coloration: Skin is not jaundiced or pale.     Findings: No bruising, erythema or lesion.  Neurological:     Mental Status: He is alert and oriented to person, place, and time.     Motor: No weakness.     Coordination: Coordination normal.     Gait: Gait abnormal.  Psychiatric:        Mood and Affect: Mood normal.        Behavior: Behavior normal.        Thought Content: Thought content normal.        Judgment: Judgment normal.     BP 114/66   Pulse 79   Temp (!) 97.5 F (36.4 C)   Ht 5\' 3"  (1.6 m)   Wt 146 lb 6.4 oz (66.4 kg)   SpO2 98%   BMI 25.93 kg/m  Wt Readings from Last 3 Encounters:  07/26/22 146 lb 6.4 oz (66.4 kg)  06/08/22 150 lb (68 kg)  05/30/22 150 lb (68 kg)    Lab Results  Component Value Date   TSH 0.931 05/10/2010   Lab Results  Component Value Date   WBC 7.0 01/13/2022   HGB 14.1 01/13/2022   HCT 42.1 01/13/2022   MCV 94.4 01/13/2022   PLT 220 01/13/2022   Lab Results  Component Value Date   NA 135 03/07/2022   K 3.9 03/07/2022   CO2 23 03/07/2022   GLUCOSE 171 (H) 03/07/2022   BUN 15  03/07/2022   CREATININE 0.77 03/07/2022   BILITOT 1.2 01/13/2022   ALKPHOS 81 01/13/2022   AST 21 01/13/2022   ALT 17 01/13/2022   PROT 7.3 01/13/2022   ALBUMIN 3.9 01/13/2022   CALCIUM 10.0 03/07/2022   ANIONGAP 9 01/13/2022   EGFR 101 03/07/2022   Lab Results  Component Value Date   CHOL 179 05/30/2022   Lab Results  Component Value Date   HDL 34 (L) 05/30/2022   Lab Results  Component Value Date   LDLCALC 125 (H) 05/30/2022   Lab Results  Component Value Date   TRIG 111 05/30/2022   Lab Results  Component Value Date   CHOLHDL 5.3 (H) 05/30/2022   Lab Results  Component Value Date   HGBA1C 7.1 (A) 05/30/2022      Assessment & Plan:   Problem List Items Addressed This Visit       Cardiovascular and Mediastinum   Primary hypertension    BP Readings from Last 3 Encounters:  07/26/22 114/66  07/20/22 118/68  06/08/22 115/78   HTN Controlled .  Lisinopril 2.5 mg daily refilled Continue current medications. No changes in management. Discussed DASH diet and dietary sodium restrictions Continue to increase dietary efforts and exercise.         Relevant Medications   lisinopril (ZESTRIL) 2.5 MG tablet     Endocrine   Type 2 diabetes mellitus with hyperglycemia (HCC)    Lab Results  Component Value Date   HGBA1C 7.1 (A) 05/30/2022  Metformin 1000 mg twice daily refilled Patient counseled on low-carb modified diet Follow-up as planned      Relevant Medications   metFORMIN (GLUCOPHAGE) 1000 MG tablet  lisinopril (ZESTRIL) 2.5 MG tablet     Other   Chronic pain of right knee    Patient declined toradol injections in the office today Gabapentin and ibuprofen refilled Medrol Dosepak ordered.  Patient told to not to take ibuprofen until after completion of Medrol dose pack.  Please take Tylenol OTC as needed Follow-up with orthopedics, he will most likely benefit from joint injection. Application of heat or ice encouraged - gabapentin (NEURONTIN)  300 MG capsule; Take 1 capsule (300 mg total) by mouth 3 (three) times daily.  Dispense: 90 capsule; Refill: 3 - methylPREDNISolone (MEDROL DOSEPAK) 4 MG TBPK tablet; Please take as instructed on the packaging  Dispense: 1 each; Refill: 0 - ibuprofen (ADVIL) 600 MG tablet; Take 1 tablet (600 mg total) by mouth every 8 (eight) hours as needed.  Dispense: 30 tablet; Refill: 1       Relevant Medications   gabapentin (NEURONTIN) 300 MG capsule   methylPREDNISolone (MEDROL DOSEPAK) 4 MG TBPK tablet   ibuprofen (ADVIL) 600 MG tablet (Start on 07/30/2022)   Cervical stenosis of spine    No complaints today - gabapentin (NEURONTIN) 300 MG capsule; Take 1 capsule (300 mg total) by mouth 3 (three) times daily.  Dispense: 90 capsule; Refill: 3 - methylPREDNISolone (MEDROL DOSEPAK) 4 MG TBPK tablet; Please take as instructed on the packaging  Dispense: 1 each; Refill: 0 - ibuprofen (ADVIL) 600 MG tablet; Take 1 tablet (600 mg total) by mouth every 8 (eight) hours as needed.  Dispense: 30 tablet; Refill: 1       Relevant Medications   gabapentin (NEURONTIN) 300 MG capsule   Mixed hyperlipidemia    Lab Results  Component Value Date   CHOL 179 05/30/2022   HDL 34 (L) 05/30/2022   LDLCALC 125 (H) 05/30/2022   TRIG 111 05/30/2022   CHOLHDL 5.3 (H) 05/30/2022  Continue atorvastatin 20 mg daily need to take medication daily was discussed Check lipid panel next visit Avoid fatty fried foods      Relevant Medications   lisinopril (ZESTRIL) 2.5 MG tablet   Chronic pain of left knee - Primary    Patient declined toradol injections in the office today Gabapentin and ibuprofen refilled Medrol Dosepak ordered.  Patient told to not to take ibuprofen until after completion of Medrol dose pack.  Please take Tylenol OTC as needed Follow-up with orthopedics, he will most likely benefit from joint injection. Application of heat or ice encouraged - gabapentin (NEURONTIN) 300 MG capsule; Take 1 capsule (300 mg  total) by mouth 3 (three) times daily.  Dispense: 90 capsule; Refill: 3 - methylPREDNISolone (MEDROL DOSEPAK) 4 MG TBPK tablet; Please take as instructed on the packaging  Dispense: 1 each; Refill: 0 - ibuprofen (ADVIL) 600 MG tablet; Take 1 tablet (600 mg total) by mouth every 8 (eight) hours as needed.  Dispense: 30 tablet; Refill: 1       Relevant Medications   gabapentin (NEURONTIN) 300 MG capsule   methylPREDNISolone (MEDROL DOSEPAK) 4 MG TBPK tablet   ibuprofen (ADVIL) 600 MG tablet (Start on 07/30/2022)    Meds ordered this encounter  Medications   metFORMIN (GLUCOPHAGE) 1000 MG tablet    Sig: Take 1 tablet (1,000 mg total) by mouth 2 (two) times daily with a meal.    Dispense:  180 tablet    Refill:  3   lisinopril (ZESTRIL) 2.5 MG tablet    Sig: Take 1 tablet (2.5 mg total) by mouth daily.    Dispense:  90 tablet    Refill:  1   gabapentin (NEURONTIN) 300 MG capsule    Sig: Take 1 capsule (300 mg total) by mouth 3 (three) times daily.    Dispense:  90 capsule    Refill:  3   DISCONTD: ibuprofen (ADVIL) 600 MG tablet    Sig: Take 1 tablet (600 mg total) by mouth every 8 (eight) hours as needed.    Dispense:  30 tablet    Refill:  1   methylPREDNISolone (MEDROL DOSEPAK) 4 MG TBPK tablet    Sig: Please take as instructed on the packaging    Dispense:  1 each    Refill:  0   ibuprofen (ADVIL) 600 MG tablet    Sig: Take 1 tablet (600 mg total) by mouth every 8 (eight) hours as needed.    Dispense:  30 tablet    Refill:  1    Follow-up: Return in about 3 months (around 10/26/2022).    Donell Beers, FNP

## 2022-07-26 NOTE — Assessment & Plan Note (Signed)
Patient declined toradol injections in the office today Gabapentin and ibuprofen refilled Medrol Dosepak ordered.  Patient told to not to take ibuprofen until after completion of Medrol dose pack.  Please take Tylenol OTC as needed Follow-up with orthopedics, he will most likely benefit from joint injection. Application of heat or ice encouraged - gabapentin (NEURONTIN) 300 MG capsule; Take 1 capsule (300 mg total) by mouth 3 (three) times daily.  Dispense: 90 capsule; Refill: 3 - methylPREDNISolone (MEDROL DOSEPAK) 4 MG TBPK tablet; Please take as instructed on the packaging  Dispense: 1 each; Refill: 0 - ibuprofen (ADVIL) 600 MG tablet; Take 1 tablet (600 mg total) by mouth every 8 (eight) hours as needed.  Dispense: 30 tablet; Refill: 1  

## 2022-09-06 ENCOUNTER — Encounter (HOSPITAL_COMMUNITY): Payer: Self-pay

## 2022-09-06 ENCOUNTER — Emergency Department (HOSPITAL_COMMUNITY)
Admission: EM | Admit: 2022-09-06 | Discharge: 2022-09-06 | Disposition: A | Discharge status: Home / Self Care | Payer: Self-pay | Attending: Emergency Medicine | Admitting: Emergency Medicine

## 2022-09-06 ENCOUNTER — Other Ambulatory Visit: Payer: Self-pay

## 2022-09-06 ENCOUNTER — Emergency Department (HOSPITAL_COMMUNITY): Payer: Self-pay

## 2022-09-06 DIAGNOSIS — R1084 Generalized abdominal pain: Secondary | ICD-10-CM | POA: Insufficient documentation

## 2022-09-06 DIAGNOSIS — K610 Anal abscess: Secondary | ICD-10-CM | POA: Insufficient documentation

## 2022-09-06 LAB — COMPREHENSIVE METABOLIC PANEL
ALT: 16 U/L (ref 0–44)
AST: 15 U/L (ref 15–41)
Albumin: 3.6 g/dL (ref 3.5–5.0)
Alkaline Phosphatase: 72 U/L (ref 38–126)
Anion gap: 7 (ref 5–15)
BUN: 17 mg/dL (ref 8–23)
CO2: 26 mmol/L (ref 22–32)
Calcium: 8.8 mg/dL — ABNORMAL LOW (ref 8.9–10.3)
Chloride: 104 mmol/L (ref 98–111)
Creatinine, Ser: 0.6 mg/dL — ABNORMAL LOW (ref 0.61–1.24)
GFR, Estimated: >60 mL/min (ref >60)
Glucose, Bld: 144 mg/dL — ABNORMAL HIGH (ref 70–99)
Potassium: 3.7 mmol/L (ref 3.5–5.1)
Sodium: 137 mmol/L (ref 135–145)
Total Bilirubin: 1.7 mg/dL — ABNORMAL HIGH (ref 0.3–1.2)
Total Protein: 7.1 g/dL (ref 6.5–8.1)

## 2022-09-06 LAB — CBC WITH DIFFERENTIAL/PLATELET
Abs Immature Granulocytes: 0.03 10*3/uL (ref 0.00–0.07)
Basophils Absolute: 0 10*3/uL (ref 0.0–0.1)
Basophils Relative: 0 %
Eosinophils Absolute: 0.2 10*3/uL (ref 0.0–0.5)
Eosinophils Relative: 2 %
HCT: 38 % — ABNORMAL LOW (ref 39.0–52.0)
Hemoglobin: 12.6 g/dL — ABNORMAL LOW (ref 13.0–17.0)
Immature Granulocytes: 0 %
Lymphocytes Relative: 9 %
Lymphs Abs: 0.9 10*3/uL (ref 0.7–4.0)
MCH: 31.4 pg (ref 26.0–34.0)
MCHC: 33.2 g/dL (ref 30.0–36.0)
MCV: 94.8 fL (ref 80.0–100.0)
Monocytes Absolute: 0.7 10*3/uL (ref 0.1–1.0)
Monocytes Relative: 7 %
Neutro Abs: 8.9 10*3/uL — ABNORMAL HIGH (ref 1.7–7.7)
Neutrophils Relative %: 82 %
Platelets: 169 10*3/uL (ref 150–400)
RBC: 4.01 MIL/uL — ABNORMAL LOW (ref 4.22–5.81)
RDW: 12.9 % (ref 11.5–15.5)
WBC: 10.8 10*3/uL — ABNORMAL HIGH (ref 4.0–10.5)
nRBC: 0 % (ref 0.0–0.2)

## 2022-09-06 LAB — LIPASE, BLOOD: Lipase: 33 U/L (ref 11–51)

## 2022-09-06 LAB — TROPONIN I (HIGH SENSITIVITY): Troponin I (High Sensitivity): 2 ng/L (ref <18)

## 2022-09-06 MED ORDER — IOHEXOL 300 MG/ML  SOLN
100.0000 mL | Freq: Once | INTRAMUSCULAR | Status: AC | PRN
  Administered 2022-09-06: 100 mL via INTRAVENOUS

## 2022-09-06 MED ORDER — FENTANYL CITRATE PF 50 MCG/ML IJ SOSY
50.0000 ug | PREFILLED_SYRINGE | Freq: Once | INTRAMUSCULAR | Status: AC
  Administered 2022-09-06: 50 ug via INTRAVENOUS
  Filled 2022-09-06: qty 1

## 2022-09-06 MED ORDER — SODIUM CHLORIDE (PF) 0.9 % IJ SOLN
INTRAMUSCULAR | Status: AC
  Filled 2022-09-06: qty 50

## 2022-09-06 MED ORDER — AMOXICILLIN-POT CLAVULANATE 875-125 MG PO TABS: 1.0000 | ORAL_TABLET | Freq: Two times a day (BID) | ORAL | 0 refills | Status: DC

## 2022-09-06 MED ORDER — LIDOCAINE-EPINEPHRINE (PF) 2 %-1:200000 IJ SOLN
20.0000 mL | Freq: Once | INTRAMUSCULAR | Status: AC
  Administered 2022-09-06: 20 mL
  Filled 2022-09-06: qty 20

## 2022-09-06 NOTE — ED Provider Notes (Signed)
[place] EMERGENCY DEPARTMENT AT [facility] Provider Note   CSN: [id number] Arrival date & time: 09/06/22  4098     History Chief Complaint  Patient presents with   Abdominal Pain  Medical interpreter was used for all of the below evaluation  HPI [name] is a 62 y.o. male presenting for intermittent rectal pain.  62 year old male minimal medical history.  States that he is having rectal pain that feels like a burning sensation.  It is worse when he goes to the bathroom or performs any Valsalva. He denies fevers chills, nausea vomiting, some shortness of breath.  History of similar that resolved spontaneously but current episode has been going on for 3 days and worsening. Otherwise minimal medical history. Patient's recorded medical, surgical, social, medication list and allergies were reviewed in the Snapshot window as part of the initial history.   Review of Systems   Review of Systems  Constitutional:  Negative for chills and fever.  HENT:  Negative for ear pain and sore throat.   Eyes:  Negative for pain and visual disturbance.  Respiratory:  Negative for cough and shortness of breath.   Cardiovascular:  Negative for chest pain and palpitations.  Gastrointestinal:  Positive for rectal pain. Negative for abdominal pain and vomiting.  Genitourinary:  Negative for dysuria and hematuria.  Musculoskeletal:  Negative for arthralgias and back pain.  Skin:  Negative for color change and rash.  Neurological:  Negative for seizures and syncope.  All other systems reviewed and are negative.   Physical Exam Updated Vital Signs BP (!) 113/59   Pulse 93   Temp 98.7 F (37.1 C) (Oral)   Resp 17   Ht 5\' 3"  (1.6 m)   Wt 65.8 kg   SpO2 99%   BMI 25.69 kg/m  Physical Exam Vitals and nursing note reviewed. Exam conducted with a chaperone present.  Constitutional:      General: He is not in acute distress.    Appearance: He is well-developed.  HENT:     Head:  Normocephalic and atraumatic.  Eyes:     Conjunctiva/sclera: Conjunctivae normal.  Cardiovascular:     Rate and Rhythm: Normal rate and regular rhythm.     Heart sounds: No murmur heard. Pulmonary:     Effort: Pulmonary effort is normal. No respiratory distress.     Breath sounds: Normal breath sounds.  Abdominal:     Palpations: Abdomen is soft.     Tenderness: There is no abdominal tenderness.  Genitourinary:    Comments: Obvious induration of the perianal tissue bilaterally.  Tender to palpation more notable on the left gluteal tissues. No palpable abscess from external exam.  Internal exam without palpable abscess.  Significant distress on internal exam. Musculoskeletal:        General: No swelling.     Cervical back: Neck supple.  Skin:    General: Skin is warm and dry.     Capillary Refill: Capillary refill takes less than 2 seconds.  Neurological:     Mental Status: He is alert.  Psychiatric:        Mood and Affect: Mood normal.      ED Course/ Medical Decision Making/ A&P    Procedures .Marland KitchenIncision and Drainage  Date/Time: 09/06/2022 3:17 PM  Performed by: Glyn Ade, MD Authorized by: Glyn Ade, MD   Consent:    Consent obtained:  Verbal   Consent given by:  Patient   Risks, benefits, and alternatives were discussed: yes  Risks discussed:  Incomplete drainage, damage to other organs and infection   Alternatives discussed:  No treatment Universal protocol:    Patient identity confirmed:  Verbally with patient Location:    Type:  Abscess Sedation:    Sedation type:  None Anesthesia:    Anesthesia method:  None Procedure type:    Complexity:  Simple Procedure details:    Ultrasound guidance: yes     Needle aspiration: yes     Incision types:  Single with marsupialization   Wound management:  Probed and deloculated   Drainage:  Bloody   Drainage amount:  Copious   Wound treatment:  Wound left open   Packing materials:   None Post-procedure details:    Procedure completion:  Tolerated    Medications Ordered in ED Medications  iohexol (OMNIPAQUE) 300 MG/ML solution 100 mL (100 mLs Intravenous Contrast Given 09/06/22 1103)  lidocaine-EPINEPHrine (XYLOCAINE W/EPI) 2 %-1:200000 (PF) injection 20 mL (20 mLs Infiltration Given 09/06/22 1339)  fentaNYL (SUBLIMAZE) injection 50 mcg (50 mcg Intravenous Given 09/06/22 1338)    Medical Decision Making:    [name] is a 62 y.o. male who presented to the ED today with rectal pain detailed above.     Patient's presentation is complicated by their history of multiple comorbid medical problems.  Patient placed on continuous vitals and telemetry monitoring while in ED which was reviewed periodically.   Complete initial physical exam performed, notably the patient  was hemodynamically stable no acute distress.      Reviewed and confirmed nursing documentation for past medical history, family history, social history.    Initial Assessment:   With the patient's presentation of rectal pain perirectal versus, most likely diagnosis is perianal versus perirectal abscess. Other diagnoses were considered including (but not limited to) cellulitis, folliculitis, fistulous formation, proctitis. These are considered less likely due to history of present illness and physical exam findings.   This is most consistent with an acute life/limb threatening illness complicated by underlying chronic conditions.  Initial Plan:  CT abdomen pelvis with contrast to evaluate for depth of induration, visible drainable fluid collection Screening labs including CBC and Metabolic panel to evaluate for infectious or metabolic etiology of disease.  EKG to evaluate for cardiac pathology. Objective evaluation as below reviewed with plan for close reassessment  Initial Study Results:   Laboratory  All laboratory results reviewed without evidence of clinically relevant pathology.   Exceptions include:  Mild leukocytosis   EKG EKG was reviewed independently. Rate, rhythm, axis, intervals all examined and without medically relevant abnormality. ST segments without concerns for elevations.    Radiology  All images reviewed independently. Agree with radiology report at this time.   CT ABDOMEN PELVIS W CONTRAST  Result Date: 09/06/2022 CLINICAL DATA:  Chronic generalized abdominal pain. EXAM: CT ABDOMEN AND PELVIS WITH CONTRAST TECHNIQUE: Multidetector CT imaging of the abdomen and pelvis was performed using the standard protocol following bolus administration of intravenous contrast. RADIATION DOSE REDUCTION: This exam was performed according to the departmental dose-optimization program which includes automated exposure control, adjustment of the mA and/or kV according to patient size and/or use of iterative reconstruction technique. CONTRAST:  OMNIPAQUE IOHEXOL 300 MG/ML  SOLN COMPARISON:  None Available. FINDINGS: Lower chest: Minimal bibasilar subsegmental atelectasis is noted. Hepatobiliary: No focal liver abnormality is seen. No gallstones, gallbladder wall thickening, or biliary dilatation. Pancreas: Unremarkable. No pancreatic ductal dilatation or surrounding inflammatory changes. Spleen: Normal in size without focal abnormality. Adrenals/Urinary Tract: Adrenal glands are  unremarkable. Kidneys are normal, without renal calculi, focal lesion, or hydronephrosis. Bladder is unremarkable. Stomach/Bowel: Stomach and appendix are unremarkable. There is no evidence of bowel obstruction. 3.1 x 2.9 cm air and fluid collection is seen in the posterior perianal region concerning for abscess which is incompletely visualized. Vascular/Lymphatic: Aortic atherosclerosis. No enlarged abdominal or pelvic lymph nodes. Reproductive: Mild prostatic enlargement is noted. Other: Small fat containing left inguinal hernia. No ascites is noted. Musculoskeletal: No acute or significant osseous findings. IMPRESSION:  Probable 3.1 x 2.9 cm perianal abscess is noted as described above. Mild prostatic enlargement. Small fat containing left inguinal hernia. Aortic Atherosclerosis (ICD10-I70.0). Electronically Signed   By: Lupita Raider M.D.   On: 09/06/2022 12:22      Final Assessment and Plan:   Perianal abscess evaluated with CT abdomen pelvis with contrast.  Incision and drainage was performed with copious drainage. Given slight leukocytosis and surrounding erythema will treat with Augmentin and have patient follow-up with PCP in outpatient setting for reassessment.  Will provide information for general surgery follow-up should patient have interval worsening.  Disposition:  I have considered need for hospitalization, however, considering all of the above, I believe this patient is stable for discharge at this time.  Patient/family educated about specific return precautions for given chief complaint and symptoms.  Patient/family educated about follow-up with PCP and Gen Surg.     Patient/family expressed understanding of return precautions and need for follow-up. Patient spoken to regarding all imaging and laboratory results and appropriate follow up for these results. All education provided in verbal form with additional information in written form. Time was allowed for answering of patient questions. Patient discharged.    Emergency Department Medication Summary:   Medications  iohexol (OMNIPAQUE) 300 MG/ML solution 100 mL (100 mLs Intravenous Contrast Given 09/06/22 1103)  lidocaine-EPINEPHrine (XYLOCAINE W/EPI) 2 %-1:200000 (PF) injection 20 mL (20 mLs Infiltration Given 09/06/22 1339)  fentaNYL (SUBLIMAZE) injection 50 mcg (50 mcg Intravenous Given 09/06/22 1338)     Clinical Impression:  1. Generalized abdominal pain   2. Perianal abscess      Discharge   Final Clinical Impression(s) / ED Diagnoses Final diagnoses:  Generalized abdominal pain  Perianal abscess    Rx / DC Orders ED  Discharge Orders          Ordered    amoxicillin-clavulanate (AUGMENTIN) 875-125 MG tablet  Every 12 hours        09/06/22 1517              Glyn Ade, MD 09/06/22 1519

## 2022-09-06 NOTE — ED Notes (Signed)
Patient transported to CT 

## 2022-09-06 NOTE — ED Triage Notes (Signed)
"  Have been having pain in my abdomen since November, in the day time I am okay, but at night time when I urinate it feels like a fire in my abdomen and down my legs and lower back. Feels like a cat is in the inside of my abdomen scratching me on the inside" per pt  Denies n/v/d

## 2022-09-08 ENCOUNTER — Telehealth: Payer: Self-pay

## 2022-09-08 NOTE — Transitions of Care (Post Inpatient/ED Visit) (Signed)
   09/08/2022  Name: [name] MRN: [id number] DOB: 16-Jul-1960  Today's TOC FU Call Status: Today's TOC FU Call Status:: Unsuccessul Call (1st Attempt) Unsuccessful Call (1st Attempt) Date: 09/08/22  Attempted to reach the patient regarding the most recent Inpatient/ED visit.  Follow Up Plan: Additional outreach attempts will be made to reach the patient to complete the Transitions of Care (Post Inpatient/ED visit) call.   Signature Renelda Loma RMA

## 2022-09-09 ENCOUNTER — Telehealth: Payer: Self-pay

## 2022-09-09 NOTE — Transitions of Care (Post Inpatient/ED Visit) (Signed)
   09/09/2022  Name: [name] MRN: [id number] DOB: 05/15/1960  Today's TOC FU Call Status: Today's TOC FU Call Status:: Unsuccessful Call (2nd Attempt) Unsuccessful Call (2nd Attempt) Date: 09/09/22  Attempted to reach the patient regarding the most recent Inpatient/ED visit.  Follow Up Plan: Additional outreach attempts will be made to reach the patient to complete the Transitions of Care (Post Inpatient/ED visit) call.   Signature Renelda Loma RMA

## 2022-09-13 ENCOUNTER — Telehealth: Payer: Self-pay | Admitting: Nurse Practitioner

## 2022-09-13 NOTE — Telephone Encounter (Signed)
Caller & Relationship to patient:  MRN #  [id number]   Call Back Number:   Date of Last Office Visit: 09/09/2022     Date of Next Office Visit: 09/14/2022    Medication(s) to be Refilled: Metformin and Cholesterol med to be refilled  Preferred Pharmacy:   ** Please notify patient to allow 48-72 hours to process** **Let patient know to contact pharmacy at the end of the day to make sure medication is ready. ** **If patient has not been seen in a year or longer, book an appointment **Advise to use MyChart for refill requests OR to contact their pharmacy

## 2022-09-14 ENCOUNTER — Encounter: Payer: Self-pay | Admitting: Nurse Practitioner

## 2022-09-14 ENCOUNTER — Ambulatory Visit (INDEPENDENT_AMBULATORY_CARE_PROVIDER_SITE_OTHER): Payer: Self-pay | Admitting: Nurse Practitioner

## 2022-09-14 ENCOUNTER — Other Ambulatory Visit: Payer: Self-pay

## 2022-09-14 VITALS — BP 103/60 | HR 73 | Temp 97.1°F | Wt 147.8 lb

## 2022-09-14 DIAGNOSIS — E1165 Type 2 diabetes mellitus with hyperglycemia: Secondary | ICD-10-CM

## 2022-09-14 DIAGNOSIS — E782 Mixed hyperlipidemia: Secondary | ICD-10-CM

## 2022-09-14 DIAGNOSIS — M25562 Pain in left knee: Secondary | ICD-10-CM | POA: Insufficient documentation

## 2022-09-14 DIAGNOSIS — K610 Anal abscess: Secondary | ICD-10-CM

## 2022-09-14 DIAGNOSIS — Z09 Encounter for follow-up examination after completed treatment for conditions other than malignant neoplasm: Secondary | ICD-10-CM

## 2022-09-14 DIAGNOSIS — G8929 Other chronic pain: Secondary | ICD-10-CM

## 2022-09-14 DIAGNOSIS — M4802 Spinal stenosis, cervical region: Secondary | ICD-10-CM

## 2022-09-14 DIAGNOSIS — K629 Disease of anus and rectum, unspecified: Secondary | ICD-10-CM | POA: Insufficient documentation

## 2022-09-14 DIAGNOSIS — I700 Atherosclerosis of aorta: Secondary | ICD-10-CM

## 2022-09-14 DIAGNOSIS — Z716 Tobacco abuse counseling: Secondary | ICD-10-CM

## 2022-09-14 DIAGNOSIS — M25561 Pain in right knee: Secondary | ICD-10-CM

## 2022-09-14 MED ORDER — METFORMIN HCL 1000 MG PO TABS
1000.0000 mg | ORAL_TABLET | Freq: Two times a day (BID) | ORAL | 0 refills | Status: DC
Start: 2022-09-14 — End: 2022-10-18

## 2022-09-14 MED ORDER — GABAPENTIN 300 MG PO CAPS
300.0000 mg | ORAL_CAPSULE | Freq: Three times a day (TID) | ORAL | 3 refills | Status: AC
Start: 2022-09-14 — End: 2022-12-13

## 2022-09-14 MED ORDER — ATORVASTATIN CALCIUM 20 MG PO TABS
20.0000 mg | ORAL_TABLET | Freq: Every day | ORAL | 0 refills | Status: DC
Start: 2022-09-14 — End: 2022-09-14

## 2022-09-14 MED ORDER — ATORVASTATIN CALCIUM 20 MG PO TABS
20.0000 mg | ORAL_TABLET | Freq: Every day | ORAL | 0 refills | Status: DC
Start: 2022-09-14 — End: 2022-10-18

## 2022-09-14 NOTE — Assessment & Plan Note (Signed)
He continues to smoke cigarettes Smoking cessation encouraged

## 2022-09-14 NOTE — Assessment & Plan Note (Signed)
Had normal ABI He has not been taking gabapentin and ibuprofen that was ordered Patient encouraged to take the medications as ordered Referral sent to the clinical pharmacist to assist with cost of medication and adherence

## 2022-09-14 NOTE — Assessment & Plan Note (Addendum)
Lab Results  Component Value Date   HGBA1C 7.1 (A) 05/30/2022  A1c 7.3 today Patient not taking metformin that was ordered Need to take medication as ordered discussed Referral sent to the clinical pharmacist to assist with cost of medication and adherence Avoid sugar sweets soda Follow-up in 4 months

## 2022-09-14 NOTE — Patient Instructions (Signed)

## 2022-09-14 NOTE — Assessment & Plan Note (Signed)
Status post I&D and treatment with Augmentin  now resolved affected site completely healed, no tenderness or swelling on examination.

## 2022-09-14 NOTE — Assessment & Plan Note (Signed)
Has atorvastatin ordered but I doubt his compliance Patient referred today clinical pharmacist Need to take medication daily as ordered discussed Smoking cessation encouraged

## 2022-09-14 NOTE — Progress Notes (Addendum)
Acute Office Visit  Subjective:     Patient ID: [name], male    DOB: [birth year], 62 y.o.   MRN: [id number]  Chief Complaint  Patient presents with   foot and leg pain    Warming or heat all over right leg starts at stomach all the way down the right leg   Hospitalization Follow-up    Bump or boil on bottom    HPI [name]  has a past medical history of Arthritis of left knee, Cervical stenosis of spine, Diabetes mellitus without complication (HCC), Hyperlipidemia LDL goal <70, and Tobacco use.  Patient presents for hospital follow-up for perianal abscess.  Patient was at the emergency room on 09/06/2022 for complaints of rectal pain.  I&D was performed at the hospital, patient was discharged home on Augmentin.  Patient currently denies rectal pain, fever, chills.    He continues to have chronic right leg pain, has gabapentin, ibuprofen ordered but I doubt his medication compliance.  I doubt his medication compliance, stated that he has not been able to afford his medications due to cost.  Need to take medications as ordered discussed.  Referral sent to the clinical pharmacist for assistance.    Patient is in today for   Review of Systems  Constitutional:  Negative for activity change, appetite change, chills, diaphoresis, fatigue, fever and unexpected weight change.  HENT:  Negative for congestion, dental problem, drooling and ear discharge.   Eyes:  Negative for pain, discharge, redness and itching.  Respiratory:  Negative for apnea, cough, choking, chest tightness, shortness of breath and wheezing.   Cardiovascular: Negative.  Negative for chest pain, palpitations and leg swelling.  Gastrointestinal:  Negative for abdominal distention, abdominal pain, anal bleeding, blood in stool, constipation, diarrhea and vomiting.  Endocrine: Negative for polydipsia, polyphagia and polyuria.  Genitourinary:  Negative for difficulty urinating, flank pain, frequency and genital sores.   Musculoskeletal:  Positive for arthralgias and gait problem. Negative for back pain and joint swelling.  Skin:  Negative for color change, pallor and rash.  Neurological:  Negative for dizziness, facial asymmetry, light-headedness, numbness and headaches.  Psychiatric/Behavioral:  Negative for agitation, behavioral problems, confusion, hallucinations, self-injury, sleep disturbance and suicidal ideas.         Objective:    BP 103/60   Pulse 73   Temp (!) 97.1 F (36.2 C)   Wt 147 lb 12.8 oz (67 kg)   SpO2 100%   BMI 26.18 kg/m    Physical Exam Vitals and nursing note reviewed. Exam conducted with a chaperone present.  Constitutional:      General: He is not in acute distress.    Appearance: Normal appearance. He is not ill-appearing, toxic-appearing or diaphoretic.  HENT:     Mouth/Throat:     Mouth: Mucous membranes are moist.     Pharynx: Oropharynx is clear. No oropharyngeal exudate or posterior oropharyngeal erythema.  Eyes:     General: No scleral icterus.       Right eye: No discharge.        Left eye: No discharge.     Extraocular Movements: Extraocular movements intact.     Conjunctiva/sclera: Conjunctivae normal.  Cardiovascular:     Rate and Rhythm: Normal rate and regular rhythm.     Pulses: Normal pulses.     Heart sounds: Normal heart sounds. No murmur heard.    No friction rub. No gallop.  Pulmonary:     Effort: Pulmonary effort is normal. No respiratory  distress.     Breath sounds: Normal breath sounds. No stridor. No wheezing, rhonchi or rales.  Chest:     Chest wall: No tenderness.  Abdominal:     General: There is no distension.     Palpations: Abdomen is soft.     Tenderness: There is no abdominal tenderness. There is no right CVA tenderness, left CVA tenderness or guarding.  Genitourinary:    Comments: Affected site in the perianal area completely healed, no redness, swelling, drainage, tenderness noted. Musculoskeletal:        General: No  swelling.     Right lower leg: No edema.     Comments: Using a cane for ambulation  Skin:    General: Skin is warm and dry.     Capillary Refill: Capillary refill takes less than 2 seconds.     Coloration: Skin is not jaundiced or pale.     Findings: No bruising, erythema or lesion.  Neurological:     Mental Status: He is alert and oriented to person, place, and time.     Motor: No weakness.     Coordination: Coordination normal.  Psychiatric:        Mood and Affect: Mood normal.        Behavior: Behavior normal.        Thought Content: Thought content normal.        Judgment: Judgment normal.     No results found for any visits on 09/14/22.      Assessment & Plan:   Problem List Items Addressed This Visit       Cardiovascular and Mediastinum   Aortic atherosclerosis (HCC)    Has atorvastatin ordered but I doubt his compliance Patient referred today clinical pharmacist Need to take medication daily as ordered discussed Smoking cessation encouraged      Relevant Medications   atorvastatin (LIPITOR) 20 MG tablet     Endocrine   Type 2 diabetes mellitus with hyperglycemia (HCC) - Primary    Lab Results  Component Value Date   HGBA1C 7.1 (A) 05/30/2022  A1c 7.3 today Patient not taking metformin that was ordered Need to take medication as ordered discussed Referral sent to the clinical pharmacist to assist with cost of medication and adherence Avoid sugar sweets soda Follow-up in 4 months      Relevant Medications   atorvastatin (LIPITOR) 20 MG tablet   Other Relevant Orders   AMB Referral to Pharmacy Medication Management     Other   Chronic pain of right knee   Relevant Medications   gabapentin (NEURONTIN) 300 MG capsule   Other Relevant Orders   AMB Referral to Pharmacy Medication Management   Cervical stenosis of spine   Relevant Medications   gabapentin (NEURONTIN) 300 MG capsule   Other Relevant Orders   AMB Referral to Pharmacy Medication  Management   Mixed hyperlipidemia    Has atorvastatin 20 mg daily ordered but I doubt his compliance Patient referred today clinical pharmacist Need to take medication daily as ordered discussed Follow-up in 4 months      Relevant Medications   atorvastatin (LIPITOR) 20 MG tablet   Other Relevant Orders   AMB Referral to Pharmacy Medication Management   Tobacco abuse counseling    He continues to smoke cigarettes Smoking cessation encouraged      Perianal abscess    Status post I&D and treatment with Augmentin  now resolved affected site completely healed, no tenderness or swelling on examination.  Arthralgia of both lower legs    Had normal ABI He has not been taking gabapentin and ibuprofen that was ordered Patient encouraged to take the medications as ordered Referral sent to the clinical pharmacist to assist with cost of medication and adherence      Encounter for examination following treatment at hospital    Emergency room after visit summary, labs, and imaging studies reviewed. Abscess now resolved       Meds ordered this encounter  Medications   atorvastatin (LIPITOR) 20 MG tablet    Sig: Take 1 tablet (20 mg total) by mouth daily.    Dispense:  120 tablet    Refill:  0   gabapentin (NEURONTIN) 300 MG capsule    Sig: Take 1 capsule (300 mg total) by mouth 3 (three) times daily.    Dispense:  90 capsule    Refill:  3    Return in about 3 months (around 12/15/2022) for DM, HYPERLIPIDEMIA.  Donell Beers, FNP

## 2022-09-14 NOTE — Telephone Encounter (Signed)
Done KH 

## 2022-09-14 NOTE — Assessment & Plan Note (Signed)
Emergency room after visit summary, labs, and imaging studies reviewed. Abscess now resolved

## 2022-09-14 NOTE — Assessment & Plan Note (Addendum)
Has atorvastatin 20 mg daily ordered but I doubt his compliance Patient referred today clinical pharmacist Need to take medication daily as ordered discussed Follow-up in 4 months

## 2022-09-15 ENCOUNTER — Telehealth: Payer: Self-pay

## 2022-09-15 NOTE — Progress Notes (Signed)
   Care Guide Note  09/15/2022 Name: [name] MRN: [id number] DOB: Feb 02, 1961  Referred by: Donell Beers, FNP Reason for referral : Care Coordination (Outreach to schedule with Pharm d )   [name] is a 62 y.o. year old male who is a primary care patient of Donell Beers, FNP. Tal Kempker was referred to the pharmacist for assistance related to DM.    Successful contact was made with the patient to discuss pharmacy services including being ready for the pharmacist to call at least 5 minutes before the scheduled appointment time, to have medication bottles and any blood sugar or blood pressure readings ready for review. The patient agreed to meet with the pharmacist via with the pharmacist via telephone visit on (date/time).  10/18/2022  Penne Lash, RMA Care Guide [facility]  [place], [place] [address] Direct Dial: [phone] [email or web address]

## 2022-09-26 ENCOUNTER — Ambulatory Visit: Payer: Self-pay | Admitting: Nurse Practitioner

## 2022-10-03 ENCOUNTER — Ambulatory Visit: Payer: Self-pay | Admitting: Nurse Practitioner

## 2022-10-14 ENCOUNTER — Telehealth: Payer: Self-pay | Admitting: Nurse Practitioner

## 2022-10-14 NOTE — Telephone Encounter (Signed)
Pt called stating he was suppose to get his medications in the mail but he has not yet.  Medication for his diabetes, cholorestoral, and something else he couldn't remember

## 2022-10-18 ENCOUNTER — Other Ambulatory Visit (HOSPITAL_COMMUNITY): Payer: Self-pay

## 2022-10-18 ENCOUNTER — Other Ambulatory Visit: Payer: Self-pay | Admitting: Pharmacist

## 2022-10-18 DIAGNOSIS — E1165 Type 2 diabetes mellitus with hyperglycemia: Secondary | ICD-10-CM

## 2022-10-18 DIAGNOSIS — E782 Mixed hyperlipidemia: Secondary | ICD-10-CM

## 2022-10-18 MED ORDER — LISINOPRIL 2.5 MG PO TABS
2.5000 mg | ORAL_TABLET | Freq: Every day | ORAL | 2 refills | Status: DC
Start: 2022-10-18 — End: 2023-03-24
  Filled 2022-10-18: qty 30, 30d supply, fill #0
  Filled 2022-12-06: qty 30, 30d supply, fill #1
  Filled 2023-02-10: qty 30, 30d supply, fill #2

## 2022-10-18 MED ORDER — ATORVASTATIN CALCIUM 20 MG PO TABS
20.0000 mg | ORAL_TABLET | Freq: Every day | ORAL | 2 refills | Status: AC
Start: 2022-10-18 — End: 2023-10-18
  Filled 2022-10-18: qty 30, 30d supply, fill #0
  Filled 2022-12-06: qty 30, 30d supply, fill #1

## 2022-10-18 MED ORDER — METFORMIN HCL 1000 MG PO TABS
1000.0000 mg | ORAL_TABLET | Freq: Two times a day (BID) | ORAL | 2 refills | Status: DC
Start: 2022-10-18 — End: 2023-04-11
  Filled 2022-10-18: qty 60, 30d supply, fill #0
  Filled 2022-12-06: qty 60, 30d supply, fill #1
  Filled 2023-02-10: qty 60, 30d supply, fill #2

## 2022-10-18 NOTE — Progress Notes (Signed)
10/18/2022 Name: [name] MRN: [id number] DOB: 1960/08/02  Chief Complaint  Patient presents with   Medication Management   Diabetes   Hypertension   Hyperlipidemia    [name] is a 62 y.o. year old male who presented for a telephone visit.   They were referred to the pharmacist by their PCP for assistance in managing diabetes, hypertension, and hyperlipidemia.    Subjective:  Care Team: Primary Care Provider: Donell Beers, FNP ; Next Scheduled Visit:   Medication Access/Adherence  Current Pharmacy:  [facility][id number] [place], [place] - [phone] AT [facility] OF [facility] & [address] [place] [address] Phone: [phone] Fax: [phone]   Patient reports affordability concerns with their medications: Yes  Patient reports access/transportation concerns to their pharmacy: Yes  Patient reports adherence concerns with their medications:  Yes    Patient notes that he has not received any of his medications. Appears medications were sent to [facility], not to a pharmacy that delivers.   Diabetes:  Current medications: prescribed metformin 1000 mg twice daily, but does not have  Hypertension:  Current medications: prescribed lisinopril 2.5 mg daily, but does not have   Hyperlipidemia/ASCVD Risk Reduction  Current lipid lowering medications: prescribed atorvastatin 20 mg daily, but does not have e    Objective:  Lab Results  Component Value Date   HGBA1C 7.1 (A) 05/30/2022    Lab Results  Component Value Date   CREATININE 0.60 (L) 09/06/2022   BUN 17 09/06/2022   NA 137 09/06/2022   K 3.7 09/06/2022   CL 104 09/06/2022   CO2 26 09/06/2022    Lab Results  Component Value Date   CHOL 179 05/30/2022   HDL 34 (L) 05/30/2022   LDLCALC 125 (H) 05/30/2022   TRIG 111 05/30/2022   CHOLHDL 5.3 (H) 05/30/2022    Medications Reviewed Today     Reviewed by Alden Hipp, RPH-CPP (Pharmacist) on  10/18/22 at [phone]  Med List Status: <None>   Medication Order Taking? Sig Documenting Provider Last Dose Status Informant  aspirin EC 81 MG tablet [id number] No Take 81 mg by mouth daily.  Patient not taking: Reported on 10/18/2022   [provider] Not Taking Active Multiple Informants  atorvastatin (LIPITOR) 20 MG tablet [id number] No Take 1 tablet (20 mg total) by mouth daily.  Patient not taking: Reported on 10/18/2022   Donell Beers, FNP Not Taking Active   cyanocobalamin (VITAMIN B12) 500 MCG tablet [id number]  Take 1 tablet (500 mcg total) by mouth daily.  Patient not taking: Reported on 07/26/2022   Donell Beers, FNP  Active   gabapentin (NEURONTIN) 300 MG capsule [id number]  Take 1 capsule (300 mg total) by mouth 3 (three) times daily. Donell Beers, FNP  Active   ibuprofen (ADVIL) 600 MG tablet [id number]  Take 1 tablet (600 mg total) by mouth every 8 (eight) hours as needed.  Patient not taking: Reported on 09/14/2022   Donell Beers, FNP  Active   lisinopril (ZESTRIL) 2.5 MG tablet [id number] No Take 1 tablet (2.5 mg total) by mouth daily.  Patient not taking: Reported on 10/18/2022   Donell Beers, FNP Not Taking Active   metFORMIN (GLUCOPHAGE) 1000 MG tablet [id number] No Take 1 tablet (1,000 mg total) by mouth 2 (two) times daily with a meal.  Patient not taking: Reported on 10/18/2022   Donell Beers, FNP Not Taking Active  ondansetron (ZOFRAN) 4 MG tablet [id number]  Take 1 tablet (4 mg total) by mouth every 6 (six) hours.  Patient not taking: Reported on 02/07/2022   Junious Silk, PA-C  Active   polyethylene glycol powder (GLYCOLAX/MIRALAX) 17 GM/SCOOP powder [id number]  Take 17 g by mouth daily as needed.  Patient not taking: Reported on 05/30/2022   Donell Beers, FNP  Active               Assessment/Plan:   Diabetes: - Currently uncontrolled due to medication access - Discussed cash prices at [facility]. Patient aware  of location and can pick up. Will collaborate with provider to resend scripts.  Hypertension: - Currently uncontrolled due to medication access - Discussed cash prices at [facility]. Patient aware of location and can pick up. Will collaborate with provider to resend scripts.  Hyperlipidemia/ASCVD Risk Reduction: - Currently uncontrolled due to medication access - Discussed cash prices at [facility]. Patient aware of location and can pick up. Will collaborate with provider to resend scripts.   Follow Up Plan: phone call in 4 weeks  Catie TClearance Coots, PharmD, BCACP, CPP Clinical Pharmacist [facility] [phone]

## 2022-11-28 ENCOUNTER — Other Ambulatory Visit: Payer: Self-pay | Admitting: Pharmacist

## 2022-11-29 ENCOUNTER — Other Ambulatory Visit: Payer: Self-pay | Admitting: Pharmacist

## 2022-11-29 NOTE — Progress Notes (Signed)
Pt was called but he could not hear me will try again tomorrow. Kh

## 2022-11-29 NOTE — Progress Notes (Signed)
 I have reviewed the pharmacist's encounter and agree with their documentation.   Catie Eppie Gibson, PharmD, BCACP, CPP [facility] [phone]

## 2022-11-29 NOTE — Progress Notes (Signed)
11/29/2022 Name: [name] MRN: [id number] DOB: 09/30/1960  [name] is a 62 y.o. year old male who presented for a telephone visit using a Spanish interpreter. PMH includes arthritis of left knee, arthralgia of both legs, cervical stenosis of spine, diabetes mellitus, hyperlipidemia LDL goal <70, and tobacco use.    They were referred to the pharmacist by their PCP for assistance in managing diabetes, hypertension, hyperlipidemia, and medication access.   Subjective:  Care Team: Primary Care Provider: Donell Beers, FNP; Next Scheduled Visit: 12/16/2022  Medication Access/Adherence  Current Pharmacy:  [facility] #[id number] [place], [place] - [address] AT [facility] OF [facility] & [address] [place] [address] Phone: [phone] Fax: [phone]  [facility] - [facility] [address] [place] [address] Phone: [phone] Fax: [phone]   Patient reports affordability concerns with their medications: Yes - able to pick up prescriptions at this time, however, patient is experience lower extremity and arm pain making it difficult for him to work. Pt has concerns about cost of living with health issues. Will discuss this with Child psychotherapist onsite.  Patient reports access/transportation concerns to their pharmacy: Yes - typically has prescriptions delivered Patient reports adherence concerns with their medications:  Yes     Diabetes: Current medications: metformin 1000 mg twice daily   Patient denies hypoglycemic s/sx including dizziness, shakiness, sweating. Patient denies hyperglycemic symptoms including polyuria, polydipsia, polyphagia, nocturia, neuropathy, blurred vision.  Hypertension: Current medications: lisinopril 2.5 mg daily    Patient does not have a validated, automated, upper arm home BP cuff  Patient denies hypotensive s/sx including dizziness, lightheadedness.  Patient denies  hypertensive symptoms including headache, chest pain, shortness of breath   Hyperlipidemia/ASCVD Risk Reduction Current lipid lowering medications: atorvastatin 20 mg daily   ASCVD History: none Family History: diabetes (father) Risk Factors: tobacco use, DM, HLD  Objective:  Lab Results  Component Value Date   HGBA1C 7.1 (A) 05/30/2022    Lab Results  Component Value Date   CREATININE 0.60 (L) 09/06/2022   BUN 17 09/06/2022   NA 137 09/06/2022   K 3.7 09/06/2022   CL 104 09/06/2022   CO2 26 09/06/2022    Lab Results  Component Value Date   CHOL 179 05/30/2022   HDL 34 (L) 05/30/2022   LDLCALC 125 (H) 05/30/2022   TRIG 111 05/30/2022   CHOLHDL 5.3 (H) 05/30/2022    Medications Reviewed Today   Medications were not reviewed in this encounter     Assessment/Plan:   Diabetes: - Currently uncontrolled most likely due to medication access. Patient was able to pick up medications recently on 10/20/2022.  - Last A1c is 7.1% on 05/30/2022 (goal <7%) - Reviewed long term cardiovascular and renal outcomes of uncontrolled blood sugar - Reviewed goal A1c, goal fasting, and goal 2 hour post prandial glucose - Recommend to continue metformin 1,000 mg twice daily    - Recommend rechecking A1c at next PCP appointment   Hypertension: - Currently uncontrolled most likely due to medication access. Patient was able to pick up medications recently on 10/20/2022.  - Reviewed long term cardiovascular and renal outcomes of uncontrolled blood pressure - Reviewed appropriate blood pressure monitoring technique and reviewed goal blood pressure.  - Recommend to continue lisinopril 2.5 mg daily   - Will provide patient with a BP cuff at next PCP visit    Hyperlipidemia/ASCVD Risk Reduction: - Currently uncontrolled most  likely due to medication access. Patient was able to pick up medications recently on 10/20/2022.  - LDL from 6 months ago was 657 (goal <70) - Patient endorses experiencing  increased leg and arm pain since resuming statin. - Recommend to HOLD atorvastatin - Can consider switching statins or starting atorvastatin at lower dose if patient feels like holding the statin has improved his pain at next follow-up appointment.  - Recommend rechecking lipid panel at next PCP appointment    Patient also asked about Cologuard test. Mentioned to follow-up with PCP at next appointment and will mention to PCP.   Follow Up Plan: phone call with pharmacist on 01/17/2023   Roslyn Smiling, PharmD PGY1 Pharmacy Resident 11/29/2022 2:56 PM

## 2022-12-05 NOTE — Progress Notes (Signed)
Pt kept answer but could not hear me . KH

## 2022-12-06 ENCOUNTER — Other Ambulatory Visit (HOSPITAL_COMMUNITY): Payer: Self-pay

## 2022-12-16 ENCOUNTER — Other Ambulatory Visit (HOSPITAL_COMMUNITY): Payer: Self-pay

## 2022-12-16 ENCOUNTER — Ambulatory Visit (INDEPENDENT_AMBULATORY_CARE_PROVIDER_SITE_OTHER): Payer: Self-pay | Admitting: Nurse Practitioner

## 2022-12-16 ENCOUNTER — Encounter: Payer: Self-pay | Admitting: Nurse Practitioner

## 2022-12-16 VITALS — BP 107/68 | HR 75 | Wt 150.6 lb

## 2022-12-16 DIAGNOSIS — Z1159 Encounter for screening for other viral diseases: Secondary | ICD-10-CM

## 2022-12-16 DIAGNOSIS — I10 Essential (primary) hypertension: Secondary | ICD-10-CM

## 2022-12-16 DIAGNOSIS — E782 Mixed hyperlipidemia: Secondary | ICD-10-CM

## 2022-12-16 DIAGNOSIS — E1165 Type 2 diabetes mellitus with hyperglycemia: Secondary | ICD-10-CM

## 2022-12-16 DIAGNOSIS — Z716 Tobacco abuse counseling: Secondary | ICD-10-CM

## 2022-12-16 DIAGNOSIS — Z114 Encounter for screening for human immunodeficiency virus [HIV]: Secondary | ICD-10-CM

## 2022-12-16 DIAGNOSIS — M25561 Pain in right knee: Secondary | ICD-10-CM

## 2022-12-16 DIAGNOSIS — Z125 Encounter for screening for malignant neoplasm of prostate: Secondary | ICD-10-CM

## 2022-12-16 DIAGNOSIS — E785 Hyperlipidemia, unspecified: Secondary | ICD-10-CM

## 2022-12-16 DIAGNOSIS — G8929 Other chronic pain: Secondary | ICD-10-CM

## 2022-12-16 DIAGNOSIS — R8290 Unspecified abnormal findings in urine: Secondary | ICD-10-CM | POA: Insufficient documentation

## 2022-12-16 DIAGNOSIS — M4802 Spinal stenosis, cervical region: Secondary | ICD-10-CM

## 2022-12-16 LAB — POCT GLYCOSYLATED HEMOGLOBIN (HGB A1C): Hemoglobin A1C: 6.8 % — AB (ref 4.0–5.6)

## 2022-12-16 LAB — POCT URINALYSIS DIP (CLINITEK)
Bilirubin, UA: NEGATIVE
Blood, UA: NEGATIVE
Glucose, UA: NEGATIVE mg/dL
Ketones, POC UA: NEGATIVE mg/dL
Leukocytes, UA: NEGATIVE
Nitrite, UA: NEGATIVE
POC PROTEIN,UA: NEGATIVE
Spec Grav, UA: 1.025 (ref 1.010–1.025)
Urobilinogen, UA: 1 U/dL
pH, UA: 7 (ref 5.0–8.0)

## 2022-12-16 MED ORDER — GABAPENTIN 300 MG PO CAPS
300.0000 mg | ORAL_CAPSULE | Freq: Three times a day (TID) | ORAL | 3 refills | Status: DC
Start: 2022-12-16 — End: 2023-03-24
  Filled 2022-12-16: qty 90, 30d supply, fill #0
  Filled 2023-02-10: qty 90, 30d supply, fill #1

## 2022-12-16 MED ORDER — IBUPROFEN 600 MG PO TABS
600.0000 mg | ORAL_TABLET | Freq: Three times a day (TID) | ORAL | 1 refills | Status: DC | PRN
Start: 2022-12-16 — End: 2023-03-24
  Filled 2022-12-16: qty 30, 10d supply, fill #0
  Filled 2023-02-10: qty 30, 10d supply, fill #1

## 2022-12-16 NOTE — Assessment & Plan Note (Signed)
Lab Results  Component Value Date   HGBA1C 6.8 (A) 12/16/2022  Continue metformin 1000 mg twice daily Patient counseled on low-carb modified diet Encouraged to engage in regular moderate exercises at least 150 minutes weekly as tolerated Follow-up in 3 months

## 2022-12-16 NOTE — Assessment & Plan Note (Signed)
BP Readings from Last 3 Encounters:  12/16/22 107/68  09/14/22 103/60  09/06/22 110/68   HTN Controlled on lisinopril 2.5 mg daily Continue current medications. No changes in management. Discussed DASH diet and dietary sodium restrictions Continue to increase dietary efforts and exercise.  CMP today

## 2022-12-16 NOTE — Assessment & Plan Note (Signed)
States that he smokes 1 cigarette daily Smoking cessation encouraged

## 2022-12-16 NOTE — Assessment & Plan Note (Signed)
Lab Results  Component Value Date   CHOL 179 05/30/2022   HDL 34 (L) 05/30/2022   LDLCALC 125 (H) 05/30/2022   TRIG 111 05/30/2022   CHOLHDL 5.3 (H) 05/30/2022  Currently on atorvastatin 20 mg daily Goal is LDL less than 70 Checking lipid panel

## 2022-12-16 NOTE — Assessment & Plan Note (Signed)
-   gabapentin (NEURONTIN) 300 MG capsule; Take 1 capsule (300 mg total) by mouth 3 (three) times daily.  Dispense: 90 capsule; Refill: 3 - ibuprofen (ADVIL) 600 MG tablet; Take 1 tablet (600 mg total) by mouth every 8 (eight) hours as needed.  Dispense: 30 tablet; Refill: 1 Application of heat or ice encouraged

## 2022-12-16 NOTE — Patient Instructions (Signed)
.   Cervical stenosis of spine  - gabapentin (NEURONTIN) 300 MG capsule; Take 1 capsule (300 mg total) by mouth 3 (three) times daily.  Dispense: 90 capsule; Refill: 3   Chronic pain of right knee  - gabapentin (NEURONTIN) 300 MG capsule; Take 1 capsule (300 mg total) by mouth 3 (three) times daily.  Dispense: 90 capsule; Refill: 3 - ibuprofen (ADVIL) 600 MG tablet; Take 1 tablet (600 mg total) by mouth every 8 (eight) hours as needed.  Dispense: 30 tablet; Refill: 1  It is important that you exercise regularly at least 30 minutes 5 times a week as tolerated  Think about what you will eat, plan ahead. Choose " clean, green, fresh or frozen" over canned, processed or packaged foods which are more sugary, salty and fatty. 70 to 75% of food eaten should be vegetables and fruit. Three meals at set times with snacks allowed between meals, but they must be fruit or vegetables. Aim to eat over a 12 hour period , example 7 am to 7 pm, and STOP after  your last meal of the day. Drink water,generally about 64 ounces per day, no other drink is as healthy. Fruit juice is best enjoyed in a healthy way, by EATING the fruit.  Thanks for choosing [facility] we consider it a privelige to serve you.

## 2022-12-16 NOTE — Assessment & Plan Note (Signed)
Urine is negative for UTI

## 2022-12-16 NOTE — Progress Notes (Addendum)
Established Patient Office Visit  Subjective:  Patient ID: [name], male    DOB: [birth year]  Age: 62 y.o. MRN: [id number]  CC:  Chief Complaint  Patient presents with  . Diabetes    Follow up    HPI [name] is a 62 y.o. male  has a past medical history of Arthritis of left knee, Cervical stenosis of spine, Diabetes mellitus without complication (HCC), Hyperlipidemia LDL goal <70, Primary hypertension (02/07/2022), and Tobacco use.   Patient continues  to have chronic bilateral leg pain, has been taking OTC ibuprofen as needed, not taking gabapentin.  Patient complains of urinary odor.  He denies fever, chills, hesitancy, dysuria, blood in the urine  Please see assessment and plan section for full HPI    Past Medical History:  Diagnosis Date  . Arthritis of left knee   . Cervical stenosis of spine   . Diabetes mellitus without complication (HCC)   . Hyperlipidemia LDL goal <70   . Primary hypertension 02/07/2022  . Tobacco use     History reviewed. No pertinent surgical history.  Family History  Problem Relation Age of Onset  . Diabetes Father   . Colon cancer Neg Hx   . Prostate cancer Neg Hx     Social History   Socioeconomic History  . Marital status: Single    Spouse name: Not on file  . Number of children: 9  . Years of education: Not on file  . Highest education level: Not on file  Occupational History  . Not on file  Tobacco Use  . Smoking status: Every Day    Current packs/day: 0.25    Types: Cigarettes  . Smokeless tobacco: Current  Substance and Sexual Activity  . Alcohol use: Yes    Alcohol/week: 2.0 standard drinks of alcohol    Types: 2 Cans of beer per week    Comment: occas  . Drug use: Not Currently    Comment: ued to smoke weed  . Sexual activity: Not on file  Other Topics Concern  . Not on file  Social History Narrative   Lives alone with a room mate .    Social Determinants of Health   Financial Resource Strain: Not on  file  Food Insecurity: Not on file  Transportation Needs: Not on file  Physical Activity: Not on file  Stress: Not on file  Social Connections: Not on file  Intimate Partner Violence: Not on file    Outpatient Medications Prior to Visit  Medication Sig Dispense Refill  . aspirin EC 81 MG tablet Take 81 mg by mouth daily.    Marland Kitchen atorvastatin (LIPITOR) 20 MG tablet Take 1 tablet (20 mg total) by mouth daily. 30 tablet 2  . lisinopril (ZESTRIL) 2.5 MG tablet Take 1 tablet (2.5 mg total) by mouth daily. 30 tablet 2  . metFORMIN (GLUCOPHAGE) 1000 MG tablet Take 1 tablet (1,000 mg total) by mouth 2 (two) times daily with a meal. 120 tablet 2  . cyanocobalamin (VITAMIN B12) 500 MCG tablet Take 1 tablet (500 mcg total) by mouth daily. (Patient not taking: Reported on 07/26/2022) 90 tablet 0  . polyethylene glycol powder (GLYCOLAX/MIRALAX) 17 GM/SCOOP powder Take 17 g by mouth daily as needed. (Patient not taking: Reported on 05/30/2022) 3350 g 2  . gabapentin (NEURONTIN) 300 MG capsule Take 1 capsule (300 mg total) by mouth 3 (three) times daily. 90 capsule 3  . ibuprofen (ADVIL) 600 MG tablet Take 1 tablet (600 mg total) by  mouth every 8 (eight) hours as needed. (Patient not taking: Reported on 09/14/2022) 30 tablet 1  . ondansetron (ZOFRAN) 4 MG tablet Take 1 tablet (4 mg total) by mouth every 6 (six) hours. (Patient not taking: Reported on 02/07/2022) 12 tablet 0   No facility-administered medications prior to visit.    No Known Allergies  ROS Review of Systems  Constitutional:  Negative for activity change, appetite change, chills, diaphoresis, fatigue, fever and unexpected weight change.  HENT:  Negative for congestion, dental problem, drooling and ear discharge.   Eyes:  Negative for pain, discharge, redness and itching.  Respiratory:  Negative for apnea, cough, choking, chest tightness, shortness of breath and wheezing.   Cardiovascular: Negative.  Negative for chest pain, palpitations and  leg swelling.  Gastrointestinal:  Negative for abdominal distention, abdominal pain, anal bleeding, blood in stool, constipation, diarrhea and vomiting.  Endocrine: Negative for polydipsia, polyphagia and polyuria.  Genitourinary:  Negative for difficulty urinating, flank pain, frequency and genital sores.  Musculoskeletal:  Positive for arthralgias and gait problem. Negative for back pain and joint swelling.  Skin:  Negative for color change, pallor and rash.  Neurological:  Negative for dizziness, facial asymmetry, light-headedness, numbness and headaches.  Psychiatric/Behavioral:  Negative for agitation, behavioral problems, confusion, hallucinations, self-injury, sleep disturbance and suicidal ideas.       Objective:    Physical Exam Vitals and nursing note reviewed.  Constitutional:      General: He is not in acute distress.    Appearance: Normal appearance. He is not ill-appearing, toxic-appearing or diaphoretic.  HENT:     Mouth/Throat:     Mouth: Mucous membranes are moist.     Pharynx: Oropharynx is clear. No oropharyngeal exudate or posterior oropharyngeal erythema.  Eyes:     General: No scleral icterus.       Right eye: No discharge.        Left eye: No discharge.     Extraocular Movements: Extraocular movements intact.     Conjunctiva/sclera: Conjunctivae normal.  Cardiovascular:     Rate and Rhythm: Normal rate and regular rhythm.     Pulses: Normal pulses.     Heart sounds: Normal heart sounds. No murmur heard.    No friction rub. No gallop.  Pulmonary:     Effort: Pulmonary effort is normal. No respiratory distress.     Breath sounds: Normal breath sounds. No stridor. No wheezing, rhonchi or rales.  Chest:     Chest wall: No tenderness.  Abdominal:     General: There is no distension.     Palpations: Abdomen is soft.     Tenderness: There is no abdominal tenderness. There is no right CVA tenderness, left CVA tenderness or guarding.  Musculoskeletal:         General: No swelling or tenderness.     Right lower leg: No edema.     Left lower leg: No edema.     Comments: Using a cane for ambulation  Skin:    General: Skin is warm and dry.     Capillary Refill: Capillary refill takes less than 2 seconds.     Coloration: Skin is not jaundiced or pale.     Findings: No bruising, erythema or lesion.  Neurological:     Mental Status: He is alert and oriented to person, place, and time.     Motor: No weakness.     Coordination: Coordination normal.  Psychiatric:        Mood and Affect:  Mood normal.        Behavior: Behavior normal.        Thought Content: Thought content normal.        Judgment: Judgment normal.    BP 107/68   Pulse 75   Wt 150 lb 9.6 oz (68.3 kg)   SpO2 100%   BMI 26.68 kg/m  Wt Readings from Last 3 Encounters:  12/16/22 150 lb 9.6 oz (68.3 kg)  09/14/22 147 lb 12.8 oz (67 kg)  09/06/22 145 lb (65.8 kg)    Lab Results  Component Value Date   TSH 0.931 05/10/2010   Lab Results  Component Value Date   WBC 10.8 (H) 09/06/2022   HGB 12.6 (L) 09/06/2022   HCT 38.0 (L) 09/06/2022   MCV 94.8 09/06/2022   PLT 169 09/06/2022   Lab Results  Component Value Date   NA 137 09/06/2022   K 3.7 09/06/2022   CO2 26 09/06/2022   GLUCOSE 144 (H) 09/06/2022   BUN 17 09/06/2022   CREATININE 0.60 (L) 09/06/2022   BILITOT 1.7 (H) 09/06/2022   ALKPHOS 72 09/06/2022   AST 15 09/06/2022   ALT 16 09/06/2022   PROT 7.1 09/06/2022   ALBUMIN 3.6 09/06/2022   CALCIUM 8.8 (L) 09/06/2022   ANIONGAP 7 09/06/2022   EGFR 101 03/07/2022   Lab Results  Component Value Date   CHOL 179 05/30/2022   Lab Results  Component Value Date   HDL 34 (L) 05/30/2022   Lab Results  Component Value Date   LDLCALC 125 (H) 05/30/2022   Lab Results  Component Value Date   TRIG 111 05/30/2022   Lab Results  Component Value Date   CHOLHDL 5.3 (H) 05/30/2022   Lab Results  Component Value Date   HGBA1C 6.8 (A) 12/16/2022       Assessment & Plan:   Problem List Items Addressed This Visit       Cardiovascular and Mediastinum   Primary hypertension    BP Readings from Last 3 Encounters:  12/16/22 107/68  09/14/22 103/60  09/06/22 110/68   HTN Controlled on lisinopril 2.5 mg daily Continue current medications. No changes in management. Discussed DASH diet and dietary sodium restrictions Continue to increase dietary efforts and exercise.  CMP today      Relevant Orders   CMP14+EGFR     Endocrine   Type 2 diabetes mellitus with hyperglycemia (HCC)    Lab Results  Component Value Date   HGBA1C 6.8 (A) 12/16/2022  Continue metformin 1000 mg twice daily Patient counseled on low-carb modified diet Encouraged to engage in regular moderate exercises at least 150 minutes weekly as tolerated Follow-up in 3 months        Relevant Orders   POCT glycosylated hemoglobin (Hb A1C) (Completed)     Other   Chronic pain of right knee     - gabapentin (NEURONTIN) 300 MG capsule; Take 1 capsule (300 mg total) by mouth 3 (three) times daily.  Dispense: 90 capsule; Refill: 3 - ibuprofen (ADVIL) 600 MG tablet; Take 1 tablet (600 mg total) by mouth every 8 (eight) hours as needed.  Dispense: 30 tablet; Refill: 1 Application of heat or ice encouraged      Relevant Medications   gabapentin (NEURONTIN) 300 MG capsule   ibuprofen (ADVIL) 600 MG tablet   Cervical stenosis of spine     - gabapentin (NEURONTIN) 300 MG capsule; Take 1 capsule (300 mg total) by mouth 3 (three) times daily.  Dispense:  90 capsule; Refill: 3 - ibuprofen (ADVIL) 600 MG tablet; Take 1 tablet (600 mg total) by mouth every 8 (eight) hours as needed.  Dispense: 30 tablet; Refill: 1 Application of heat or ice encouraged      Relevant Medications   gabapentin (NEURONTIN) 300 MG capsule   Dyslipidemia, goal LDL below 70 - Primary    Lab Results  Component Value Date   CHOL 179 05/30/2022   HDL 34 (L) 05/30/2022   LDLCALC 125 (H) 05/30/2022    TRIG 111 05/30/2022   CHOLHDL 5.3 (H) 05/30/2022  Currently on atorvastatin 20 mg daily Goal is LDL less than 70 Checking lipid panel      Tobacco abuse counseling    States that he smokes 1 cigarette daily Smoking cessation encouraged      Abnormal urine odor    Urine is negative for UTI       Relevant Orders   POCT URINALYSIS DIP (CLINITEK) (Completed)   Other Visit Diagnoses     Screening for prostate cancer       Relevant Orders   PSA   Need for hepatitis C screening test       Relevant Orders   Hepatitis C antibody   Encounter for screening for HIV       Relevant Orders   HIV Antibody (routine testing w rflx)       Meds ordered this encounter  Medications  . gabapentin (NEURONTIN) 300 MG capsule    Sig: Take 1 capsule (300 mg total) by mouth 3 (three) times daily.    Dispense:  90 capsule    Refill:  3  . ibuprofen (ADVIL) 600 MG tablet    Sig: Take 1 tablet (600 mg total) by mouth every 8 (eight) hours as needed.    Dispense:  30 tablet    Refill:  1    Follow-up: Return in about 3 months (around 03/17/2023) for DM, HYPERLIPIDEMIA.    Donell Beers, FNP

## 2022-12-17 LAB — CMP14+EGFR
ALT: 23 [IU]/L (ref 0–44)
AST: 22 [IU]/L (ref 0–40)
Albumin: 4.4 g/dL (ref 3.9–4.9)
Alkaline Phosphatase: 102 [IU]/L (ref 44–121)
BUN/Creatinine Ratio: 21 (ref 10–24)
BUN: 14 mg/dL (ref 8–27)
Bilirubin Total: 1 mg/dL (ref 0.0–1.2)
CO2: 24 mmol/L (ref 20–29)
Calcium: 9.5 mg/dL (ref 8.6–10.2)
Chloride: 103 mmol/L (ref 96–106)
Creatinine, Ser: 0.66 mg/dL — ABNORMAL LOW (ref 0.76–1.27)
Globulin, Total: 2.4 g/dL (ref 1.5–4.5)
Glucose: 110 mg/dL — ABNORMAL HIGH (ref 70–99)
Potassium: 4.4 mmol/L (ref 3.5–5.2)
Sodium: 142 mmol/L (ref 134–144)
Total Protein: 6.8 g/dL (ref 6.0–8.5)
eGFR: 105 mL/min/{1.73_m2} (ref >59)

## 2022-12-17 LAB — LIPID PANEL
Chol/HDL Ratio: 5.2 {ratio} — ABNORMAL HIGH (ref 0.0–5.0)
Cholesterol, Total: 161 mg/dL (ref 100–199)
HDL: 31 mg/dL — ABNORMAL LOW (ref >39)
LDL Chol Calc (NIH): 104 mg/dL — ABNORMAL HIGH (ref 0–99)
Triglycerides: 148 mg/dL (ref 0–149)
VLDL Cholesterol Cal: 26 mg/dL (ref 5–40)

## 2022-12-17 LAB — HIV ANTIBODY (ROUTINE TESTING W REFLEX): HIV Screen 4th Generation wRfx: NONREACTIVE

## 2022-12-17 LAB — PSA: Prostate Specific Ag, Serum: 0.5 ng/mL (ref 0.0–4.0)

## 2022-12-17 LAB — HEPATITIS C ANTIBODY: Hep C Virus Ab: NONREACTIVE

## 2022-12-19 ENCOUNTER — Other Ambulatory Visit (HOSPITAL_COMMUNITY): Payer: Self-pay

## 2022-12-19 ENCOUNTER — Other Ambulatory Visit: Payer: Self-pay | Admitting: Nurse Practitioner

## 2022-12-19 DIAGNOSIS — E785 Hyperlipidemia, unspecified: Secondary | ICD-10-CM

## 2022-12-19 MED ORDER — ATORVASTATIN CALCIUM 40 MG PO TABS
40.0000 mg | ORAL_TABLET | Freq: Every day | ORAL | 1 refills | Status: DC
Start: 2022-12-19 — End: 2023-03-24
  Filled 2022-12-19: qty 90, 90d supply, fill #0

## 2022-12-19 MED ORDER — ATORVASTATIN CALCIUM 40 MG PO TABS
40.0000 mg | ORAL_TABLET | Freq: Every day | ORAL | 1 refills | Status: DC
Start: 2022-12-19 — End: 2022-12-19

## 2022-12-30 ENCOUNTER — Other Ambulatory Visit (HOSPITAL_COMMUNITY): Payer: Self-pay

## 2023-01-17 ENCOUNTER — Other Ambulatory Visit (HOSPITAL_COMMUNITY): Payer: Self-pay

## 2023-01-17 ENCOUNTER — Other Ambulatory Visit (INDEPENDENT_AMBULATORY_CARE_PROVIDER_SITE_OTHER): Payer: Self-pay | Admitting: Pharmacist

## 2023-01-17 DIAGNOSIS — I700 Atherosclerosis of aorta: Secondary | ICD-10-CM

## 2023-01-17 DIAGNOSIS — E1165 Type 2 diabetes mellitus with hyperglycemia: Secondary | ICD-10-CM

## 2023-01-17 DIAGNOSIS — I10 Essential (primary) hypertension: Secondary | ICD-10-CM

## 2023-01-17 NOTE — Progress Notes (Signed)
01/17/2023 Name: [name] MRN: [id number] DOB: 1960-07-23  Chief Complaint  Patient presents with   Medication Management   Diabetes    [name] is a 62 y.o. year old male who presented for a telephone visit.   They were referred to the pharmacist by their PCP for assistance in managing diabetes, hypertension, and hyperlipidemia. Called with the help of interpreter [name].    Subjective:  Care Team: Primary Care Provider: Donell Beers, FNP ; Next Scheduled Visit: 03/20/23  Medication Access/Adherence  Current Pharmacy:  [facility][id number] Gin[place] Otto, [place] - [address] AT [facility] OF [facility] & [address] [place] [address] Phone: [phone] Fax: [phone]  [facility] LONG - [facility] [address] [place] [address] Phone: [phone] Fax: [phone]   Patient reports affordability concerns with their medications: No  Patient reports access/transportation concerns to their pharmacy: No  Patient reports adherence concerns with their medications:  No    Does report today that he has been taking gabapentin three times daily, denies benefit with gabapentin or ibuprofen for below noted leg pain  Diabetes:  Current medications: metformin 1000 mg twice daily   Hypertension:  Current medications: lisinopril 2.5 mg daily  Hyperlipidemia/ASCVD Risk Reduction  Current lipid lowering medications: atorvastatin 40 mg daily   Reports today that he feels unsteady on his feet, like he is "drunk". He is unsure what is causing this/what makes it worse or better. Notes a cold sensation in his leg and lack of sensation. Does not appear he has seen Vascular. Reports this has been happening since November of last year. Also then reports pain in his arms/hands. He did not hold atorvastatin as instructed at last visit.   Antiplatelet regimen: not taking aspirin,    Objective:  Lab Results   Component Value Date   HGBA1C 6.8 (A) 12/16/2022    Lab Results  Component Value Date   CREATININE 0.66 (L) 12/16/2022   BUN 14 12/16/2022   NA 142 12/16/2022   K 4.4 12/16/2022   CL 103 12/16/2022   CO2 24 12/16/2022    Lab Results  Component Value Date   CHOL 161 12/16/2022   HDL 31 (L) 12/16/2022   LDLCALC 104 (H) 12/16/2022   TRIG 148 12/16/2022   CHOLHDL 5.2 (H) 12/16/2022    Medications Reviewed Today     Reviewed by [name], RPH-CPP (Pharmacist) on 01/17/23 at 1019  Med List Status: <None>   Medication Order Taking? Sig Documenting Provider Last Dose Status Informant    Discontinued 01/17/23 1019 (Completed Course) atorvastatin (LIPITOR) 40 MG tablet [id number] Yes Take 1 tablet (40 mg total) by mouth daily. Phillip Beers, FNP Taking Active    Patient not taking:   Discontinued 01/17/23 1019 (Completed Course)   gabapentin (NEURONTIN) 300 MG capsule [id number] Yes Take 1 capsule (300 mg total) by mouth 3 (three) times daily. Phillip Beers, FNP Taking Active   ibuprofen (ADVIL) 600 MG tablet [id number] No Take 1 tablet (600 mg total) by mouth every 8 (eight) hours as needed.  Patient not taking: Reported on 01/17/2023   Phillip Beers, FNP Not Taking Active   lisinopril (ZESTRIL) 2.5 MG tablet [id number] Yes Take 1 tablet (2.5 mg total) by mouth daily. Phillip Beers, FNP Taking Active   metFORMIN (GLUCOPHAGE) 1000 MG tablet [id number] Yes Take 1 tablet (1,000 mg total) by mouth 2 (two) times daily with  a meal. Phillip Beers, FNP Taking Active    Patient not taking:   Discontinued 01/17/23 1019 (Completed Course)               Assessment/Plan:   Diabetes: - Currently controlled - Recommend to continue current regimen   Hypertension: - Currently controlled - Recommend to continue current regimen   Hyperlipidemia/ASCVD Risk Reduction: - Currently uncontrolled. Symptoms may be statin associated myalgia since atorvastatin  was started last November, though could also be peripheral vascular disease. Recommend to hold atorvastatin for 2 weeks to determine if any change in symptoms. If no change, restart atorvastatin and consider Vascular referral. If symptoms improve, recommend to change to high intensity rosuvastatin instead. Patient verbalizes understanding.     Follow Up Plan: phone call in 2 weeks  [name], PharmD, BCACP, CPP Clinical Pharmacist [facility] [phone]

## 2023-01-18 NOTE — Addendum Note (Signed)
Addended by: Nilda Simmer T on: 01/18/2023 12:02 PM   Modules accepted: Level of Service

## 2023-02-02 ENCOUNTER — Ambulatory Visit (INDEPENDENT_AMBULATORY_CARE_PROVIDER_SITE_OTHER): Payer: Self-pay | Admitting: Pharmacist

## 2023-02-02 DIAGNOSIS — I10 Essential (primary) hypertension: Secondary | ICD-10-CM

## 2023-02-02 DIAGNOSIS — I700 Atherosclerosis of aorta: Secondary | ICD-10-CM

## 2023-02-02 NOTE — Progress Notes (Signed)
02/02/2023 Name: [name] MRN: [id number] DOB: 1961-01-18  Chief Complaint  Patient presents with   Hyperlipidemia    [name] is a 62 y.o. year old male who presented for an in person visit.    They were referred to the pharmacist by their PCP for assistance in managing diabetes, hypertension, and hyperlipidemia. Called with the help of interpreter [place] [phone].   Today, patient reports a scratchy-feeling in stomach and in legs. This started about a year ago. Also, increased urination at night and it that burns if he doesn't go immediately. He is urinating 5-6 times a night.   Subjective:  Care Team: Primary Care Provider: Donell Beers, FNP ; Next Scheduled Visit: 03/20/2023  Medication Access/Adherence  Current Pharmacy:  [facility] #[id number] Ginette Otto, [place] - [address] AT [facility] OF [facility] & [address] [place] [address] [place] [address] Phone: [phone] Fax: [phone]  [facility] - [facility] [address] [place] [address] Phone: [phone] Fax: [phone]   Diabetes:   Current medications: metformin 1000 mg twice daily    Hypertension:   Current medications: lisinopril 2.5 mg daily  Hyperlipidemia/ASCVD Risk Reduction  Current lipid lowering medications: atorvastatin 40 mg daily   ASCVD History: none Family History: diabetes (father) Risk Factors: aortic atherosclerosis, HLD  The 10-year ASCVD risk score (Arnett DK, et al., 2019) is: 29%   Values used to calculate the score:     Age: 41 years     Sex: Male     Is Non-Hispanic African American: No     Diabetic: Yes     Tobacco smoker: Yes     Systolic Blood Pressure: 107 mmHg     Is BP treated: Yes     HDL Cholesterol: 31 mg/dL     Total Cholesterol: 161 mg/dL   Objective:  Lab Results  Component Value Date   HGBA1C 6.8 (A) 12/16/2022    Lab Results  Component Value Date   CREATININE 0.66 (L) 12/16/2022   BUN 14  12/16/2022   NA 142 12/16/2022   K 4.4 12/16/2022   CL 103 12/16/2022   CO2 24 12/16/2022    Lab Results  Component Value Date   CHOL 161 12/16/2022   HDL 31 (L) 12/16/2022   LDLCALC 104 (H) 12/16/2022   TRIG 148 12/16/2022   CHOLHDL 5.2 (H) 12/16/2022    Medications Reviewed Today     Reviewed by Roslyn Smiling, [facility] (Pharmacist) on 02/02/23 at 1343  Med List Status: <None>   Medication Order Taking? Sig Documenting Provider Last Dose Status Informant  atorvastatin (LIPITOR) 40 MG tablet [id number] Yes Take 1 tablet (40 mg total) by mouth daily. Donell Beers, FNP Taking Active   gabapentin (NEURONTIN) 300 MG capsule [id number] Yes Take 1 capsule (300 mg total) by mouth 3 (three) times daily. Donell Beers, FNP Taking Active   ibuprofen (ADVIL) 600 MG tablet [id number] Yes Take 1 tablet (600 mg total) by mouth every 8 (eight) hours as needed. Donell Beers, FNP Taking Active   lisinopril (ZESTRIL) 2.5 MG tablet [id number] Yes Take 1 tablet (2.5 mg total) by mouth daily. Donell Beers, FNP Taking Active   metFORMIN (GLUCOPHAGE) 1000 MG tablet [id number] Yes Take 1 tablet (1,000 mg total) by mouth 2 (two) times daily with a meal. [employer]a, Baird Kay, FNP Taking Active             Diabetes: -  Currently controlled - Recommend to continue current regimen    Hypertension: - Currently controlled - Recommend to continue current regimen   Hyperlipidemia/ASCVD Risk Reduction: - Currently uncontrolled based on last LDL of 104 one month ago. LDL goal <70 - Patient reports no changes in pain or stomach upset since holding statin therapy, therefore, recommend resuming statin therapy  - Recommend to resume atorvastatin 40 mg daily    Pain Management and Urinary Tract Symptoms: - Will connect patient with Mikle Bosworth Research scientist (physical sciences)) so set up patient assistance so patient can see pain specialist and urologist for further management    Follow Up Plan: with PCP in  December   Roslyn Smiling, PharmD PGY1 Pharmacy Resident 02/02/2023 2:55 PM   I have reviewed the pharmacist's encounter and agree with their documentation.   Catie Eppie Gibson, PharmD, BCACP, CPP [facility] [phone]

## 2023-02-02 NOTE — Patient Instructions (Addendum)
Hola Sr. Laury Deep un placer verte hoy!  Contine con todos sus medicamentos, incluida atorvastatina 40 mg al da y gabapentina 300 mg tres veces al [employer].   Lo conectaremos con un asesor financiero para programar asistencia al paciente y ayudarlo a consultar al especialista en dolor y al urlogo para un mayor control.   Su nombre es [name].  [phone] [facility] ([address] [place]; [place]) Cundo: de lunes a viernes de 8 a. m. a 5 p. m. (cerrado de 12:30 a 1:30 p. m. para el almuerzo)  Gracias!  Catie Carmine Savoy farmacutica  Hi [name],   It was a pleasure seeing you today!  Please continue all your medications including atorvastatin 40 mg daily and gabapentin 300 mg three times a day.   We will connect you with a financial counselor to set up patient assistance to help see pain specialist and urologist for further management.   His name is [name] -  [phone] Where: [facility] ([address] in [place]; fourth floor) When: Mondays through Fridays between 8am and 5pm (closed 12:30-1:30pm for lunch)  Thanks!  Catie Clearance Coots, PharmD

## 2023-02-10 ENCOUNTER — Other Ambulatory Visit (HOSPITAL_COMMUNITY): Payer: Self-pay

## 2023-02-10 ENCOUNTER — Other Ambulatory Visit: Payer: Self-pay | Admitting: Nurse Practitioner

## 2023-02-10 DIAGNOSIS — E782 Mixed hyperlipidemia: Secondary | ICD-10-CM

## 2023-02-14 ENCOUNTER — Other Ambulatory Visit (HOSPITAL_COMMUNITY): Payer: Self-pay

## 2023-03-17 ENCOUNTER — Ambulatory Visit: Payer: Self-pay | Admitting: Nurse Practitioner

## 2023-03-20 ENCOUNTER — Ambulatory Visit: Payer: Self-pay | Admitting: Nurse Practitioner

## 2023-03-24 ENCOUNTER — Other Ambulatory Visit: Payer: Self-pay | Admitting: Nurse Practitioner

## 2023-03-24 DIAGNOSIS — G8929 Other chronic pain: Secondary | ICD-10-CM

## 2023-03-24 DIAGNOSIS — E785 Hyperlipidemia, unspecified: Secondary | ICD-10-CM

## 2023-03-24 DIAGNOSIS — M4802 Spinal stenosis, cervical region: Secondary | ICD-10-CM

## 2023-03-24 DIAGNOSIS — E1165 Type 2 diabetes mellitus with hyperglycemia: Secondary | ICD-10-CM

## 2023-03-24 NOTE — Telephone Encounter (Signed)
 Copied from CRM [id number]. Topic: Clinical - Medication Refill >> Mar 24, 2023  2:17 PM Delon DASEN wrote: Most Recent Primary Care Visit:  Provider: HARPER, CATHERINE T  Department: [facility]  Visit Type: OFFICE VISIT  Date: 02/02/2023  Medication: [id number]  gabapentin  (NEURONTIN ) 300 MG  atorvastatin  (LIPITOR) 40 MG tablet ibuprofen  (ADVIL ) 600 MG tablet lisinopril  (ZESTRIL ) 2.5 MG tablet   Has the patient contacted their pharmacy?  (Agent: If no, request that the patient contact the pharmacy for the refill. If patient does not wish to contact the pharmacy document the reason why and proceed with request.) (Agent: If yes, when and what did the pharmacy advise?)  Is this the correct pharmacy for this prescription?  If no, delete pharmacy and type the correct one.  This is the patient's preferred pharmacy:  [facility] #[id number] [place], [place] - [address] AT [facility] OF [facility] & [address] [place] [address] Phone: [phone] Fax: [phone]  [facility] - [facility] [address] [place] [address] Phone: [phone] Fax: [phone]   Has the prescription been filled recently?   Is the patient out of the medication?   Has the patient been seen for an appointment in the last year OR does the patient have an upcoming appointment?   Can we respond through MyChart?   Agent: Please be advised that Rx refills may take up to 3 business days. We ask that you follow-up with your pharmacy.

## 2023-03-26 MED ORDER — LISINOPRIL 2.5 MG PO TABS
2.5000 mg | ORAL_TABLET | Freq: Every day | ORAL | 2 refills | Status: DC
Start: 2023-03-26 — End: 2023-05-04

## 2023-03-26 MED ORDER — IBUPROFEN 600 MG PO TABS: 600.0000 mg | ORAL_TABLET | Freq: Three times a day (TID) | ORAL | 1 refills | Status: DC | PRN

## 2023-03-26 MED ORDER — GABAPENTIN 300 MG PO CAPS
300.0000 mg | ORAL_CAPSULE | Freq: Three times a day (TID) | ORAL | 3 refills | Status: DC
Start: 2023-03-26 — End: 2023-05-04

## 2023-03-26 MED ORDER — ATORVASTATIN CALCIUM 40 MG PO TABS: 40.0000 mg | ORAL_TABLET | Freq: Every day | ORAL | 1 refills | Status: DC

## 2023-04-11 ENCOUNTER — Other Ambulatory Visit: Payer: Self-pay | Admitting: Nurse Practitioner

## 2023-04-11 DIAGNOSIS — E1165 Type 2 diabetes mellitus with hyperglycemia: Secondary | ICD-10-CM

## 2023-04-11 MED ORDER — METFORMIN HCL 1000 MG PO TABS: 1000.0000 mg | ORAL_TABLET | Freq: Two times a day (BID) | ORAL | 2 refills | Status: DC

## 2023-04-11 NOTE — Telephone Encounter (Signed)
Copied from CRM [id number]. Topic: Clinical - Medication Refill >> Apr 11, 2023  9:20 AM Gildardo Pounds wrote: Most Recent Primary Care Visit:  Provider: Nilda Simmer T  Department: [facility]  Visit Type: OFFICE VISIT  Date: 02/02/2023  Medication: ***  Has the patient contacted their pharmacy?  (Agent: If no, request that the patient contact the pharmacy for the refill. If patient does not wish to contact the pharmacy document the reason why and proceed with request.) (Agent: If yes, when and what did the pharmacy advise?)  Is this the correct pharmacy for this prescription?  If no, delete pharmacy and type the correct one.  This is the patient's preferred pharmacy:  [id number] [place], [place] - [address] AT [facility] & [address] [place] [address] Phone: [phone] Fax: [phone]  [facility] [address] [place] [address] Phone: [phone] Fax: [phone]   Has the prescription been filled recently?   Is the patient out of the medication?   Has the patient been seen for an appointment in the last year OR does the patient have an upcoming appointment?   Can we respond through MyChart?   Agent: Please be advised that Rx refills may take up to 3 business days. We ask that you follow-up with your pharmacy.

## 2023-04-13 ENCOUNTER — Other Ambulatory Visit (HOSPITAL_COMMUNITY): Payer: Self-pay

## 2023-04-18 ENCOUNTER — Ambulatory Visit: Payer: Self-pay | Admitting: Nurse Practitioner

## 2023-05-04 ENCOUNTER — Other Ambulatory Visit: Payer: Self-pay | Admitting: Nurse Practitioner

## 2023-05-04 DIAGNOSIS — E1165 Type 2 diabetes mellitus with hyperglycemia: Secondary | ICD-10-CM

## 2023-05-04 DIAGNOSIS — E785 Hyperlipidemia, unspecified: Secondary | ICD-10-CM

## 2023-05-04 DIAGNOSIS — M4802 Spinal stenosis, cervical region: Secondary | ICD-10-CM

## 2023-05-04 DIAGNOSIS — G8929 Other chronic pain: Secondary | ICD-10-CM

## 2023-05-04 NOTE — Telephone Encounter (Signed)
Copied from CRM [id number]. Topic: Clinical - Medication Refill >> May 04, 2023  1:32 PM Carlatta H wrote: Most Recent Primary Care Visit:  Provider: Nilda Simmer T  Department: [facility]  Visit Type: OFFICE VISIT  Date: 02/02/2023  Medication:  atorvastatin (LIPITOR) 40 MG tablet [[id number]] gabapentin (NEURONTIN) 300 MG capsule [[id number]] lisinopril (ZESTRIL) 2.5 MG tablet [[id number]] metFORMIN (GLUCOPHAGE) 1000 MG tablet [[id number]]  Has the patient contacted their pharmacy? No (Agent: If no, request that the patient contact the pharmacy for the refill. If patient does not wish to contact the pharmacy document the reason why and proceed with request.) (Agent: If yes, when and what did the pharmacy advise?)  Is this the correct pharmacy for this prescription? Yes If no, delete pharmacy and type the correct one.  This is the patient's preferred pharmacy:    [facility] [address] [place] [address] Phone: [phone] Fax: [phone]   Has the prescription been filled recently? No  Is the patient out of the medication? Yes  Has the patient been seen for an appointment in the last year OR does the patient have an upcoming appointment? No  Can we respond through MyChart? No  Agent: Please be advised that Rx refills may take up to 3 business days. We ask that you follow-up with your pharmacy.

## 2023-05-05 MED ORDER — GABAPENTIN 300 MG PO CAPS: 300.0000 mg | ORAL_CAPSULE | Freq: Three times a day (TID) | ORAL | 3 refills | Status: DC

## 2023-05-05 MED ORDER — METFORMIN HCL 1000 MG PO TABS
1000.0000 mg | ORAL_TABLET | Freq: Two times a day (BID) | ORAL | 2 refills | Status: DC
Start: 2023-05-05 — End: 2023-06-14

## 2023-05-05 MED ORDER — LISINOPRIL 2.5 MG PO TABS
2.5000 mg | ORAL_TABLET | Freq: Every day | ORAL | 2 refills | Status: DC
Start: 2023-05-05 — End: 2023-05-19

## 2023-05-05 MED ORDER — ATORVASTATIN CALCIUM 40 MG PO TABS
40.0000 mg | ORAL_TABLET | Freq: Every day | ORAL | 1 refills | Status: DC
  Filled 2023-05-08: qty 90, 90d supply, fill #0
  Filled 2023-05-08: qty 30, 30d supply, fill #0

## 2023-05-05 NOTE — Telephone Encounter (Signed)
Please advise [facility]

## 2023-05-08 ENCOUNTER — Other Ambulatory Visit (HOSPITAL_COMMUNITY): Payer: Self-pay

## 2023-05-10 ENCOUNTER — Emergency Department (HOSPITAL_COMMUNITY): Payer: MEDICAID

## 2023-05-10 ENCOUNTER — Ambulatory Visit: Payer: Self-pay | Admitting: Nurse Practitioner

## 2023-05-10 ENCOUNTER — Other Ambulatory Visit: Payer: Self-pay

## 2023-05-10 ENCOUNTER — Encounter (HOSPITAL_COMMUNITY): Payer: Self-pay | Admitting: Internal Medicine

## 2023-05-10 ENCOUNTER — Emergency Department (HOSPITAL_COMMUNITY): Payer: Self-pay

## 2023-05-10 ENCOUNTER — Inpatient Hospital Stay (HOSPITAL_COMMUNITY)
Admission: EM | Admit: 2023-05-10 | Discharge: 2023-05-19 | DRG: 234 | Disposition: A | Discharge status: Home / Self Care | Payer: MEDICAID | Attending: Thoracic Surgery (Cardiothoracic Vascular Surgery) | Admitting: Thoracic Surgery (Cardiothoracic Vascular Surgery)

## 2023-05-10 DIAGNOSIS — E1165 Type 2 diabetes mellitus with hyperglycemia: Secondary | ICD-10-CM | POA: Diagnosis present

## 2023-05-10 DIAGNOSIS — I2510 Atherosclerotic heart disease of native coronary artery without angina pectoris: Secondary | ICD-10-CM

## 2023-05-10 DIAGNOSIS — I10 Essential (primary) hypertension: Secondary | ICD-10-CM | POA: Diagnosis present

## 2023-05-10 DIAGNOSIS — E118 Type 2 diabetes mellitus with unspecified complications: Secondary | ICD-10-CM | POA: Diagnosis present

## 2023-05-10 DIAGNOSIS — M1712 Unilateral primary osteoarthritis, left knee: Secondary | ICD-10-CM | POA: Diagnosis present

## 2023-05-10 DIAGNOSIS — I5022 Chronic systolic (congestive) heart failure: Secondary | ICD-10-CM | POA: Diagnosis present

## 2023-05-10 DIAGNOSIS — E1151 Type 2 diabetes mellitus with diabetic peripheral angiopathy without gangrene: Secondary | ICD-10-CM | POA: Diagnosis present

## 2023-05-10 DIAGNOSIS — E871 Hypo-osmolality and hyponatremia: Secondary | ICD-10-CM | POA: Diagnosis not present

## 2023-05-10 DIAGNOSIS — Z716 Tobacco abuse counseling: Secondary | ICD-10-CM

## 2023-05-10 DIAGNOSIS — Z833 Family history of diabetes mellitus: Secondary | ICD-10-CM

## 2023-05-10 DIAGNOSIS — M5410 Radiculopathy, site unspecified: Secondary | ICD-10-CM | POA: Diagnosis present

## 2023-05-10 DIAGNOSIS — D696 Thrombocytopenia, unspecified: Secondary | ICD-10-CM | POA: Diagnosis not present

## 2023-05-10 DIAGNOSIS — E785 Hyperlipidemia, unspecified: Secondary | ICD-10-CM | POA: Diagnosis present

## 2023-05-10 DIAGNOSIS — I110 Hypertensive heart disease with heart failure: Secondary | ICD-10-CM | POA: Diagnosis present

## 2023-05-10 DIAGNOSIS — I214 Non-ST elevation (NSTEMI) myocardial infarction: Principal | ICD-10-CM | POA: Diagnosis present

## 2023-05-10 DIAGNOSIS — I513 Intracardiac thrombosis, not elsewhere classified: Secondary | ICD-10-CM | POA: Diagnosis present

## 2023-05-10 DIAGNOSIS — M79604 Pain in right leg: Secondary | ICD-10-CM | POA: Diagnosis present

## 2023-05-10 DIAGNOSIS — Z635 Disruption of family by separation and divorce: Secondary | ICD-10-CM | POA: Diagnosis not present

## 2023-05-10 DIAGNOSIS — T502X5A Adverse effect of carbonic-anhydrase inhibitors, benzothiadiazides and other diuretics, initial encounter: Secondary | ICD-10-CM | POA: Diagnosis not present

## 2023-05-10 DIAGNOSIS — Z7984 Long term (current) use of oral hypoglycemic drugs: Secondary | ICD-10-CM | POA: Diagnosis not present

## 2023-05-10 DIAGNOSIS — Z951 Presence of aortocoronary bypass graft: Secondary | ICD-10-CM

## 2023-05-10 DIAGNOSIS — I739 Peripheral vascular disease, unspecified: Secondary | ICD-10-CM | POA: Diagnosis present

## 2023-05-10 DIAGNOSIS — J9811 Atelectasis: Secondary | ICD-10-CM | POA: Diagnosis not present

## 2023-05-10 DIAGNOSIS — Z7982 Long term (current) use of aspirin: Secondary | ICD-10-CM

## 2023-05-10 DIAGNOSIS — Y92239 Unspecified place in hospital as the place of occurrence of the external cause: Secondary | ICD-10-CM | POA: Diagnosis not present

## 2023-05-10 DIAGNOSIS — Z79899 Other long term (current) drug therapy: Secondary | ICD-10-CM

## 2023-05-10 DIAGNOSIS — F17210 Nicotine dependence, cigarettes, uncomplicated: Secondary | ICD-10-CM | POA: Diagnosis present

## 2023-05-10 DIAGNOSIS — K5900 Constipation, unspecified: Secondary | ICD-10-CM | POA: Diagnosis not present

## 2023-05-10 DIAGNOSIS — E1142 Type 2 diabetes mellitus with diabetic polyneuropathy: Secondary | ICD-10-CM | POA: Diagnosis present

## 2023-05-10 DIAGNOSIS — Z8249 Family history of ischemic heart disease and other diseases of the circulatory system: Secondary | ICD-10-CM

## 2023-05-10 DIAGNOSIS — E782 Mixed hyperlipidemia: Secondary | ICD-10-CM | POA: Diagnosis present

## 2023-05-10 DIAGNOSIS — I249 Acute ischemic heart disease, unspecified: Secondary | ICD-10-CM

## 2023-05-10 LAB — COMPREHENSIVE METABOLIC PANEL
ALT: 20 U/L (ref 0–44)
AST: 29 U/L (ref 15–41)
Albumin: 4.3 g/dL (ref 3.5–5.0)
Alkaline Phosphatase: 93 U/L (ref 38–126)
Anion gap: 8 (ref 5–15)
BUN: 16 mg/dL (ref 8–23)
CO2: 26 mmol/L (ref 22–32)
Calcium: 9.7 mg/dL (ref 8.9–10.3)
Chloride: 103 mmol/L (ref 98–111)
Creatinine, Ser: 0.69 mg/dL (ref 0.61–1.24)
GFR, Estimated: >60 mL/min (ref >60)
Glucose, Bld: 124 mg/dL — ABNORMAL HIGH (ref 70–99)
Potassium: 4 mmol/L (ref 3.5–5.1)
Sodium: 137 mmol/L (ref 135–145)
Total Bilirubin: 1.6 mg/dL — ABNORMAL HIGH (ref 0.0–1.2)
Total Protein: 8 g/dL (ref 6.5–8.1)

## 2023-05-10 LAB — TROPONIN I (HIGH SENSITIVITY)
Troponin I (High Sensitivity): 3456 ng/L (ref <18)
Troponin I (High Sensitivity): 3333 ng/L (ref <18)

## 2023-05-10 LAB — CBC
HCT: 47.3 % (ref 39.0–52.0)
Hemoglobin: 15.9 g/dL (ref 13.0–17.0)
MCH: 31.5 pg (ref 26.0–34.0)
MCHC: 33.6 g/dL (ref 30.0–36.0)
MCV: 93.8 fL (ref 80.0–100.0)
Platelets: 222 10*3/uL (ref 150–400)
RBC: 5.04 MIL/uL (ref 4.22–5.81)
RDW: 12.8 % (ref 11.5–15.5)
WBC: 7 10*3/uL (ref 4.0–10.5)
nRBC: 0 % (ref 0.0–0.2)

## 2023-05-10 LAB — LIPASE, BLOOD: Lipase: 35 U/L (ref 11–51)

## 2023-05-10 LAB — CBG MONITORING, ED
Glucose-Capillary: 115 mg/dL — ABNORMAL HIGH (ref 70–99)
Glucose-Capillary: 111 mg/dL — ABNORMAL HIGH (ref 70–99)
Glucose-Capillary: 95 mg/dL (ref 70–99)

## 2023-05-10 LAB — PROTIME-INR
INR: 1 (ref 0.8–1.2)
Prothrombin Time: 13.2 s (ref 11.4–15.2)

## 2023-05-10 LAB — URINALYSIS, ROUTINE W REFLEX MICROSCOPIC
Bilirubin Urine: NEGATIVE
Glucose, UA: NEGATIVE mg/dL
Hgb urine dipstick: NEGATIVE
Ketones, ur: NEGATIVE mg/dL
Leukocytes,Ua: NEGATIVE
Nitrite: NEGATIVE
Protein, ur: NEGATIVE mg/dL
Specific Gravity, Urine: 1.012 (ref 1.005–1.030)
pH: 6 (ref 5.0–8.0)

## 2023-05-10 LAB — HEMOGLOBIN A1C
Hgb A1c MFr Bld: 6.7 % — ABNORMAL HIGH (ref 4.8–5.6)
Mean Plasma Glucose: 145.59 mg/dL

## 2023-05-10 LAB — APTT: aPTT: 26 s (ref 24–36)

## 2023-05-10 LAB — HEPARIN LEVEL (UNFRACTIONATED): Heparin Unfractionated: 0.14 [IU]/mL — ABNORMAL LOW (ref 0.30–0.70)

## 2023-05-10 MED ORDER — GABAPENTIN 300 MG PO CAPS
300.0000 mg | ORAL_CAPSULE | Freq: Three times a day (TID) | ORAL | Status: DC
  Administered 2023-05-10 – 2023-05-15 (×15): 300 mg via ORAL
  Filled 2023-05-10 (×15): qty 1

## 2023-05-10 MED ORDER — HEPARIN BOLUS VIA INFUSION
2000.0000 [IU] | Freq: Once | INTRAVENOUS | Status: AC
  Administered 2023-05-10: 2000 [IU] via INTRAVENOUS
  Filled 2023-05-10: qty 2000

## 2023-05-10 MED ORDER — ONDANSETRON HCL 4 MG/2ML IJ SOLN: 4.0000 mg | Freq: Four times a day (QID) | INTRAMUSCULAR | Status: DC | PRN

## 2023-05-10 MED ORDER — INSULIN ASPART 100 UNIT/ML IJ SOLN
0.0000 [IU] | INTRAMUSCULAR | Status: DC
  Administered 2023-05-11: 5 [IU] via SUBCUTANEOUS
  Administered 2023-05-11 – 2023-05-12 (×2): 2 [IU] via SUBCUTANEOUS
  Administered 2023-05-12: 3 [IU] via SUBCUTANEOUS
  Administered 2023-05-12 (×2): 5 [IU] via SUBCUTANEOUS
  Administered 2023-05-12 – 2023-05-13 (×2): 2 [IU] via SUBCUTANEOUS
  Administered 2023-05-13: 3 [IU] via SUBCUTANEOUS
  Administered 2023-05-13: 2 [IU] via SUBCUTANEOUS
  Filled 2023-05-10: qty 0.15

## 2023-05-10 MED ORDER — ASPIRIN 81 MG PO TBEC: 81.0000 mg | DELAYED_RELEASE_TABLET | Freq: Every day | ORAL | Status: DC

## 2023-05-10 MED ORDER — IBUPROFEN 600 MG PO TABS: 600.0000 mg | ORAL_TABLET | Freq: Three times a day (TID) | ORAL | Status: DC | PRN

## 2023-05-10 MED ORDER — HEPARIN BOLUS VIA INFUSION
4000.0000 [IU] | Freq: Once | INTRAVENOUS | Status: AC
  Administered 2023-05-10: 4000 [IU] via INTRAVENOUS
  Filled 2023-05-10: qty 4000

## 2023-05-10 MED ORDER — HEPARIN (PORCINE) 25000 UT/250ML-% IV SOLN
1050.0000 [IU]/h | INTRAVENOUS | Status: DC
  Administered 2023-05-10: 850 [IU]/h via INTRAVENOUS
  Administered 2023-05-11: 1050 [IU]/h via INTRAVENOUS
  Filled 2023-05-10 (×2): qty 250

## 2023-05-10 MED ORDER — METOPROLOL TARTRATE 25 MG PO TABS
25.0000 mg | ORAL_TABLET | Freq: Two times a day (BID) | ORAL | Status: DC
  Administered 2023-05-10 – 2023-05-15 (×10): 25 mg via ORAL
  Filled 2023-05-10 (×10): qty 1

## 2023-05-10 MED ORDER — NITROGLYCERIN 0.4 MG SL SUBL: 0.4000 mg | SUBLINGUAL_TABLET | SUBLINGUAL | Status: DC | PRN

## 2023-05-10 MED ORDER — LISINOPRIL 2.5 MG PO TABS
2.5000 mg | ORAL_TABLET | Freq: Every day | ORAL | Status: DC
  Filled 2023-05-10: qty 1

## 2023-05-10 MED ORDER — ATORVASTATIN CALCIUM 40 MG PO TABS
40.0000 mg | ORAL_TABLET | Freq: Every day | ORAL | Status: DC
  Administered 2023-05-10: 40 mg via ORAL
  Filled 2023-05-10: qty 1

## 2023-05-10 MED ORDER — ASPIRIN 81 MG PO CHEW
324.0000 mg | CHEWABLE_TABLET | Freq: Once | ORAL | Status: AC
  Administered 2023-05-10: 324 mg via ORAL
  Filled 2023-05-10: qty 4

## 2023-05-10 NOTE — Progress Notes (Signed)
 PHARMACY - ANTICOAGULATION CONSULT NOTE  Pharmacy Consult for Heparin  Indication:  NSTEMI  No Known Allergies  Patient Measurements: Height: 5\' 3"  (160 cm) Weight: 72.6 kg (160 lb) IBW/kg (Calculated) : 56.9 Heparin Dosing Weight: 72 kg  Vital Signs: Temp: 98.1 F (36.7 C) (02/19 1950) Temp Source: Oral (02/19 1950) BP: 111/76 (02/19 2136) Pulse Rate: 90 (02/19 2136)  Labs: Recent Labs    05/10/23 1153 05/10/23 1423 05/10/23 1540 05/10/23 1727 05/10/23 2229  HGB 15.9  --   --   --   --   HCT 47.3  --   --   --   --   PLT 222  --   --   --   --   APTT  --   --  26  --   --   LABPROT  --   --  13.2  --   --   INR  --   --  1.0  --   --   HEPARINUNFRC  --   --   --   --  0.14*  CREATININE 0.69  --   --   --   --   TROPONINIHS  --  3,456*  --  3,333*  --     Estimated Creatinine Clearance: 83.4 mL/min (by C-G formula based on SCr of 0.69 mg/dL).   Medical History: Past Medical History:  Diagnosis Date   Arthritis of left knee    Cervical stenosis of spine    Diabetes mellitus without complication (HCC)    Hyperlipidemia LDL goal <70    Primary hypertension 02/07/2022   Tobacco use     Medications:  Scheduled:  Infusions:   Assessment: 4 yoM presented to ED on 2/19 with abdominal pain and found to have abnormal EKG and troponin elevated at 3456.  Pharmacy is consulted to dose heparin IV. No prior to admission anticoagulation noted.   SCr < 1 Tbili 1.6 CBC: Hgb and Plt WNL  1st HL 0.14 subtherapeutic on 850 units/hr No bleeding noted  Goal of Therapy:  Heparin level 0.3-0.7 units/ml Monitor platelets by anticoagulation protocol: Yes   Plan:  Give heparin 2000 units bolus IV x 1 Increase heparin drip to 1050 Heparin level in 6 hours Daily heparin level and CBC  Arley Phenix RPh 05/10/2023, 10:58 PM

## 2023-05-10 NOTE — ED Provider Notes (Signed)
 [place] EMERGENCY DEPARTMENT AT [facility] Provider Note   CSN: [id number] Arrival date & time: 05/10/23  1131     History  Chief Complaint  Patient presents with   Abdominal Pain   Chest Pain    [name] is a 63 y.o. male.  63 year old male presents with abdominal pain for about a week.  Pain has been intermittent and worse at times when he eats.  Pain starts in his right lower quadrant and goes up into the right side of his chest.  No associated diaphoresis or dyspnea.  No associated fever, persistent pain.  Pain is relieved after he drinks milk.  States he feels at the something moving in his abdomen.  Denies any syncope or near syncope.       Home Medications Prior to Admission medications   Medication Sig Start Date End Date Taking? Authorizing Provider  atorvastatin (LIPITOR) 40 MG tablet Take 1 tablet (40 mg total) by mouth daily. 05/05/23   Paseda, Baird Kay, FNP  gabapentin (NEURONTIN) 300 MG capsule Take 1 capsule (300 mg total) by mouth 3 (three) times daily. 05/05/23 08/03/23  Donell Beers, FNP  ibuprofen (ADVIL) 600 MG tablet Take 1 tablet (600 mg total) by mouth every 8 (eight) hours as needed. 03/26/23   Paseda, Baird Kay, FNP  lisinopril (ZESTRIL) 2.5 MG tablet Take 1 tablet (2.5 mg total) by mouth daily. 05/05/23   Donell Beers, FNP  metFORMIN (GLUCOPHAGE) 1000 MG tablet Take 1 tablet (1,000 mg total) by mouth 2 (two) times daily with a meal. 05/05/23   Paseda, Baird Kay, FNP      Allergies    Patient has no known allergies.    Review of Systems   Review of Systems  All other systems reviewed and are negative.   Physical Exam Updated Vital Signs BP 125/87   Pulse 90   Temp 98.1 F (36.7 C) (Oral)   Resp 14   Ht 1.6 m (5\' 3" )   Wt 72.6 kg   SpO2 100%   BMI 28.34 kg/m  Physical Exam Vitals and nursing note reviewed.  Constitutional:      General: He is not in acute distress.    Appearance: Normal appearance. He is  well-developed. He is not toxic-appearing.  HENT:     Head: Normocephalic and atraumatic.  Eyes:     General: Lids are normal.     Conjunctiva/sclera: Conjunctivae normal.     Pupils: Pupils are equal, round, and reactive to light.  Neck:     Thyroid: No thyroid mass.     Trachea: No tracheal deviation.  Cardiovascular:     Rate and Rhythm: Normal rate and regular rhythm.     Heart sounds: Normal heart sounds. No murmur heard.    No gallop.  Pulmonary:     Effort: Pulmonary effort is normal. No respiratory distress.     Breath sounds: Normal breath sounds. No stridor. No decreased breath sounds, wheezing, rhonchi or rales.  Abdominal:     General: There is no distension.     Palpations: Abdomen is soft.     Tenderness: There is no abdominal tenderness. There is no rebound.  Musculoskeletal:        General: No tenderness. Normal range of motion.     Cervical back: Normal range of motion and neck supple.  Skin:    General: Skin is warm and dry.     Findings: No abrasion or rash.  Neurological:  Mental Status: He is alert and oriented to person, place, and time. Mental status is at baseline.     GCS: GCS eye subscore is 4. GCS verbal subscore is 5. GCS motor subscore is 6.     Cranial Nerves: No cranial nerve deficit.     Sensory: No sensory deficit.     Motor: Motor function is intact.  Psychiatric:        Attention and Perception: Attention normal.        Speech: Speech normal.        Behavior: Behavior normal.     ED Results / Procedures / Treatments   Labs (all labs ordered are listed, but only abnormal results are displayed) Labs Reviewed  COMPREHENSIVE METABOLIC PANEL - Abnormal; Notable for the following components:      Result Value   Glucose, Bld 124 (*)    Total Bilirubin 1.6 (*)    All other components within normal limits  LIPASE, BLOOD  CBC  URINALYSIS, ROUTINE W REFLEX MICROSCOPIC  TROPONIN I (HIGH SENSITIVITY)    EKG EKG  Interpretation Date/Time:  Wednesday May 10 2023 11:40:13 EST Ventricular Rate:  85 PR Interval:  142 QRS Duration:  93 QT Interval:  373 QTC Calculation: 444 R Axis:   45  Text Interpretation: Sinus rhythm Probable anterior infarct, age indeterminate Confirmed by Lorre Nick ([id number]) on 05/10/2023 1:58:28 PM  Radiology No results found.  Procedures Procedures    Medications Ordered in ED Medications - No data to display  ED Course/ Medical Decision Making/ A&P                                 Medical Decision Making Amount and/or Complexity of Data Reviewed Labs: ordered. Radiology: ordered.  Risk OTC drugs.   Patient's EKG when compared to prior shows normal sinus rhythm with biphasic T waves in V4 and new inverted T wave in V5.  Patient has no complaints of chest pain or chest pressure at this time.  Concern for possible abdominal etiology and CT abdomen was ordered without contrast.  Is pending at this time.  Patient's chest x-ray is pending at this time.  Troponin did come back significant elevated at 3456.  Concern for NSTEMI.  Discussed with Dr. [place] from cardiology.  Patient had received aspirin prior to this and patient will be given IV heparin.  Patient will be transferred to [facility] on the hospitalist service.  Will consult hospitalist team here  CRITICAL CARE Performed by: Toy Baker Total critical care time: 50 minutes Critical care time was exclusive of separately billable procedures and treating other patients. Critical care was necessary to treat or prevent imminent or life-threatening deterioration. Critical care was time spent personally by me on the following activities: development of treatment plan with patient and/or surrogate as well as nursing, discussions with consultants, evaluation of patient's response to treatment, examination of patient, obtaining history from patient or surrogate, ordering and performing treatments and  interventions, ordering and review of laboratory studies, ordering and review of radiographic studies, pulse oximetry and re-evaluation of patient's condition.         Final Clinical Impression(s) / ED Diagnoses Final diagnoses:  None    Rx / DC Orders ED Discharge Orders     None         Lorre Nick, MD 05/10/23 1530

## 2023-05-10 NOTE — Telephone Encounter (Signed)
  Chief Complaint: chest pain, stomach pain Symptoms: pain radiating from stomach to chest, rated severe, comes and goes- present now Frequency: patient states he has had pain since Monday night Pertinent Negatives: Patient denies nausea, vomiting Disposition: [x] ED /[] Urgent Care (no appt availability in office) / [] Appointment(In office/virtual)/ []  [facility]/ [] Home Care/ [] Refused Recommended Disposition /[] [place]/ []  Follow-up with PCP Additional Notes: Patient advised per protocol to go to ED for evaluation of chest pain- high risk compliant   Copied from CRM [id number]. Topic: Clinical - Red Word Triage >> May 10, 2023  9:10 AM Turkey B wrote: Pt having chest and stomach pain Reason for Disposition  SEVERE chest pain  Answer Assessment - Initial Assessment Questions 1. LOCATION: "Where does it hurt?"       Stomach to chest 2. RADIATION: "Does the pain go anywhere else?" (e.g., into neck, jaw, arms, back)     Stomach to chest 3. ONSET: "When did the chest pain begin?" (Minutes, hours or days)      Monday night 4. PATTERN: "Does the pain come and go, or has it been constant since it started?"  "Does it get worse with exertion?"      Comes and goes- stomach feels painful-R side 5. DURATION: "How long does it last" (e.g., seconds, minutes, hours)     1-2 hours 6. SEVERITY: "How bad is the pain?"  (e.g., Scale 1-10; mild, moderate, or severe)    - MILD (1-3): doesn't interfere with normal activities     - MODERATE (4-7): interferes with normal activities or awakens from sleep    - SEVERE (8-10): excruciating pain, unable to do any normal activities       8/10 7. CARDIAC RISK FACTORS: "Do you have any history of heart problems or risk factors for heart disease?" (e.g., angina, prior heart attack; diabetes, high blood pressure, high cholesterol, smoker, or strong family history of heart disease)     hypertension 8. PULMONARY RISK FACTORS: "Do you have any  history of lung disease?"  (e.g., blood clots in lung, asthma, emphysema, birth control pills)     no 9. CAUSE: "What do you think is causing the chest pain?"     reflux 10. OTHER SYMPTOMS: "Do you have any other symptoms?" (e.g., dizziness, nausea, vomiting, sweating, fever, difficulty breathing, cough)       Nocturia, stomach pain  Protocols used: Chest Pain-A-AH

## 2023-05-10 NOTE — Assessment & Plan Note (Addendum)
 BP stable at admission  Plan Continue home regimen: ACE-I  Will add B-blocker - watch for hypotension

## 2023-05-10 NOTE — Progress Notes (Signed)
 PHARMACY - ANTICOAGULATION CONSULT NOTE  Pharmacy Consult for Heparin  Indication:  NSTEMI  No Known Allergies  Patient Measurements: Height: 5\' 3"  (160 cm) Weight: 72.6 kg (160 lb) IBW/kg (Calculated) : 56.9 Heparin Dosing Weight: 72 kg  Vital Signs: Temp: 98.1 F (36.7 C) (02/19 1142) Temp Source: Oral (02/19 1142) BP: 117/77 (02/19 1430) Pulse Rate: 76 (02/19 1430)  Labs: Recent Labs    05/10/23 1153 05/10/23 1423  HGB 15.9  --   HCT 47.3  --   PLT 222  --   CREATININE 0.69  --   TROPONINIHS  --  3,456*    Estimated Creatinine Clearance: 83.4 mL/min (by C-G formula based on SCr of 0.69 mg/dL).   Medical History: Past Medical History:  Diagnosis Date   Arthritis of left knee    Cervical stenosis of spine    Diabetes mellitus without complication (HCC)    Hyperlipidemia LDL goal <70    Primary hypertension 02/07/2022   Tobacco use     Medications:  Scheduled:  Infusions:   Assessment: 57 yoM presented to ED on 2/19 with abdominal pain and found to have abnormal EKG and troponin elevated at 3456.  Pharmacy is consulted to dose heparin IV. No prior to admission anticoagulation noted.   SCr < 1 Tbili 1.6 CBC: Hgb and Plt WNL  Goal of Therapy:  Heparin level 0.3-0.7 units/ml Monitor platelets by anticoagulation protocol: Yes   Plan:  Give heparin 4000 units bolus IV x 1 Start heparin IV infusion at 850 units/hr Heparin level 6 hours after starting Daily heparin level and CBC   Lynann Beaver PharmD, BCPS [facility] main pharmacy [phone] 05/10/2023 3:34 PM

## 2023-05-10 NOTE — Assessment & Plan Note (Signed)
 Patient has been diagnosed with PAD in the past and has seen vascular surgery for left leg pain.He does endorse having very cold feet, claudication with exertion. On Exam - trace DP right foot, slow capillary refill  Plan ON heparin  At d/c continue ASA  As outpatient vascular evaluation

## 2023-05-10 NOTE — Assessment & Plan Note (Signed)
 Last lipid panel 9/ '24 - 308/31/104. He does take statin medication.  Plan Lipid panel in AM  Continue Lipitor 40 mg

## 2023-05-10 NOTE — H&P (Signed)
 History and Physical    [name] ZOX:[id number] DOB: 12-06-1960 DOA: 05/10/2023  DOS: the patient was seen and examined on 05/10/2023  PCP: Donell Beers, FNP   Patient coming from: Home  I have personally briefly reviewed patient's old medical records in [facility]  [name], a 63 y/o with DM, HTN, HLD, tobacco abuse reports a week of intermittent abdominal pain RLQ with radiation to his chest. He has not had N/V/D,  no active GI symptoms - hemtemesis, change in bowel habit. He has not had previous problems with chest pain. Due to his persistent intermittent discomfort he presents to [facility] for evaluation.  Of not his last CPP visit for chronic disease monitoring was 02/02/23 - he was deemed stable at that time.    ED Course: afebrile, 117/77  HR 76  RR 17  BMI 28. Patient is awake, alert and in no distress. At time of exam denies having chest pain, SOB. Lab - glucose 124, Cr 0.69 T. Bili 1.6  CBC nl, U/A negative, Troponin #1  3,456. EKG - Sinus rhythm, 2mm ST elevation V2,V3, 1mm ST elevation V4, t-wave inversion V4, V5. EDP consulted cardiology - recommended ASA, IV heparin, transfer to [facility]. Cardiology will follow.  Review of Systems:  Review of Systems  Constitutional:  Negative for chills, fever and weight loss.  HENT: Negative.    Eyes: Negative.   Respiratory: Negative.    Cardiovascular:  Positive for chest pain and claudication.  Gastrointestinal:  Positive for abdominal pain. Negative for blood in stool.       RLQ intermittent discomfort - "feels like something inside."   Genitourinary: Negative.   Skin: Negative.   Neurological:  Positive for sensory change.       Hyperesthesia both feet.   Endo/Heme/Allergies:  Does not bruise/bleed easily.  Psychiatric/Behavioral: Negative.      Past Medical History:  Diagnosis Date   Arthritis of left knee    Cervical stenosis of spine    Diabetes mellitus without complication (HCC)    Hyperlipidemia LDL goal <70     Primary hypertension 02/07/2022   Tobacco use     Soc Hx - married but separated, 4 children, 9 grandchildren. 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  History reviewed. No pertinent surgical history.   reports that he has been smoking cigarettes. He uses smokeless tobacco. He reports current alcohol use of about 2.0 standard drinks of alcohol per week. He reports that he does not currently use drugs.  No Known Allergies  Family History  Problem Relation Age of Onset   Diabetes Father    Colon cancer Neg Hx    Prostate cancer Neg Hx     Prior to Admission medications   Medication Sig Start Date End Date Taking? Authorizing Provider  atorvastatin (LIPITOR) 40 MG tablet Take 1 tablet (40 mg total) by mouth daily. 05/05/23   Paseda, Baird Kay, FNP  gabapentin (NEURONTIN) 300 MG capsule Take 1 capsule (300 mg total) by mouth 3 (three) times daily. 05/05/23 08/03/23  Donell Beers, FNP  ibuprofen (ADVIL) 600 MG tablet Take 1 tablet (600 mg total) by mouth every 8 (eight) hours as needed. 03/26/23   Paseda, Baird Kay, FNP  lisinopril (ZESTRIL) 2.5 MG tablet Take 1 tablet (2.5 mg total) by mouth daily. 05/05/23   Donell Beers, FNP  metFORMIN (GLUCOPHAGE) 1000 MG tablet Take 1 tablet (1,000 mg total) by mouth 2 (two) times daily with a meal. 05/05/23  Donell Beers, FNP    Physical Exam: Vitals:   05/10/23 1143 05/10/23 1430 05/10/23 1556 05/10/23 1558  BP: 125/87 117/77 121/76   Pulse: 90 76 81   Resp: 14 17 14    Temp:    98.4 F (36.9 C)  TempSrc:    Oral  SpO2: 100% 98% 98%   Weight: 72.6 kg     Height: 5\' 3"  (1.6 m)       Physical Exam Vitals and nursing note reviewed.  Constitutional:      General: He is not in acute distress.    Appearance: He is well-developed and normal weight. He is not toxic-appearing.  HENT:     Head: Normocephalic and atraumatic.  Cardiovascular:     Rate and Rhythm: Normal rate and regular rhythm.     Heart  sounds: Normal heart sounds. No murmur heard. Pulmonary:     Effort: Pulmonary effort is normal.     Breath sounds: Normal breath sounds.  Abdominal:     General: Abdomen is flat. Bowel sounds are normal. There is no distension.     Palpations: Abdomen is soft. There is no mass.     Tenderness: There is abdominal tenderness in the right lower quadrant. There is no guarding or rebound.     Hernia: No hernia is present.  Skin:    General: Skin is warm and dry.     Capillary Refill: Capillary refill takes 2 to 3 seconds.     Coloration: Skin is not jaundiced.     Findings: No rash.  Neurological:     General: No focal deficit present.     Mental Status: He is alert and oriented to person, place, and time.  Psychiatric:        Mood and Affect: Mood normal.        Behavior: Behavior normal.      Labs on Admission: I have personally reviewed following labs and imaging studies  CBC: Recent Labs  Lab 05/10/23 1153  WBC 7.0  HGB 15.9  HCT 47.3  MCV 93.8  PLT 222   Basic Metabolic Panel: Recent Labs  Lab 05/10/23 1153  NA 137  K 4.0  CL 103  CO2 26  GLUCOSE 124*  BUN 16  CREATININE 0.69  CALCIUM 9.7   GFR: Estimated Creatinine Clearance: 83.4 mL/min (by C-G formula based on SCr of 0.69 mg/dL). Liver Function Tests: Recent Labs  Lab 05/10/23 1153  AST 29  ALT 20  ALKPHOS 93  BILITOT 1.6*  PROT 8.0  ALBUMIN 4.3   Recent Labs  Lab 05/10/23 1153  LIPASE 35   No results for input(s): "AMMONIA" in the last 168 hours. Coagulation Profile: No results for input(s): "INR", "PROTIME" in the last 168 hours. Cardiac Enzymes: No results for input(s): "CKTOTAL", "CKMB", "CKMBINDEX", "TROPONINI" in the last 168 hours. BNP (last 3 results) No results for input(s): "PROBNP" in the last 8760 hours. HbA1C: No results for input(s): "HGBA1C" in the last 72 hours. CBG: No results for input(s): "GLUCAP" in the last 168 hours. Lipid Profile: No results for input(s): "CHOL",  "HDL", "LDLCALC", "TRIG", "CHOLHDL", "LDLDIRECT" in the last 72 hours. Thyroid Function Tests: No results for input(s): "TSH", "T4TOTAL", "FREET4", "T3FREE", "THYROIDAB" in the last 72 hours. Anemia Panel: No results for input(s): "VITAMINB12", "FOLATE", "FERRITIN", "TIBC", "IRON", "RETICCTPCT" in the last 72 hours. Urine analysis:    Component Value Date/Time   COLORURINE YELLOW 05/10/2023 1153   APPEARANCEUR CLEAR 05/10/2023 1153   LABSPEC  1.012 05/10/2023 1153   PHURINE 6.0 05/10/2023 1153   GLUCOSEU NEGATIVE 05/10/2023 1153   HGBUR NEGATIVE 05/10/2023 1153   BILIRUBINUR NEGATIVE 05/10/2023 1153   BILIRUBINUR negative 12/16/2022 0922   KETONESUR NEGATIVE 05/10/2023 1153   PROTEINUR NEGATIVE 05/10/2023 1153   UROBILINOGEN 1.0 12/16/2022 0922   UROBILINOGEN 0.2 05/10/2010 0858   NITRITE NEGATIVE 05/10/2023 1153   LEUKOCYTESUR NEGATIVE 05/10/2023 1153    Radiological Exams on Admission: I have personally reviewed images CT ABDOMEN PELVIS WO CONTRAST Result Date: 05/10/2023 CLINICAL DATA:  Abdominal pain. EXAM: CT ABDOMEN AND PELVIS WITHOUT CONTRAST TECHNIQUE: Multidetector CT imaging of the abdomen and pelvis was performed following the standard protocol without IV contrast. RADIATION DOSE REDUCTION: This exam was performed according to the departmental dose-optimization program which includes automated exposure control, adjustment of the mA and/or kV according to patient size and/or use of iterative reconstruction technique. COMPARISON:  CT abdomen pelvis dated 09/06/2022. FINDINGS: Evaluation of this exam is limited in the absence of intravenous contrast. Lower chest: Bibasilar subpleural atelectasis/scarring. No intra-abdominal free air or free fluid. Hepatobiliary: The liver is unremarkable. No biliary dilatation. The gallbladder is unremarkable. Pancreas: Unremarkable. No pancreatic ductal dilatation or surrounding inflammatory changes. Spleen: Normal in size without focal abnormality.  Adrenals/Urinary Tract: The adrenal glands unremarkable. There is no hydronephrosis on limited physis on either side. The visualized ureters and urinary bladder appear unremarkable. Stomach/Bowel: There is sigmoid diverticulosis and scattered colonic diverticula. There is moderate stool throughout the colon. No bowel obstruction or active inflammation. The appendix is normal. Vascular/Lymphatic: Mild aortoiliac atherosclerotic disease. The IVC is unremarkable. No portal venous gas. There is no adenopathy. Reproductive: The prostate and seminal vesicles are grossly unremarkable. Other: Small fat containing left inguinal hernia. Musculoskeletal: Osteopenia with degenerative changes of the spine. Multilevel disc desiccation and vacuum phenomena. No acute osseous pathology. IMPRESSION: 1. No acute intra-abdominal or pelvic pathology. 2. Colonic diverticulosis.  No bowel obstruction. Normal appendix. 3.  Aortic Atherosclerosis (ICD10-I70.0). Electronically Signed   By: Elgie Collard M.D.   On: 05/10/2023 15:28    EKG: I have personally reviewed EKG: sinus rhythm, 2mm ST elevation V2V3, 1mm ST elevation V4, T-wave inversion V4,V5  Assessment/Plan Active Problems:   NSTEMI (non-ST elevated myocardial infarction) (HCC)   PAD (peripheral artery disease) (HCC)   Type 2 diabetes mellitus with hyperglycemia (HCC)   Primary hypertension   Dyslipidemia, goal LDL below 70    Assessment and Plan: NSTEMI (non-ST elevated myocardial infarction) [facility]) Patient with atypical chest pain. He has multiple risk factors with DM, HTN, HLD, tobacco use, age. Heart RISK score 8. EKG with 1-4mm ST elevation V2, V3, 1mm ST elevation V4, T-wave inversion V4, V5. Initial troponin 4, 456. EDP spoke with Cardiology (Dr. [place]) who recommended ASA, Heparin infusion and transfer to [facility]  Plan Continue Heparin  ASA started  Continue cardiac meds including ACE-I  Add meoprolol 25 mg BID  IVF  MS for uncontrolled  pain  NPO  Cardiology to see for change in symptoms or on rounds.   PAD (peripheral artery disease) (HCC) Patient has been diagnosed with PAD in the past and has seen vascular surgery for left leg pain.He does endorse having very cold feet, claudication with exertion. On Exam - trace DP right foot, slow capillary refill  Plan ON heparin  At d/c continue ASA  As outpatient vascular evaluation  Dyslipidemia, goal LDL below 70 Last lipid panel 9/ '24 - 308/31/104. He does take statin medication.  Plan Lipid panel  in AM  Continue Lipitor 40 mg  Primary hypertension BP stable at admission  Plan Continue home regimen: ACE-I  Will add B-blocker - watch for hypotension  Type 2 diabetes mellitus with hyperglycemia (HCC) Last A1C 6.8% Sept '24. Glucose at admission 124.  Plan Hold metformin in acute setting  Ss coverage       DVT prophylaxis: IV heparin gtts Code Status: Full Code Family Communication: spoke with Jose-friend-at patient's request. Poor English skills Tele: [phone]  Disposition Plan: TBD  Consults called: Cardiology - Dr. Swazil[place]  Admission status: Inpatient, Telemetry bed   Illene Regulus, MD [facility] 05/10/2023, 4:22 PM

## 2023-05-10 NOTE — Subjective & Objective (Signed)
 Mr. Noxon, a 63 y/o with DM, HTN, HLD, tobacco abuse reports a week of intermittent abdominal pain RLQ with radiation to his chest. He has not had N/V/D,  no active GI symptoms - hemtemesis, change in bowel habit. He has not had previous problems with chest pain. Due to his persistent intermittent discomfort he presents to [facility] for evaluation.  Of not his last CPP visit for chronic disease monitoring was 02/02/23 - he was deemed stable at that time.

## 2023-05-10 NOTE — ED Triage Notes (Signed)
 Pt arrived via POV. C/o abd and chest pain that began 2x days ago. Pain gets worse after eating.   AOx4

## 2023-05-10 NOTE — Assessment & Plan Note (Addendum)
 Patient with atypical chest pain. He has multiple risk factors with DM, HTN, HLD, tobacco use, age. Heart RISK score 8. EKG with 1-73mm ST elevation V2, V3, 1mm ST elevation V4, T-wave inversion V4, V5. Initial troponin 4, 456. EDP spoke with Cardiology (Dr. [place]) who recommended ASA, Heparin infusion and transfer to [facility]  Plan Continue Heparin  ASA started  Continue cardiac meds including ACE-I  Add meoprolol 25 mg BID  IVF  MS for uncontrolled pain  NPO  Cardiology to see for change in symptoms or on rounds.

## 2023-05-10 NOTE — Telephone Encounter (Signed)
 [name] called pt to advise of the need to go to the ED. KH

## 2023-05-10 NOTE — Assessment & Plan Note (Signed)
 Last A1C 6.8% Sept '24. Glucose at admission 124.  Plan Hold metformin in acute setting  Ss coverage

## 2023-05-11 ENCOUNTER — Other Ambulatory Visit (HOSPITAL_COMMUNITY): Payer: Self-pay

## 2023-05-11 ENCOUNTER — Encounter (HOSPITAL_COMMUNITY): Payer: Self-pay | Admitting: Cardiovascular Disease

## 2023-05-11 ENCOUNTER — Encounter (HOSPITAL_COMMUNITY)
Admission: EM | Disposition: A | Discharge status: Home / Self Care | Payer: Self-pay | Source: Home / Self Care | Attending: Thoracic Surgery (Cardiothoracic Vascular Surgery)

## 2023-05-11 DIAGNOSIS — I214 Non-ST elevation (NSTEMI) myocardial infarction: Secondary | ICD-10-CM

## 2023-05-11 DIAGNOSIS — F17210 Nicotine dependence, cigarettes, uncomplicated: Secondary | ICD-10-CM

## 2023-05-11 HISTORY — PX: LEFT HEART CATH AND CORONARY ANGIOGRAPHY: CATH118249

## 2023-05-11 LAB — CBG MONITORING, ED
Glucose-Capillary: 98 mg/dL (ref 70–99)
Glucose-Capillary: 93 mg/dL (ref 70–99)

## 2023-05-11 LAB — LIPID PANEL
Cholesterol: 145 mg/dL (ref 0–200)
HDL: 24 mg/dL — ABNORMAL LOW (ref >40)
LDL Cholesterol: 104 mg/dL — ABNORMAL HIGH (ref 0–99)
Total CHOL/HDL Ratio: 6 {ratio}
Triglycerides: 85 mg/dL (ref <150)
VLDL: 17 mg/dL (ref 0–40)

## 2023-05-11 LAB — CBC
HCT: 40.9 % (ref 39.0–52.0)
Hemoglobin: 13.7 g/dL (ref 13.0–17.0)
MCH: 31.2 pg (ref 26.0–34.0)
MCHC: 33.5 g/dL (ref 30.0–36.0)
MCV: 93.2 fL (ref 80.0–100.0)
Platelets: 182 10*3/uL (ref 150–400)
RBC: 4.39 MIL/uL (ref 4.22–5.81)
RDW: 12.9 % (ref 11.5–15.5)
WBC: 7.5 10*3/uL (ref 4.0–10.5)
nRBC: 0 % (ref 0.0–0.2)

## 2023-05-11 LAB — GLUCOSE, CAPILLARY
Glucose-Capillary: 93 mg/dL (ref 70–99)
Glucose-Capillary: 141 mg/dL — ABNORMAL HIGH (ref 70–99)
Glucose-Capillary: 233 mg/dL — ABNORMAL HIGH (ref 70–99)

## 2023-05-11 LAB — HEPARIN LEVEL (UNFRACTIONATED)
Heparin Unfractionated: 0.31 [IU]/mL (ref 0.30–0.70)
Heparin Unfractionated: 1.07 [IU]/mL — ABNORMAL HIGH (ref 0.30–0.70)

## 2023-05-11 SURGERY — LEFT HEART CATH AND CORONARY ANGIOGRAPHY
Anesthesia: LOCAL

## 2023-05-11 MED ORDER — VERAPAMIL HCL 2.5 MG/ML IV SOLN
INTRAVENOUS | Status: DC | PRN
  Administered 2023-05-11: 10 mL via INTRA_ARTERIAL

## 2023-05-11 MED ORDER — LABETALOL HCL 5 MG/ML IV SOLN: 10.0000 mg | INTRAVENOUS | Status: AC | PRN

## 2023-05-11 MED ORDER — ACETAMINOPHEN 325 MG PO TABS: 650.0000 mg | ORAL_TABLET | Freq: Four times a day (QID) | ORAL | Status: DC | PRN

## 2023-05-11 MED ORDER — SODIUM CHLORIDE 0.9 % WEIGHT BASED INFUSION
3.0000 mL/kg/h | INTRAVENOUS | Status: DC
  Administered 2023-05-11: 3 mL/kg/h via INTRAVENOUS

## 2023-05-11 MED ORDER — ASPIRIN 81 MG PO CHEW
CHEWABLE_TABLET | ORAL | Status: AC
  Administered 2023-05-11: 81 mg via ORAL
  Filled 2023-05-11: qty 1

## 2023-05-11 MED ORDER — MIDAZOLAM HCL 2 MG/2ML IJ SOLN
INTRAMUSCULAR | Status: AC
  Filled 2023-05-11: qty 2

## 2023-05-11 MED ORDER — IOHEXOL 350 MG/ML SOLN
INTRAVENOUS | Status: DC | PRN
  Administered 2023-05-11: 80 mL

## 2023-05-11 MED ORDER — HEPARIN (PORCINE) IN NACL 1000-0.9 UT/500ML-% IV SOLN
INTRAVENOUS | Status: DC | PRN
Start: 2023-05-11 — End: 2023-05-11
  Administered 2023-05-11: 1000 mL

## 2023-05-11 MED ORDER — HEPARIN SODIUM (PORCINE) 1000 UNIT/ML IJ SOLN
INTRAMUSCULAR | Status: DC | PRN
  Administered 2023-05-11: 5000 [IU] via INTRAVENOUS

## 2023-05-11 MED ORDER — FENTANYL CITRATE (PF) 100 MCG/2ML IJ SOLN
INTRAMUSCULAR | Status: AC
Start: 2023-05-11 — End: ?
  Filled 2023-05-11: qty 2

## 2023-05-11 MED ORDER — ASPIRIN 81 MG PO TBEC
81.0000 mg | DELAYED_RELEASE_TABLET | Freq: Every day | ORAL | Status: DC
  Administered 2023-05-12 – 2023-05-15 (×4): 81 mg via ORAL
  Filled 2023-05-11 (×4): qty 1

## 2023-05-11 MED ORDER — SODIUM CHLORIDE 0.9 % IV SOLN: 250.0000 mL | INTRAVENOUS | Status: AC | PRN

## 2023-05-11 MED ORDER — LIDOCAINE HCL (PF) 1 % IJ SOLN
INTRAMUSCULAR | Status: DC | PRN
  Administered 2023-05-11: 2 mL

## 2023-05-11 MED ORDER — MIDAZOLAM HCL 2 MG/2ML IJ SOLN
INTRAMUSCULAR | Status: DC | PRN
Start: 2023-05-11 — End: 2023-05-11
  Administered 2023-05-11 (×2): 1 mg via INTRAVENOUS

## 2023-05-11 MED ORDER — SODIUM CHLORIDE 0.9 % WEIGHT BASED INFUSION: 1.0000 mL/kg/h | INTRAVENOUS | Status: DC

## 2023-05-11 MED ORDER — FENTANYL CITRATE (PF) 100 MCG/2ML IJ SOLN
INTRAMUSCULAR | Status: DC | PRN
  Administered 2023-05-11 (×2): 25 ug via INTRAVENOUS

## 2023-05-11 MED ORDER — ASPIRIN 81 MG PO CHEW: 81.0000 mg | CHEWABLE_TABLET | ORAL | Status: AC

## 2023-05-11 MED ORDER — SODIUM CHLORIDE 0.9% FLUSH
3.0000 mL | Freq: Two times a day (BID) | INTRAVENOUS | Status: DC
  Administered 2023-05-12 – 2023-05-14 (×5): 3 mL via INTRAVENOUS

## 2023-05-11 MED ORDER — ATORVASTATIN CALCIUM 80 MG PO TABS
80.0000 mg | ORAL_TABLET | Freq: Every day | ORAL | Status: DC
  Administered 2023-05-11 – 2023-05-19 (×9): 80 mg via ORAL
  Filled 2023-05-11 (×8): qty 1
  Filled 2023-05-11: qty 2

## 2023-05-11 MED ORDER — SODIUM CHLORIDE 0.9% FLUSH: 3.0000 mL | INTRAVENOUS | Status: DC | PRN

## 2023-05-11 MED ORDER — HEPARIN SODIUM (PORCINE) 1000 UNIT/ML IJ SOLN
INTRAMUSCULAR | Status: AC
  Filled 2023-05-11: qty 10

## 2023-05-11 MED ORDER — HEPARIN (PORCINE) 25000 UT/250ML-% IV SOLN
1200.0000 [IU]/h | INTRAVENOUS | Status: DC
  Administered 2023-05-11: 1050 [IU]/h via INTRAVENOUS
  Administered 2023-05-12 – 2023-05-14 (×3): 1200 [IU]/h via INTRAVENOUS
  Filled 2023-05-11 (×4): qty 250

## 2023-05-11 MED ORDER — VERAPAMIL HCL 2.5 MG/ML IV SOLN
INTRAVENOUS | Status: AC
  Filled 2023-05-11: qty 2

## 2023-05-11 MED ORDER — HYDRALAZINE HCL 20 MG/ML IJ SOLN: 10.0000 mg | INTRAMUSCULAR | Status: AC | PRN

## 2023-05-11 MED ORDER — LIDOCAINE HCL (PF) 1 % IJ SOLN
INTRAMUSCULAR | Status: AC
  Filled 2023-05-11: qty 30

## 2023-05-11 SURGICAL SUPPLY — 7 items
CATH 5FR JL3.5 JR4 ANG PIG MP (CATHETERS) IMPLANT
GLIDESHEATH SLEND SS 6F .021 (SHEATH) IMPLANT
GUIDEWIRE INQWIRE 1.5J.035X260 (WIRE) IMPLANT
INQWIRE 1.5J .035X260CM (WIRE) ×1 IMPLANT
PACK CARDIAC CATHETERIZATION (CUSTOM PROCEDURE TRAY) ×1 IMPLANT
SET ATX-X65L (MISCELLANEOUS) IMPLANT
WIRE HI TORQ VERSACORE-J 145CM (WIRE) IMPLANT

## 2023-05-11 NOTE — ED Notes (Signed)
 Assumed care of patient. Patient resting comfortably in bed with no signs of acute distress noted. Waiting on bed at [facility]. No complaints at this time.

## 2023-05-11 NOTE — Progress Notes (Signed)
 PROGRESS NOTE  [name]  ZOX:[id number] DOB: 1960-08-18 DOA: 05/10/2023 PCP: Donell Beers, FNP   Brief Narrative: Patient is a 63 year old male with history of diabetes type 2, hypertension, hyperlipidemia, tobacco use who presented from home with complaint of abdominal pain radiating to the chest.  He reported pain starting in the right lower quadrant and but it radiates to right side of his chest.  On presentation, troponin was elevated in the range of 3000.  EKG showed T wave inversions in V4, V5.  Case was discussed with cardiology.  Given aspirin, started on IV heparin.  Plan is to transfer to [facility] for ischemic workup.  Assessment & Plan:  Principal Problem:   NSTEMI (non-ST elevated myocardial infarction) (HCC) Active Problems:   PAD (peripheral artery disease) (HCC)   Type 2 diabetes mellitus with hyperglycemia (HCC)   Primary hypertension   Dyslipidemia, goal LDL below 70   NSTEMI: Presented with abdominal pain radiating to chest.  Has significant risk factors: Diabetes, hypertension, hyperlipidemia, tobacco use.  Elevated troponin on presentation with EKG changes.  Case was discussed with cardiology.  Cardiology will follow.  Plan is to transfer him to [facility], plan for cardiac cath Started on aspirin, metoprolol. Denied any chest pain during my evaluation this morning.  History of hypertension: Takes ACE inhibitor at home.  Added beta-blocker here  Hyperlipidemia: Lipid panel showed LDL of 104.  Continue Lipitor  Type 2 diabetes: Takes metformin at home.  Last A1c of 6.8% as per September 24.  Currently on sliding scale.  Monitor blood sugars  History of peripheral artery disease: Follows-up with outpatient vascular surgery.  No signs of symptoms of ischemic disease currently.  Smoker: Smokes 2 cigarettes a day.  Drinks a glass of wine daily.  Counseled for smoking cessation        DVT prophylaxis:IV heparin.     Code Status: Full  Code  Family Communication: None at the bedside  Patient status: Inpatient  Patient is from : Home  Anticipated discharge to: Home  Estimated DC date: After full cardiac workup   Consultants: Cardiology  Procedures: None  Antimicrobials:  Anti-infectives (From admission, onward)    None       Subjective: Patient seen and examined at bedside today.  Hemodynamically stable.  Comfortable.  Denies any chest pain or abdominal pain during my evaluation.  Appears comfortable.  We discussed about possible transfer to [facility] for cardiac cath.  Patient agreeable  Objective: Vitals:   05/11/23 0200 05/11/23 0319 05/11/23 0549 05/11/23 0700  BP: 106/68 122/73 110/69 115/65  Pulse: 71 72 73 66  Resp: 15 16 16 13   Temp:  98.3 F (36.8 C) 97.7 F (36.5 C)   TempSrc:  Oral Oral   SpO2: 98% 97% 98% 96%  Weight:      Height:        Intake/Output Summary (Last 24 hours) at 05/11/2023 0832 Last data filed at 05/11/2023 0408 Gross per 24 hour  Intake 84.71 ml  Output 750 ml  Net -665.29 ml   Filed Weights   05/10/23 1143  Weight: 72.6 kg    Examination:  General exam: Overall comfortable, not in distress HEENT: PERRL Respiratory system:  no wheezes or crackles  Cardiovascular system: S1 & S2 heard, RRR.  Gastrointestinal system: Abdomen is nondistended, soft and nontender. Central nervous system: Alert and oriented Extremities: No edema, no clubbing ,no cyanosis Skin: No rashes, no ulcers,no icterus     Data Reviewed: I  have personally reviewed following labs and imaging studies  CBC: Recent Labs  Lab 05/10/23 1153 05/11/23 0551  WBC 7.0 7.5  HGB 15.9 13.7  HCT 47.3 40.9  MCV 93.8 93.2  PLT 222 182   Basic Metabolic Panel: Recent Labs  Lab 05/10/23 1153  NA 137  K 4.0  CL 103  CO2 26  GLUCOSE 124*  BUN 16  CREATININE 0.69  CALCIUM 9.7     No results found for this or any previous visit (from the past 240 hours).   Radiology Studies: DG Chest  Port 1 View Result Date: 05/10/2023 CLINICAL DATA:  Chest pain. EXAM: PORTABLE CHEST 1 VIEW COMPARISON:  February 03, 2013. FINDINGS: The heart size and mediastinal contours are within normal limits. Both lungs are clear. The visualized skeletal structures are unremarkable. IMPRESSION: No active disease. Electronically Signed   By: Lupita Raider M.D.   On: 05/10/2023 16:37   CT ABDOMEN PELVIS WO CONTRAST Result Date: 05/10/2023 CLINICAL DATA:  Abdominal pain. EXAM: CT ABDOMEN AND PELVIS WITHOUT CONTRAST TECHNIQUE: Multidetector CT imaging of the abdomen and pelvis was performed following the standard protocol without IV contrast. RADIATION DOSE REDUCTION: This exam was performed according to the departmental dose-optimization program which includes automated exposure control, adjustment of the mA and/or kV according to patient size and/or use of iterative reconstruction technique. COMPARISON:  CT abdomen pelvis dated 09/06/2022. FINDINGS: Evaluation of this exam is limited in the absence of intravenous contrast. Lower chest: Bibasilar subpleural atelectasis/scarring. No intra-abdominal free air or free fluid. Hepatobiliary: The liver is unremarkable. No biliary dilatation. The gallbladder is unremarkable. Pancreas: Unremarkable. No pancreatic ductal dilatation or surrounding inflammatory changes. Spleen: Normal in size without focal abnormality. Adrenals/Urinary Tract: The adrenal glands unremarkable. There is no hydronephrosis on limited physis on either side. The visualized ureters and urinary bladder appear unremarkable. Stomach/Bowel: There is sigmoid diverticulosis and scattered colonic diverticula. There is moderate stool throughout the colon. No bowel obstruction or active inflammation. The appendix is normal. Vascular/Lymphatic: Mild aortoiliac atherosclerotic disease. The IVC is unremarkable. No portal venous gas. There is no adenopathy. Reproductive: The prostate and seminal vesicles are grossly  unremarkable. Other: Small fat containing left inguinal hernia. Musculoskeletal: Osteopenia with degenerative changes of the spine. Multilevel disc desiccation and vacuum phenomena. No acute osseous pathology. IMPRESSION: 1. No acute intra-abdominal or pelvic pathology. 2. Colonic diverticulosis.  No bowel obstruction. Normal appendix. 3.  Aortic Atherosclerosis (ICD10-I70.0). Electronically Signed   By: Elgie Collard M.D.   On: 05/10/2023 15:28    Scheduled Meds:  aspirin EC  81 mg Oral Daily   atorvastatin  40 mg Oral Daily   gabapentin  300 mg Oral TID   insulin aspart  0-15 Units Subcutaneous Q4H   lisinopril  2.5 mg Oral Daily   metoprolol tartrate  25 mg Oral BID   Continuous Infusions:  heparin 1,050 Units/hr (05/10/23 2310)     LOS: 1 day   Burnadette Pop, MD [facility] P2/20/2025, 8:32 AM

## 2023-05-11 NOTE — Plan of Care (Signed)
  Problem: Activity: Goal: Risk for activity intolerance will decrease Outcome: Progressing   Problem: Nutrition: Goal: Adequate nutrition will be maintained Outcome: Progressing   Problem: Cardiovascular: Goal: Ability to achieve and maintain adequate cardiovascular perfusion will improve Outcome: Progressing Goal: Vascular access site(s) Level 0-1 will be maintained Outcome: Progressing

## 2023-05-11 NOTE — Interval H&P Note (Signed)
 History and Physical Interval Note:  05/11/2023 2:07 PM  [name]  has presented today for surgery, with the diagnosis of NSTEMI.  The various methods of treatment have been discussed with the patient and family. After consideration of risks, benefits and other options for treatment, the patient has consented to  Procedure(s): LEFT HEART CATH AND CORONARY ANGIOGRAPHY (N/A) as a surgical intervention.  The patient's history has been reviewed, patient examined, no change in status, stable for surgery.  I have reviewed the patient's chart and labs.  Questions were answered to the patient's satisfaction.     Tonny Bollman

## 2023-05-11 NOTE — H&P (View-Only) (Signed)
 Cardiology Consultation   Patient ID: Prentice Sackrider MRN: [id number]; DOB: 10/16/1960  Admit date: 05/10/2023 Date of Consult: 05/11/2023  PCP:  Donell Beers, FNP   [facility] Providers Cardiologist:  New  Click here to update MD or APP on Care Team, Refresh:1}     Patient Profile:   [name] is a 63 y.o. male with a hx of DM, HLD with hypertriglyceridemia, HTN, tobacco abuse, normal ABIs 05/2022 (saw [facility] not requiring further evaluation), arthritis who is being seen 05/11/2023 for the evaluation of chest pain/NSTEMI at the request of Dr. Renford Dills.  History of Present Illness:   [name] has no formal cardiac history. He saw our team in 2012 for atypical CP and had nuc result that is not finalized in the chart but reportedly normal, and echo showed EF 55% at that time. He presented to the hospital with mild RUQ pain and right sided chest pain that felt like heartburn. He had dinner as usual on Sunday. On Monday night around 2am he was already awake when he felt RUQ abdominal pain like something was inside as well as right sided chest pain that felt like heartburn. He drank a glass of milk and symptoms eased off within 30 minutes. He felt okay Tuesday. Yesterday he had another similar episode that happened when he was laying down not doing anything at all. It wasn't particularly severe but given the recurrence he wanted to get checked out so came to the ER. Symptoms again had lasted 30 minutes and resolved without intervention. He had not had pain like this in the past before Monday. He had some radiation to his right arm yesterday otherwise no palpitations, chills, nausea, vomiting, SOB, orthopnea, edema.  Labs showed hsTroponin [phone]. UA clear, CBC OK, TBili 1.6 and glucose 124 otherwise CMET normal. LDL 104, trig 85. CT a/p with no acute abnormality, + diverticulosis and aortc atherosclerosis. CXR NAD. EKG showed NSR 85bpm with anterior Q waves V1-V4 (previously present  V1-V3) with more pronounced anterior ST upsloping V2-V3 with biphasic STTW changes V4-V5, nonspecific STTW changes otherwise. Case was discussed with Dr. [place] overnight, did not meet STEMI criteria but recommended for eval and probable cath. F/u EKG this AM appears similar. He remains pain free at this time. No recent prolonged travel. Not tachycardic or hypoxic. Brother had MI, otherwise DM runs in the family.  He also reports sometimes he feels his legs have a pulling sensation in them or are cold. Sometimes he feels as if there are balls on the bottom of his feet. Question whether this represents diabetic neuropathy. He saw Dr. Randie Heinz in 05/2022 with normal ABIs, felt reassuring against PAD.  Patient speaks some English but we did use the interpreter phone line with Donald Pore [phone].   Past Medical History:  Diagnosis Date   Arthritis of left knee    Cervical stenosis of spine    Diabetes mellitus without complication (HCC)    Hyperlipidemia LDL goal <70    Primary hypertension 02/07/2022   Tobacco use     History reviewed. No pertinent surgical history.   Home Medications:  Prior to Admission medications   Medication Sig Start Date End Date Taking? Authorizing Provider  aspirin EC 81 MG tablet Take 162 mg by mouth daily. Swallow whole.   Yes [provider]  atorvastatin (LIPITOR) 40 MG tablet Take 1 tablet (40 mg total) by mouth daily. 05/05/23  Yes Paseda, Baird Kay, FNP  gabapentin (NEURONTIN) 300 MG capsule Take  1 capsule (300 mg total) by mouth 3 (three) times daily. Patient taking differently: Take 300 mg by mouth 3 (three) times daily. Take 300 mg by mouth two to three times a day 05/05/23 08/03/23 Yes Paseda, Baird Kay, FNP  metFORMIN (GLUCOPHAGE) 1000 MG tablet Take 1 tablet (1,000 mg total) by mouth 2 (two) times daily with a meal. 05/05/23  Yes Paseda, Baird Kay, FNP  ibuprofen (ADVIL) 600 MG tablet Take 1 tablet (600 mg total) by mouth every 8 (eight) hours as  needed. Patient not taking: Reported on 05/10/2023 03/26/23   Donell Beers, FNP  lisinopril (ZESTRIL) 2.5 MG tablet Take 1 tablet (2.5 mg total) by mouth daily. Patient not taking: Reported on 05/10/2023 05/05/23   Donell Beers, FNP    Inpatient Medications: Scheduled Meds:  aspirin  81 mg Oral Pre-Cath   [START ON 05/12/2023] aspirin EC  81 mg Oral Daily   atorvastatin  80 mg Oral Daily   gabapentin  300 mg Oral TID   insulin aspart  0-15 Units Subcutaneous Q4H   metoprolol tartrate  25 mg Oral BID   Continuous Infusions:  sodium chloride     Followed by   sodium chloride     heparin 1,050 Units/hr (05/10/23 2310)   PRN Meds: ibuprofen, nitroGLYCERIN, ondansetron (ZOFRAN) IV  Allergies:   No Known Allergies  Social History:   Social History   Socioeconomic History   Marital status: Single    Spouse name: Not on file   Number of children: 9   Years of education: Not on file   Highest education level: Not on file  Occupational History   Not on file  Tobacco Use   Smoking status: Every Day    Current packs/day: 0.25    Types: Cigarettes   Smokeless tobacco: Current  Substance and Sexual Activity   Alcohol use: Yes    Alcohol/week: 2.0 standard drinks of alcohol    Types: 2 Cans of beer per week    Comment: occas   Drug use: Not Currently    Comment: ued to smoke weed   Sexual activity: Not on file  Other Topics Concern   Not on file  Social History Narrative   Lives alone with a room mate .    Social Drivers of Corporate investment banker Strain: Not on file  Food Insecurity: Not on file  Transportation Needs: Not on file  Physical Activity: Not on file  Stress: Not on file  Social Connections: Not on file  Intimate Partner Violence: Not on file    Family History:   Family History  Problem Relation Age of Onset   Diabetes Father    Colon cancer Neg Hx    Prostate cancer Neg Hx      ROS:  Please see the history of present illness.  All  other ROS reviewed and negative.     Physical Exam/Data:   Vitals:   05/11/23 0900 05/11/23 1000 05/11/23 1029 05/11/23 1030  BP: 122/64 119/72 119/72   Pulse: 75 68 77 76  Resp: 17 13  11   Temp:      TempSrc:      SpO2: 98% 96%  99%  Weight:      Height:        Intake/Output Summary (Last 24 hours) at 05/11/2023 1109 Last data filed at 05/11/2023 0408 Gross per 24 hour  Intake 84.71 ml  Output 750 ml  Net -665.29 ml  05/10/2023   11:43 AM 12/16/2022    8:21 AM 09/14/2022    3:46 PM  Last 3 Weights  Weight (lbs) 160 lb 150 lb 9.6 oz 147 lb 12.8 oz  Weight (kg) 72.576 kg 68.312 kg 67.042 kg     Body mass index is 28.34 kg/m.  General: Well developed, well nourished, in no acute distress. Head: Normocephalic, atraumatic, sclera non-icteric, no xanthomas, nares are without discharge. Neck: Negative for carotid bruits. JVP not elevated. Lungs: Clear bilaterally to auscultation without wheezes, rales, or rhonchi. Breathing is unlabored. Heart: RRR S1 S2 without murmurs, rubs, or gallops.  Abdomen: Soft, non-tender, non-distended with normoactive bowel sounds. No rebound/guarding. Extremities: No clubbing or cyanosis. No edema. Distal pedal pulses are 2+ and equal bilaterally. Neuro: Alert and oriented X 3. Moves all extremities spontaneously. Psych:  Responds to questions appropriately with a normal affect.   EKG:  The EKG was personally reviewed and demonstrates: NSR 85bpm with anterior Q waves V1-V4 (previously present V1-V3) with more pronounced anterior ST upsloping V2-V3 with biphasic STTW changes V4-V5, nonspecific STTW changes otherwise. Concerning for recent MI.  Telemetry:  Telemetry was personally reviewed and demonstrates:  NSR  Relevant CV Studies: N/A  Laboratory Data:  High Sensitivity Troponin:   Recent Labs  Lab 05/10/23 1423 05/10/23 1727  TROPONINIHS 3,456* 3,333*     Chemistry Recent Labs  Lab 05/10/23 1153  NA 137  K 4.0  CL 103  CO2  26  GLUCOSE 124*  BUN 16  CREATININE 0.69  CALCIUM 9.7  GFRNONAA >60  ANIONGAP 8    Recent Labs  Lab 05/10/23 1153  PROT 8.0  ALBUMIN 4.3  AST 29  ALT 20  ALKPHOS 93  BILITOT 1.6*   Lipids  Recent Labs  Lab 05/11/23 0551  CHOL 145  TRIG 85  HDL 24*  LDLCALC 104*  CHOLHDL 6.0    Hematology Recent Labs  Lab 05/10/23 1153 05/11/23 0551  WBC 7.0 7.5  RBC 5.04 4.39  HGB 15.9 13.7  HCT 47.3 40.9  MCV 93.8 93.2  MCH 31.5 31.2  MCHC 33.6 33.5  RDW 12.8 12.9  PLT 222 182   Thyroid No results for input(s): "TSH", "FREET4" in the last 168 hours.  BNPNo results for input(s): "BNP", "PROBNP" in the last 168 hours.  DDimer No results for input(s): "DDIMER" in the last 168 hours.   Radiology/Studies:  [facility] Chest Port 1 View Result Date: 05/10/2023 CLINICAL DATA:  Chest pain. EXAM: PORTABLE CHEST 1 VIEW COMPARISON:  February 03, 2013. FINDINGS: The heart size and mediastinal contours are within normal limits. Both lungs are clear. The visualized skeletal structures are unremarkable. IMPRESSION: No active disease. Electronically Signed   By: Lupita Raider M.D.   On: 05/10/2023 16:37   CT ABDOMEN PELVIS WO CONTRAST Result Date: 05/10/2023 CLINICAL DATA:  Abdominal pain. EXAM: CT ABDOMEN AND PELVIS WITHOUT CONTRAST TECHNIQUE: Multidetector CT imaging of the abdomen and pelvis was performed following the standard protocol without IV contrast. RADIATION DOSE REDUCTION: This exam was performed according to the departmental dose-optimization program which includes automated exposure control, adjustment of the mA and/or kV according to patient size and/or use of iterative reconstruction technique. COMPARISON:  CT abdomen pelvis dated 09/06/2022. FINDINGS: Evaluation of this exam is limited in the absence of intravenous contrast. Lower chest: Bibasilar subpleural atelectasis/scarring. No intra-abdominal free air or free fluid. Hepatobiliary: The liver is unremarkable. No biliary  dilatation. The gallbladder is unremarkable. Pancreas: Unremarkable. No pancreatic ductal dilatation  or surrounding inflammatory changes. Spleen: Normal in size without focal abnormality. Adrenals/Urinary Tract: The adrenal glands unremarkable. There is no hydronephrosis on limited physis on either side. The visualized ureters and urinary bladder appear unremarkable. Stomach/Bowel: There is sigmoid diverticulosis and scattered colonic diverticula. There is moderate stool throughout the colon. No bowel obstruction or active inflammation. The appendix is normal. Vascular/Lymphatic: Mild aortoiliac atherosclerotic disease. The IVC is unremarkable. No portal venous gas. There is no adenopathy. Reproductive: The prostate and seminal vesicles are grossly unremarkable. Other: Small fat containing left inguinal hernia. Musculoskeletal: Osteopenia with degenerative changes of the spine. Multilevel disc desiccation and vacuum phenomena. No acute osseous pathology. IMPRESSION: 1. No acute intra-abdominal or pelvic pathology. 2. Colonic diverticulosis.  No bowel obstruction. Normal appendix. 3.  Aortic Atherosclerosis (ICD10-I70.0). Electronically Signed   By: Elgie Collard M.D.   On: 05/10/2023 15:28     Assessment and Plan:   1. Precordial pain rules in for NSTEMI - EKG concerning for recent MI - got ASA 324mg  x1, to start 81mg  daily today, started on Lopressor 25mg  BID - titrate atorvastatin to 80mg  daily for LDL 104 - If the patient is tolerating statin at time of follow-up appointment, would consider rechecking liver function/lipid panel in 6-8 weeks, refer for PCSK9i if not at goal at that time - continue heparin per pharmacy - 2d echo - recommend transfer to [facility] for cath, d/w Dr. Odis Hollingshead who agrees  Informed Consent   Shared Decision Making/Informed Consent The risks [stroke (1 in 1000), death (1 in 1000), kidney failure [usually temporary] (1 in 500), bleeding (1 in 200), allergic reaction [possibly  serious] (1 in 200)], benefits (diagnostic support and management of coronary artery disease) and alternatives of a cardiac catheterization were discussed in detail with [name] and he is willing to proceed.    2. HLD - statin change as above  3. HTN - Cr normal, SBP low 100s-1teens - since being started on BB, hold lisinopril to allow BP room  4. DM - hold metformin given plan for cath  5. Tobacco abuse - continue to encourage cessation, smokes 2 cigarettes/day  6. Elevated bilirubin - per primary team; intermittently up in past as well  Risk Assessment/Risk Scores:     TIMI Risk Score for Unstable Angina or Non-ST Elevation MI:   The patient's TIMI risk score is 3, which indicates a 13% risk of all cause mortality, new or recurrent myocardial infarction or need for urgent revascularization in the next 14 days.   For questions or updates, please contact [facility] Please consult [email or web address] for contact info under    Signed, Laurann Montana, PA-C  05/11/2023 9:49 AM  ADDENDUM:   Patient seen and examined.  I personally taken a history, examined the patient, reviewed relevant notes,  laboratory data / imaging studies.  I performed a substantive portion of this encounter and formulated the important aspects of the plan.  I agree with the APP's note, impression, and recommendations; however, I have edited the note to reflect changes or salient points.   Resting in bed comfortably. Chest pain onset Monday, 05/08/2023 at approximately 2 AM. The pain is located over the anterior precordium, right greater than sign left Has had at least 2 episodes since Monday, May 08, 2023. Not able to quantify exertional chest pain as he walks with a cane. He started experiencing right arm pain and had a recurrent event yesterday and comes to the hospital for further evaluation.  Currently he is  pain-free. Denies orthopnea, PND, or lower extremity swelling.  PHYSICAL  EXAM: Today's Vitals   05/11/23 1000 05/11/23 1029 05/11/23 1030 05/11/23 1052  BP: 119/72 119/72    Pulse: 68 77 76   Resp: 13  11   Temp:      TempSrc:      SpO2: 96%  99%   Weight:      Height:      PainSc:    0-No pain   Body mass index is 28.34 kg/m.   Net IO Since Admission: -665.29 mL [05/11/23 1109]  Filed Weights   05/10/23 1143  Weight: 72.6 kg    Physical Exam  Constitutional: No distress.  hemodynamically stable  Neck: No JVD present.  Cardiovascular: Normal rate, regular rhythm, S1 normal and S2 normal. Exam reveals no gallop, no S3 and no S4.  No murmur heard. Pulmonary/Chest: Effort normal and breath sounds normal. No stridor. He has no wheezes. He has no rales.  Musculoskeletal:        General: No edema.     Cervical back: Neck supple.  Skin: Skin is warm.    EKG: (personally reviewed by me) 05/10/2023: Sinus rhythm, 85 bpm, consider old anteroseptal infarct with ST-T changes in the anterior leads concerning for possible anterior ischemia.  05/11/2023: Sinus rhythm, 76 bpm, consider anteroseptal infarct with dynamic ST-T changes similar to prior tracing.  Telemetry: (personally reviewed by me) Sinus rhythm   Impression:  NSTEMI Cigarette smoking. Diabetes mellitus type 2. Hypertension Hyperlipidemia  Recommendations:  NSTEMI: Symptoms concerning for cardiac discomfort, since he ambulates with a walker exertional discomfort is hard to ascertain Index event likely Monday, 05/08/2023 High sensitive troponins elevated, peaked at 3456 EKG shows sinus rhythm with dynamic changes as described above. Multiple cardiovascular risk factors including cigarette smoking and diabetes We discussed the role of ischemic workup and given the pretest probability for obstructive disease is high and he rules in for NSTEMI left heart catheterization as requested/recommended.  After discussing the risks, benefits, alternatives to left heart catheterization with  possible intervention patient agrees to proceed forward. I did use a Engineer, structural via [employer] which provided video and audio services interpreter name [place] [phone]. I offered to speak to his next of kin.  Patient states that he is married but separated.  He wanted me to talk to his niece Franco Collet who can be reached at [phone].  Their questions and concerns addressed.  Reviewed the consent with them as well.  Cigarette smoking: Tobacco cessation counseling: Currently smoking 2 cigarettes per day.  He is informed of the dangers of tobacco abuse including stroke, cancer, and MI, as well as benefits of tobacco cessation. He is willing to quit at this time. 3 mins were spent counseling patient cessation techniques. We discussed various methods to help quit smoking, including deciding on a date to quit, joining a support group, pharmacological agents- nicotine gum/patch/lozenges.   Diabetes mellitus type 2: Reemphasized importance of glycemic control.  Hyperlipidemia: At goal LDL for now at least <70 mg/dL continue rosuvastatin 80 mg p.o. daily  Further recommendations to follow as the case evolves.  As part of today's encounter independently reviewed labs from 2/19 and 05/11/2023, utilized audio and visual interpretation services as patient predominately speaks Spanish, spoke to his niece as per his request, independently reviewed EKGs, in addition diagnostic workup in the past, and obtaining informed consent as noted above for left heart catheterization with possible intervention.  [facility] at bedside to transfer the patient  to [facility].   This note was created using a voice recognition software as a result there may be grammatical errors inadvertently enclosed that do not reflect the nature of this encounter. Every attempt is made to correct such errors.   Tessa Lerner, DO, [facility] [place]  [facility]  [address] [place], [place]  [address] Pager: [phone] Office: [phone] 05/11/2023 11:09 AM

## 2023-05-11 NOTE — ED Notes (Signed)
 Pt denied any chest pain, pain globally throughout the night. Pt denies shortness of breath.

## 2023-05-11 NOTE — Progress Notes (Signed)
 ANTICOAGULATION CONSULT NOTE  Pharmacy Consult for heparin Indication: chest pain/ACS  No Known Allergies  Patient Measurements: Height: 5\' 3"  (160 cm) Weight: 72.6 kg (160 lb) IBW/kg (Calculated) : 56.9 Heparin Dosing Weight: 72 kg   Vital Signs: Temp: 97.6 F (36.4 C) (02/20 1600) Temp Source: Oral (02/20 1600) BP: 114/75 (02/20 1800) Pulse Rate: 85 (02/20 1800)  Labs: Recent Labs    05/10/23 1153 05/10/23 1423 05/10/23 1540 05/10/23 1727 05/10/23 2229 05/11/23 0551 05/11/23 1509  HGB 15.9  --   --   --   --  13.7  --   HCT 47.3  --   --   --   --  40.9  --   PLT 222  --   --   --   --  182  --   APTT  --   --  26  --   --   --   --   LABPROT  --   --  13.2  --   --   --   --   INR  --   --  1.0  --   --   --   --   HEPARINUNFRC  --   --   --   --  0.14* 0.31 1.07*  CREATININE 0.69  --   --   --   --   --   --   TROPONINIHS  --  3,456*  --  3,333*  --   --   --     Estimated Creatinine Clearance: 83.4 mL/min (by C-G formula based on SCr of 0.69 mg/dL).  Medical History: Past Medical History:  Diagnosis Date   Arthritis of left knee    Cervical stenosis of spine    Diabetes mellitus without complication (HCC)    Hyperlipidemia LDL goal <70    Primary hypertension 02/07/2022   Tobacco use     Medications:  See MAR  Assessment: 63 yo male presented [facility] ED [facility]/19 with episodic abdominal and radiating chest pain with elevated troponins and EKG changes transferred to [facility] for LHC.  Not on anticoagulation prior to admission.  Pharmacy consulted for heparin dosing.  Now s/p cath revealing multi-vessel CAD, TCTS consulted for CABG consideration.  Plan to restart heparin 2h post TR band removal.  Previously therapeutic pre-cath on 1050 units/hr (HL 0.31).  Received 6000 units in cath ~1400.    TR band off at 1830. Heparin to resume at 8:30 pm tonight.   Goal of Therapy:  Heparin level 0.3-0.7 units/ml Monitor platelets by anticoagulation protocol: Yes    Plan:  Resume heparin 1050 units/hr Heparin level in AM  Daily CBC, anti-Xa level Monitor for s/sx of bleeding  [name], PharmD, BCIDP, AAHIVP, CPP Infectious Disease Pharmacist 05/11/2023 6:35 PM

## 2023-05-11 NOTE — Progress Notes (Signed)
 PHARMACY - ANTICOAGULATION CONSULT NOTE  Pharmacy Consult for Heparin  Indication:  NSTEMI  No Known Allergies  Patient Measurements: Height: 5\' 3"  (160 cm) Weight: 72.6 kg (160 lb) IBW/kg (Calculated) : 56.9 Heparin Dosing Weight: 72 kg  Vital Signs: Temp: 97.7 F (36.5 C) (02/20 0549) Temp Source: Oral (02/20 0549) BP: 122/64 (02/20 0900) Pulse Rate: 75 (02/20 0900)  Labs: Recent Labs    05/10/23 1153 05/10/23 1423 05/10/23 1540 05/10/23 1727 05/10/23 2229 05/11/23 0551  HGB 15.9  --   --   --   --  13.7  HCT 47.3  --   --   --   --  40.9  PLT 222  --   --   --   --  182  APTT  --   --  26  --   --   --   LABPROT  --   --  13.2  --   --   --   INR  --   --  1.0  --   --   --   HEPARINUNFRC  --   --   --   --  0.14* 0.31  CREATININE 0.69  --   --   --   --   --   TROPONINIHS  --  3,456*  --  3,333*  --   --     Estimated Creatinine Clearance: 83.4 mL/min (by C-G formula based on SCr of 0.69 mg/dL).   Medical History: Past Medical History:  Diagnosis Date   Arthritis of left knee    Cervical stenosis of spine    Diabetes mellitus without complication (HCC)    Hyperlipidemia LDL goal <70    Primary hypertension 02/07/2022   Tobacco use     Medications:   sodium chloride     Followed by   sodium chloride     heparin 1,050 Units/hr (05/10/23 2310)     Assessment: 17 yoM presented to ED on 2/19 with abdominal pain and found to have abnormal EKG and troponin elevated at 3456.  Pharmacy is consulted to dose heparin IV. No prior to admission anticoagulation noted.    Today, 05/11/2023: Heparin level 0.31, therapeutic on heparin 1050 units/hr CBC:  Hgb and Plt WNL No bleeding or complications reported.    Goal of Therapy:  Heparin level 0.3-0.7 units/ml Monitor platelets by anticoagulation protocol: Yes   Plan:  Continue heparin IV infusion at 850 units/hr Confirmatory heparin level 6 hours Daily heparin level and CBC Noted plan for transfer to  [facility] PharmD, BCPS [facility] main pharmacy [phone] 05/11/2023 9:58 AM

## 2023-05-11 NOTE — Progress Notes (Incomplete)
ANTICOAGULATION CONSULT NOTE  Pharmacy Consult for heparin Indication: chest pain/ACS  No Known Allergies  Patient Measurements: Height: 5\' 3"  (160 cm) Weight: 72.6 kg (160 lb) IBW/kg (Calculated) : 56.9 Heparin Dosing Weight: 72 kg   Vital Signs: Temp: 97.7 F (36.5 C) (02/20 0549) Temp Source: Oral (02/20 0549) BP: 105/74 (02/20 1433) Pulse Rate: 75 (02/20 1433)  Labs: Recent Labs    05/10/23 1153 05/10/23 1423 05/10/23 1540 05/10/23 1727 05/10/23 2229 05/11/23 0551  HGB 15.9  --   --   --   --  13.7  HCT 47.3  --   --   --   --  40.9  PLT 222  --   --   --   --  182  APTT  --   --  26  --   --   --   LABPROT  --   --  13.2  --   --   --   INR  --   --  1.0  --   --   --   HEPARINUNFRC  --   --   --   --  0.14* 0.31  CREATININE 0.69  --   --   --   --   --   TROPONINIHS  --  3,456*  --  3,333*  --   --     Estimated Creatinine Clearance: 83.4 mL/min (by C-G formula based on SCr of 0.69 mg/dL).  Medical History: Past Medical History:  Diagnosis Date   Arthritis of left knee    Cervical stenosis of spine    Diabetes mellitus without complication (HCC)    Hyperlipidemia LDL goal <70    Primary hypertension 02/07/2022   Tobacco use     Medications:  See MAR  Assessment: 63 yo male presented to [facility] ED 2/19 with episodic abdominal and radiating chest pain with elevated troponins and EKG changes transferred to [facility] for LHC.  Not on anticoagulation prior to admission.  Pharmacy consulted for heparin dosing.  Now s/p cath revealing multi-vessel CAD, TCTS consulted for CABG consideration.  Plan to restart heparin 2h post TR band removal.  Previously therapeutic pre-cath on 1050 units/hr (HL 0.31).  Received 6000 units in cath ~1400.    Goal of Therapy:  Heparin level 0.3-0.7 units/ml Monitor platelets by anticoagulation protocol: Yes   Plan:  START heparin infusion 1050 units/hr 6 hour anti-Xa level  Daily CBC, anti-Xa level Monitor for s/sx of  bleeding  Trixie Rude, PharmD Clinical Pharmacist 05/11/2023  2:57 PM

## 2023-05-11 NOTE — Progress Notes (Signed)
 Pt arrived to cath lab holding bay 07 via [facility] transport. Pt A&Ox4, reporting no pain. VSS. Heparin gtt infusing at 1050/hr. Consents signed. ASA 81mg  given and NS infusion started at ordered rate. 2 PIVs in place. Pt reports no questions at this time.

## 2023-05-11 NOTE — Consult Note (Addendum)
 Cardiology Consultation   Patient ID: [name] MRN: [id number]; DOB: 10/16/1960  Admit date: 05/10/2023 Date of Consult: 05/11/2023  PCP:  Donell Beers, FNP   [facility] Providers Cardiologist:  New  Click here to update MD or APP on Care Team, Refresh:1}     Patient Profile:   [name] is a 63 y.o. male with a hx of DM, HLD with hypertriglyceridemia, HTN, tobacco abuse, normal ABIs 05/2022 (saw [facility] not requiring further evaluation), arthritis who is being seen 05/11/2023 for the evaluation of chest pain/NSTEMI at the request of Dr. Renford Dills.  History of Present Illness:   [name] has no formal cardiac history. He saw our team in 2012 for atypical CP and had nuc result that is not finalized in the chart but reportedly normal, and echo showed EF 55% at that time. He presented to the hospital with mild RUQ pain and right sided chest pain that felt like heartburn. He had dinner as usual on Sunday. On Monday night around 2am he was already awake when he felt RUQ abdominal pain like something was inside as well as right sided chest pain that felt like heartburn. He drank a glass of milk and symptoms eased off within 30 minutes. He felt okay Tuesday. Yesterday he had another similar episode that happened when he was laying down not doing anything at all. It wasn't particularly severe but given the recurrence he wanted to get checked out so came to the ER. Symptoms again had lasted 30 minutes and resolved without intervention. He had not had pain like this in the past before Monday. He had some radiation to his right arm yesterday otherwise no palpitations, chills, nausea, vomiting, SOB, orthopnea, edema.  Labs showed hsTroponin [phone]. UA clear, CBC OK, TBili 1.6 and glucose 124 otherwise CMET normal. LDL 104, trig 85. CT a/p with no acute abnormality, + diverticulosis and aortc atherosclerosis. CXR NAD. EKG showed NSR 85bpm with anterior Q waves V1-V4 (previously present  V1-V3) with more pronounced anterior ST upsloping V2-V3 with biphasic STTW changes V4-V5, nonspecific STTW changes otherwise. Case was discussed with Dr. [place] overnight, did not meet STEMI criteria but recommended for eval and probable cath. F/u EKG this AM appears similar. He remains pain free at this time. No recent prolonged travel. Not tachycardic or hypoxic. Brother had MI, otherwise DM runs in the family.  He also reports sometimes he feels his legs have a pulling sensation in them or are cold. Sometimes he feels as if there are balls on the bottom of his feet. Question whether this represents diabetic neuropathy. He saw Dr. Randie Heinz in 05/2022 with normal ABIs, felt reassuring against PAD.  Patient speaks some English but we did use the interpreter phone line with Donald Pore [phone].   Past Medical History:  Diagnosis Date   Arthritis of left knee    Cervical stenosis of spine    Diabetes mellitus without complication (HCC)    Hyperlipidemia LDL goal <70    Primary hypertension 02/07/2022   Tobacco use     History reviewed. No pertinent surgical history.   Home Medications:  Prior to Admission medications   Medication Sig Start Date End Date Taking? Authorizing Provider  aspirin EC 81 MG tablet Take 162 mg by mouth daily. Swallow whole.   Yes [provider]  atorvastatin (LIPITOR) 40 MG tablet Take 1 tablet (40 mg total) by mouth daily. 05/05/23  Yes Paseda, Baird Kay, FNP  gabapentin (NEURONTIN) 300 MG capsule Take  1 capsule (300 mg total) by mouth 3 (three) times daily. Patient taking differently: Take 300 mg by mouth 3 (three) times daily. Take 300 mg by mouth two to three times a day 05/05/23 08/03/23 Yes Paseda, Baird Kay, FNP  metFORMIN (GLUCOPHAGE) 1000 MG tablet Take 1 tablet (1,000 mg total) by mouth 2 (two) times daily with a meal. 05/05/23  Yes Paseda, Baird Kay, FNP  ibuprofen (ADVIL) 600 MG tablet Take 1 tablet (600 mg total) by mouth every 8 (eight) hours as  needed. Patient not taking: Reported on 05/10/2023 03/26/23   Donell Beers, FNP  lisinopril (ZESTRIL) 2.5 MG tablet Take 1 tablet (2.5 mg total) by mouth daily. Patient not taking: Reported on 05/10/2023 05/05/23   Donell Beers, FNP    Inpatient Medications: Scheduled Meds:  aspirin  81 mg Oral Pre-Cath   [START ON 05/12/2023] aspirin EC  81 mg Oral Daily   atorvastatin  80 mg Oral Daily   gabapentin  300 mg Oral TID   insulin aspart  0-15 Units Subcutaneous Q4H   metoprolol tartrate  25 mg Oral BID   Continuous Infusions:  sodium chloride     Followed by   sodium chloride     heparin 1,050 Units/hr (05/10/23 2310)   PRN Meds: ibuprofen, nitroGLYCERIN, ondansetron (ZOFRAN) IV  Allergies:   No Known Allergies  Social History:   Social History   Socioeconomic History   Marital status: Single    Spouse name: Not on file   Number of children: 9   Years of education: Not on file   Highest education level: Not on file  Occupational History   Not on file  Tobacco Use   Smoking status: Every Day    Current packs/day: 0.25    Types: Cigarettes   Smokeless tobacco: Current  Substance and Sexual Activity   Alcohol use: Yes    Alcohol/week: 2.0 standard drinks of alcohol    Types: 2 Cans of beer per week    Comment: occas   Drug use: Not Currently    Comment: ued to smoke weed   Sexual activity: Not on file  Other Topics Concern   Not on file  Social History Narrative   Lives alone with a room mate .    Social Drivers of Corporate investment banker Strain: Not on file  Food Insecurity: Not on file  Transportation Needs: Not on file  Physical Activity: Not on file  Stress: Not on file  Social Connections: Not on file  Intimate Partner Violence: Not on file    Family History:   Family History  Problem Relation Age of Onset   Diabetes Father    Colon cancer Neg Hx    Prostate cancer Neg Hx      ROS:  Please see the history of present illness.  All  other ROS reviewed and negative.     Physical Exam/Data:   Vitals:   05/11/23 0900 05/11/23 1000 05/11/23 1029 05/11/23 1030  BP: 122/64 119/72 119/72   Pulse: 75 68 77 76  Resp: 17 13  11   Temp:      TempSrc:      SpO2: 98% 96%  99%  Weight:      Height:        Intake/Output Summary (Last 24 hours) at 05/11/2023 1109 Last data filed at 05/11/2023 0408 Gross per 24 hour  Intake 84.71 ml  Output 750 ml  Net -665.29 ml  05/10/2023   11:43 AM 12/16/2022    8:21 AM 09/14/2022    3:46 PM  Last 3 Weights  Weight (lbs) 160 lb 150 lb 9.6 oz 147 lb 12.8 oz  Weight (kg) 72.576 kg 68.312 kg 67.042 kg     Body mass index is 28.34 kg/m.  General: Well developed, well nourished, in no acute distress. Head: Normocephalic, atraumatic, sclera non-icteric, no xanthomas, nares are without discharge. Neck: Negative for carotid bruits. JVP not elevated. Lungs: Clear bilaterally to auscultation without wheezes, rales, or rhonchi. Breathing is unlabored. Heart: RRR S1 S2 without murmurs, rubs, or gallops.  Abdomen: Soft, non-tender, non-distended with normoactive bowel sounds. No rebound/guarding. Extremities: No clubbing or cyanosis. No edema. Distal pedal pulses are 2+ and equal bilaterally. Neuro: Alert and oriented X 3. Moves all extremities spontaneously. Psych:  Responds to questions appropriately with a normal affect.   EKG:  The EKG was personally reviewed and demonstrates: NSR 85bpm with anterior Q waves V1-V4 (previously present V1-V3) with more pronounced anterior ST upsloping V2-V3 with biphasic STTW changes V4-V5, nonspecific STTW changes otherwise. Concerning for recent MI.  Telemetry:  Telemetry was personally reviewed and demonstrates:  NSR  Relevant CV Studies: N/A  Laboratory Data:  High Sensitivity Troponin:   Recent Labs  Lab 05/10/23 1423 05/10/23 1727  TROPONINIHS 3,456* 3,333*     Chemistry Recent Labs  Lab 05/10/23 1153  NA 137  K 4.0  CL 103  CO2  26  GLUCOSE 124*  BUN 16  CREATININE 0.69  CALCIUM 9.7  GFRNONAA >60  ANIONGAP 8    Recent Labs  Lab 05/10/23 1153  PROT 8.0  ALBUMIN 4.3  AST 29  ALT 20  ALKPHOS 93  BILITOT 1.6*   Lipids  Recent Labs  Lab 05/11/23 0551  CHOL 145  TRIG 85  HDL 24*  LDLCALC 104*  CHOLHDL 6.0    Hematology Recent Labs  Lab 05/10/23 1153 05/11/23 0551  WBC 7.0 7.5  RBC 5.04 4.39  HGB 15.9 13.7  HCT 47.3 40.9  MCV 93.8 93.2  MCH 31.5 31.2  MCHC 33.6 33.5  RDW 12.8 12.9  PLT 222 182   Thyroid No results for input(s): "TSH", "FREET4" in the last 168 hours.  BNPNo results for input(s): "BNP", "PROBNP" in the last 168 hours.  DDimer No results for input(s): "DDIMER" in the last 168 hours.   Radiology/Studies:  [facility] Chest Port 1 View Result Date: 05/10/2023 CLINICAL DATA:  Chest pain. EXAM: PORTABLE CHEST 1 VIEW COMPARISON:  February 03, 2013. FINDINGS: The heart size and mediastinal contours are within normal limits. Both lungs are clear. The visualized skeletal structures are unremarkable. IMPRESSION: No active disease. Electronically Signed   By: Lupita Raider M.D.   On: 05/10/2023 16:37   CT ABDOMEN PELVIS WO CONTRAST Result Date: 05/10/2023 CLINICAL DATA:  Abdominal pain. EXAM: CT ABDOMEN AND PELVIS WITHOUT CONTRAST TECHNIQUE: Multidetector CT imaging of the abdomen and pelvis was performed following the standard protocol without IV contrast. RADIATION DOSE REDUCTION: This exam was performed according to the departmental dose-optimization program which includes automated exposure control, adjustment of the mA and/or kV according to patient size and/or use of iterative reconstruction technique. COMPARISON:  CT abdomen pelvis dated 09/06/2022. FINDINGS: Evaluation of this exam is limited in the absence of intravenous contrast. Lower chest: Bibasilar subpleural atelectasis/scarring. No intra-abdominal free air or free fluid. Hepatobiliary: The liver is unremarkable. No biliary  dilatation. The gallbladder is unremarkable. Pancreas: Unremarkable. No pancreatic ductal dilatation  or surrounding inflammatory changes. Spleen: Normal in size without focal abnormality. Adrenals/Urinary Tract: The adrenal glands unremarkable. There is no hydronephrosis on limited physis on either side. The visualized ureters and urinary bladder appear unremarkable. Stomach/Bowel: There is sigmoid diverticulosis and scattered colonic diverticula. There is moderate stool throughout the colon. No bowel obstruction or active inflammation. The appendix is normal. Vascular/Lymphatic: Mild aortoiliac atherosclerotic disease. The IVC is unremarkable. No portal venous gas. There is no adenopathy. Reproductive: The prostate and seminal vesicles are grossly unremarkable. Other: Small fat containing left inguinal hernia. Musculoskeletal: Osteopenia with degenerative changes of the spine. Multilevel disc desiccation and vacuum phenomena. No acute osseous pathology. IMPRESSION: 1. No acute intra-abdominal or pelvic pathology. 2. Colonic diverticulosis.  No bowel obstruction. Normal appendix. 3.  Aortic Atherosclerosis (ICD10-I70.0). Electronically Signed   By: Elgie Collard M.D.   On: 05/10/2023 15:28     Assessment and Plan:   1. Precordial pain rules in for NSTEMI - EKG concerning for recent MI - got ASA 324mg  x1, to start 81mg  daily today, started on Lopressor 25mg  BID - titrate atorvastatin to 80mg  daily for LDL 104 - If the patient is tolerating statin at time of follow-up appointment, would consider rechecking liver function/lipid panel in 6-8 weeks, refer for PCSK9i if not at goal at that time - continue heparin per pharmacy - 2d echo - recommend transfer to [facility] for cath, d/w Dr. Odis Hollingshead who agrees  Informed Consent   Shared Decision Making/Informed Consent The risks [stroke (1 in 1000), death (1 in 1000), kidney failure [usually temporary] (1 in 500), bleeding (1 in 200), allergic reaction [possibly  serious] (1 in 200)], benefits (diagnostic support and management of coronary artery disease) and alternatives of a cardiac catheterization were discussed in detail with Mr. Dotzler and he is willing to proceed.    2. HLD - statin change as above  3. HTN - Cr normal, SBP low 100s-1teens - since being started on BB, hold lisinopril to allow BP room  4. DM - hold metformin given plan for cath  5. Tobacco abuse - continue to encourage cessation, smokes 2 cigarettes/day  6. Elevated bilirubin - per primary team; intermittently up in past as well  Risk Assessment/Risk Scores:     TIMI Risk Score for Unstable Angina or Non-ST Elevation MI:   The patient's TIMI risk score is 3, which indicates a 13% risk of all cause mortality, new or recurrent myocardial infarction or need for urgent revascularization in the next 14 days.   For questions or updates, please contact [facility] Please consult [email or web address] for contact info under    Signed, Laurann Montana, PA-C  05/11/2023 9:49 AM  ADDENDUM:   Patient seen and examined.  I personally taken a history, examined the patient, reviewed relevant notes,  laboratory data / imaging studies.  I performed a substantive portion of this encounter and formulated the important aspects of the plan.  I agree with the APP's note, impression, and recommendations; however, I have edited the note to reflect changes or salient points.   Resting in bed comfortably. Chest pain onset Monday, 05/08/2023 at approximately 2 AM. The pain is located over the anterior precordium, right greater than sign left Has had at least 2 episodes since Monday, May 08, 2023. Not able to quantify exertional chest pain as he walks with a cane. He started experiencing right arm pain and had a recurrent event yesterday and comes to the hospital for further evaluation.  Currently he is  pain-free. Denies orthopnea, PND, or lower extremity swelling.  PHYSICAL  EXAM: Today's Vitals   05/11/23 1000 05/11/23 1029 05/11/23 1030 05/11/23 1052  BP: 119/72 119/72    Pulse: 68 77 76   Resp: 13  11   Temp:      TempSrc:      SpO2: 96%  99%   Weight:      Height:      PainSc:    0-No pain   Body mass index is 28.34 kg/m.   Net IO Since Admission: -665.29 mL [05/11/23 1109]  Filed Weights   05/10/23 1143  Weight: 72.6 kg    Physical Exam  Constitutional: No distress.  hemodynamically stable  Neck: No JVD present.  Cardiovascular: Normal rate, regular rhythm, S1 normal and S2 normal. Exam reveals no gallop, no S3 and no S4.  No murmur heard. Pulmonary/Chest: Effort normal and breath sounds normal. No stridor. He has no wheezes. He has no rales.  Musculoskeletal:        General: No edema.     Cervical back: Neck supple.  Skin: Skin is warm.    EKG: (personally reviewed by me) 05/10/2023: Sinus rhythm, 85 bpm, consider old anteroseptal infarct with ST-T changes in the anterior leads concerning for possible anterior ischemia.  05/11/2023: Sinus rhythm, 76 bpm, consider anteroseptal infarct with dynamic ST-T changes similar to prior tracing.  Telemetry: (personally reviewed by me) Sinus rhythm   Impression:  NSTEMI Cigarette smoking. Diabetes mellitus type 2. Hypertension Hyperlipidemia  Recommendations:  NSTEMI: Symptoms concerning for cardiac discomfort, since he ambulates with a walker exertional discomfort is hard to ascertain Index event likely Monday, 05/08/2023 High sensitive troponins elevated, peaked at 3456 EKG shows sinus rhythm with dynamic changes as described above. Multiple cardiovascular risk factors including cigarette smoking and diabetes We discussed the role of ischemic workup and given the pretest probability for obstructive disease is high and he rules in for NSTEMI left heart catheterization as requested/recommended.  After discussing the risks, benefits, alternatives to left heart catheterization with  possible intervention patient agrees to proceed forward. I did use a Engineer, structural via [employer] which provided video and audio services interpreter name [place] [phone]. I offered to speak to his next of kin.  Patient states that he is married but separated.  He wanted me to talk to his niece Franco Collet who can be reached at [phone].  Their questions and concerns addressed.  Reviewed the consent with them as well.  Cigarette smoking: Tobacco cessation counseling: Currently smoking 2 cigarettes per day.  He is informed of the dangers of tobacco abuse including stroke, cancer, and MI, as well as benefits of tobacco cessation. He is willing to quit at this time. 3 mins were spent counseling patient cessation techniques. We discussed various methods to help quit smoking, including deciding on a date to quit, joining a support group, pharmacological agents- nicotine gum/patch/lozenges.   Diabetes mellitus type 2: Reemphasized importance of glycemic control.  Hyperlipidemia: At goal LDL for now at least <70 mg/dL continue rosuvastatin 80 mg p.o. daily  Further recommendations to follow as the case evolves.  As part of today's encounter independently reviewed labs from 2/19 and 05/11/2023, utilized audio and visual interpretation services as patient predominately speaks Spanish, spoke to his niece as per his request, independently reviewed EKGs, in addition diagnostic workup in the past, and obtaining informed consent as noted above for left heart catheterization with possible intervention.  [facility] at bedside to transfer the patient  to [facility].   This note was created using a voice recognition software as a result there may be grammatical errors inadvertently enclosed that do not reflect the nature of this encounter. Every attempt is made to correct such errors.   Tessa Lerner, DO, [facility] [place]  [facility]  [address] [place], [place]  [address] Pager: [phone] Office: [phone] 05/11/2023 11:09 AM

## 2023-05-12 ENCOUNTER — Inpatient Hospital Stay (HOSPITAL_COMMUNITY): Payer: MEDICAID

## 2023-05-12 ENCOUNTER — Inpatient Hospital Stay (HOSPITAL_COMMUNITY): Payer: Self-pay

## 2023-05-12 DIAGNOSIS — Z01810 Encounter for preprocedural cardiovascular examination: Secondary | ICD-10-CM

## 2023-05-12 DIAGNOSIS — I25118 Atherosclerotic heart disease of native coronary artery with other forms of angina pectoris: Secondary | ICD-10-CM

## 2023-05-12 DIAGNOSIS — I2510 Atherosclerotic heart disease of native coronary artery without angina pectoris: Secondary | ICD-10-CM

## 2023-05-12 DIAGNOSIS — E785 Hyperlipidemia, unspecified: Secondary | ICD-10-CM

## 2023-05-12 DIAGNOSIS — I10 Essential (primary) hypertension: Secondary | ICD-10-CM

## 2023-05-12 DIAGNOSIS — E782 Mixed hyperlipidemia: Secondary | ICD-10-CM

## 2023-05-12 DIAGNOSIS — I739 Peripheral vascular disease, unspecified: Secondary | ICD-10-CM

## 2023-05-12 DIAGNOSIS — I214 Non-ST elevation (NSTEMI) myocardial infarction: Secondary | ICD-10-CM

## 2023-05-12 DIAGNOSIS — F17210 Nicotine dependence, cigarettes, uncomplicated: Secondary | ICD-10-CM

## 2023-05-12 DIAGNOSIS — E1165 Type 2 diabetes mellitus with hyperglycemia: Secondary | ICD-10-CM

## 2023-05-12 LAB — CBC
HCT: 41.6 % (ref 39.0–52.0)
Hemoglobin: 14.2 g/dL (ref 13.0–17.0)
MCH: 31.3 pg (ref 26.0–34.0)
MCHC: 34.1 g/dL (ref 30.0–36.0)
MCV: 91.6 fL (ref 80.0–100.0)
Platelets: 210 10*3/uL (ref 150–400)
RBC: 4.54 MIL/uL (ref 4.22–5.81)
RDW: 12.9 % (ref 11.5–15.5)
WBC: 7.9 10*3/uL (ref 4.0–10.5)
nRBC: 0 % (ref 0.0–0.2)

## 2023-05-12 LAB — PULMONARY FUNCTION TEST
FEF 25-75 Pre: 3.76 L/s
FEF2575-%Pred-Pre: 161 %
FEV1-%Pred-Pre: 106 %
FEV1-Pre: 3.05 L
FEV1FVC-%Pred-Pre: 115 %
FEV6-%Pred-Pre: 97 %
FEV6-Pre: 3.51 L
FEV6FVC-%Pred-Pre: 105 %
FVC-%Pred-Pre: 92 %
FVC-Pre: 3.54 L
Pre FEV1/FVC ratio: 86 %
Pre FEV6/FVC Ratio: 100 %

## 2023-05-12 LAB — ECHOCARDIOGRAM COMPLETE
Area-P 1/2: 4.21 cm2
Calc EF: 52.9 %
Height: 63 in
S' Lateral: 3.1 cm
Single Plane A2C EF: 60.5 %
Single Plane A4C EF: 42.6 %
Weight: 2560 [oz_av]

## 2023-05-12 LAB — BASIC METABOLIC PANEL
Anion gap: 11 (ref 5–15)
BUN: 18 mg/dL (ref 8–23)
CO2: 22 mmol/L (ref 22–32)
Calcium: 9.1 mg/dL (ref 8.9–10.3)
Chloride: 103 mmol/L (ref 98–111)
Creatinine, Ser: 0.74 mg/dL (ref 0.61–1.24)
GFR, Estimated: >60 mL/min (ref >60)
Glucose, Bld: 122 mg/dL — ABNORMAL HIGH (ref 70–99)
Potassium: 3.6 mmol/L (ref 3.5–5.1)
Sodium: 136 mmol/L (ref 135–145)

## 2023-05-12 LAB — GLUCOSE, CAPILLARY
Glucose-Capillary: 102 mg/dL — ABNORMAL HIGH (ref 70–99)
Glucose-Capillary: 134 mg/dL — ABNORMAL HIGH (ref 70–99)
Glucose-Capillary: 138 mg/dL — ABNORMAL HIGH (ref 70–99)
Glucose-Capillary: 145 mg/dL — ABNORMAL HIGH (ref 70–99)
Glucose-Capillary: 151 mg/dL — ABNORMAL HIGH (ref 70–99)
Glucose-Capillary: 211 mg/dL — ABNORMAL HIGH (ref 70–99)
Glucose-Capillary: 235 mg/dL — ABNORMAL HIGH (ref 70–99)

## 2023-05-12 LAB — HEPARIN LEVEL (UNFRACTIONATED)
Heparin Unfractionated: 0.22 [IU]/mL — ABNORMAL LOW (ref 0.30–0.70)
Heparin Unfractionated: 0.43 [IU]/mL (ref 0.30–0.70)

## 2023-05-12 LAB — VAS US DOPPLER PRE CABG
Left ABI: 1.17
Right ABI: 0.95

## 2023-05-12 LAB — TSH: TSH: 1.362 u[IU]/mL (ref 0.350–4.500)

## 2023-05-12 MED ORDER — POTASSIUM CHLORIDE 2 MEQ/ML IV SOLN
80.0000 meq | INTRAVENOUS | Status: DC
  Filled 2023-05-12: qty 40

## 2023-05-12 MED ORDER — INSULIN REGULAR(HUMAN) IN NACL 100-0.9 UT/100ML-% IV SOLN
INTRAVENOUS | Status: AC
  Administered 2023-05-15: 1.8 [IU]/h via INTRAVENOUS
  Filled 2023-05-12: qty 100

## 2023-05-12 MED ORDER — TRANEXAMIC ACID 1000 MG/10ML IV SOLN
1.5000 mg/kg/h | INTRAVENOUS | Status: AC
  Administered 2023-05-15: 1.5 mg/kg/h via INTRAVENOUS
  Filled 2023-05-12: qty 25

## 2023-05-12 MED ORDER — PLASMA-LYTE A IV SOLN
INTRAVENOUS | Status: DC
  Filled 2023-05-12: qty 2.5

## 2023-05-12 MED ORDER — TRANEXAMIC ACID (OHS) BOLUS VIA INFUSION
15.0000 mg/kg | INTRAVENOUS | Status: AC
  Administered 2023-05-15: 1089 mg via INTRAVENOUS
  Filled 2023-05-12: qty 1089

## 2023-05-12 MED ORDER — CEFAZOLIN SODIUM-DEXTROSE 2-4 GM/100ML-% IV SOLN
2.0000 g | INTRAVENOUS | Status: DC
  Filled 2023-05-12: qty 100

## 2023-05-12 MED ORDER — MAGNESIUM SULFATE 50 % IJ SOLN
40.0000 meq | INTRAMUSCULAR | Status: DC
  Filled 2023-05-12: qty 9.85

## 2023-05-12 MED ORDER — CEFAZOLIN SODIUM-DEXTROSE 2-4 GM/100ML-% IV SOLN
2.0000 g | INTRAVENOUS | Status: AC
  Administered 2023-05-15 (×2): 2 g via INTRAVENOUS
  Filled 2023-05-12: qty 100

## 2023-05-12 MED ORDER — DEXMEDETOMIDINE HCL IN NACL 400 MCG/100ML IV SOLN
0.1000 ug/kg/h | INTRAVENOUS | Status: AC
  Administered 2023-05-15: .4 ug/kg/h via INTRAVENOUS
  Filled 2023-05-12: qty 100

## 2023-05-12 MED ORDER — EPINEPHRINE HCL 5 MG/250ML IV SOLN IN NS
0.0000 ug/min | INTRAVENOUS | Status: DC
  Filled 2023-05-12: qty 250

## 2023-05-12 MED ORDER — NITROGLYCERIN IN D5W 200-5 MCG/ML-% IV SOLN
2.0000 ug/min | INTRAVENOUS | Status: DC
  Filled 2023-05-12: qty 250

## 2023-05-12 MED ORDER — HEPARIN 30,000 UNITS/1000 ML (OHS) CELLSAVER SOLUTION
Status: DC
  Filled 2023-05-12: qty 1000

## 2023-05-12 MED ORDER — TRANEXAMIC ACID (OHS) PUMP PRIME SOLUTION
2.0000 mg/kg | INTRAVENOUS | Status: DC
  Filled 2023-05-12: qty 1.45

## 2023-05-12 MED ORDER — MILRINONE LACTATE IN DEXTROSE 20-5 MG/100ML-% IV SOLN
0.3000 ug/kg/min | INTRAVENOUS | Status: DC
  Filled 2023-05-12: qty 100

## 2023-05-12 MED ORDER — PHENYLEPHRINE HCL-NACL 20-0.9 MG/250ML-% IV SOLN
30.0000 ug/min | INTRAVENOUS | Status: DC
  Filled 2023-05-12: qty 250

## 2023-05-12 MED ORDER — NOREPINEPHRINE 4 MG/250ML-% IV SOLN
0.0000 ug/min | INTRAVENOUS | Status: DC
  Filled 2023-05-12: qty 250

## 2023-05-12 MED ORDER — VANCOMYCIN HCL 1250 MG/250ML IV SOLN
1250.0000 mg | INTRAVENOUS | Status: AC
  Administered 2023-05-15: 1250 mg via INTRAVENOUS
  Filled 2023-05-12: qty 250

## 2023-05-12 NOTE — Plan of Care (Signed)
  Problem: Nutritional: Goal: Progress toward achieving an optimal weight will improve Outcome: Progressing   Problem: Tissue Perfusion: Goal: Adequacy of tissue perfusion will improve Outcome: Progressing   Problem: Cardiac: Goal: Ability to achieve and maintain adequate cardiovascular perfusion will improve Outcome: Progressing   Problem: Education: Goal: Understanding of CV disease, CV risk reduction, and recovery process will improve Outcome: Progressing   Problem: Clinical Measurements: Goal: Ability to maintain clinical measurements within normal limits will improve Outcome: Progressing   Problem: Elimination: Goal: Will not experience complications related to bowel motility Outcome: Progressing

## 2023-05-12 NOTE — Progress Notes (Signed)
 ANTICOAGULATION CONSULT NOTE  Pharmacy Consult for heparin Indication: chest pain/ACS, s/p cath, getting CVTS consult  No Known Allergies  Patient Measurements: Height: 5\' 3"  (160 cm) Weight: 72.6 kg (160 lb) IBW/kg (Calculated) : 56.9 Heparin Dosing Weight: 72 kg   Vital Signs: Temp: 98.5 F (36.9 C) (02/21 0454) Temp Source: Oral (02/21 0454) BP: 111/73 (02/21 0454) Pulse Rate: 86 (02/20 1830)  Labs: Recent Labs    05/10/23 1153 05/10/23 1423 05/10/23 1540 05/10/23 1727 05/10/23 2229 05/11/23 0551 05/11/23 1509 05/12/23 0348  HGB 15.9  --   --   --   --  13.7  --  14.2  HCT 47.3  --   --   --   --  40.9  --  41.6  PLT 222  --   --   --   --  182  --  210  APTT  --   --  26  --   --   --   --   --   LABPROT  --   --  13.2  --   --   --   --   --   INR  --   --  1.0  --   --   --   --   --   HEPARINUNFRC  --   --   --   --    < > 0.31 1.07* 0.22*  CREATININE 0.69  --   --   --   --   --   --   --   TROPONINIHS  --  3,456*  --  3,333*  --   --   --   --    < > = values in this interval not displayed.    Estimated Creatinine Clearance: 83.4 mL/min (by C-G formula based on SCr of 0.69 mg/dL).  Medical History: Past Medical History:  Diagnosis Date   Arthritis of left knee    Cervical stenosis of spine    Diabetes mellitus without complication (HCC)    Hyperlipidemia LDL goal <70    Primary hypertension 02/07/2022   Tobacco use     Medications:  See MAR  Assessment: 63 yo male presented to [facility] ED 2/19 with episodic abdominal and radiating chest pain with elevated troponins and EKG changes transferred to [facility] for LHC.  Not on anticoagulation prior to admission.  Pharmacy consulted for heparin dosing.  Now s/p cath revealing multi-vessel CAD, TCTS consulted for CABG consideration.  Plan to restart heparin 2h post TR band removal.  Previously therapeutic pre-cath on 1050 units/hr (HL 0.31).  Received 6000 units in cath ~1400.    2/21 AM update:  Heparin level  sub-therapeutic after re-start s/p cath Getting CVTS consult  Goal of Therapy:  Heparin level 0.3-0.7 units/ml Monitor platelets by anticoagulation protocol: Yes   Plan:  Inc heparin to 1200 units/hr Heparin level in 8 hours F/U CVTS recommendations   Abran Duke, PharmD, BCPS Clinical Pharmacist Phone: [phone]

## 2023-05-12 NOTE — H&P (View-Only) (Signed)
 [address]       [place] [address]             [phone]        [name] [facility] Medical Record #[id number] Date of Birth: 1960/04/11  Referring: Tonny Bollman, MD  Primary Care: Donell Beers, FNP Primary Cardiologist:None  Reason for Consult: Evaluation for coronary bypass grafting after presenting with acute non-ST elevation myocardial infarction.   History of Present Illness: [name] is a 63 year old gentleman with a past medical history notable for hypertension, type 2 diabetes mellitus, dyslipidemia, and tobacco use.  He had no prior cardiac history.  He presented to the [facility] emergency room on Wednesday, 05/10/2023 after having chest pain with associated abdominal pain several times over the previous 2 days.  He denied having any nausea, vomiting, or diaphoresis.  Once he arrived to the emergency room, he was not having any chest or abdominal pain and has not had any discomfort since.  Evaluation in the ED included an EKG that showed sinus rhythm with evidence of an anterior MI, age undetermined.  The high-sensitivity troponin level was greater than 3400.  A chest x-ray and CT of the abdomen and pelvis were unrevealing.  [name] was treated with aspirin and started on a heparin infusion for suspected acute non-ST elevation myocardial infarction.  He was transferred to [facility] for further evaluation.  He has remained stable since his initial presentation.  He underwent left heart catheterization yesterday demonstrating severe two-vessel coronary artery disease with a high-grade stenoses involving the ostium and proximal LAD as well as the first diagonal.  Additionally, there was severe stenosis of a small first OM branch of the circumflex and moderately severe stenosis of the distal circumflex.  He was dominant with mild nonobstructive disease.  Left ventriculogram showed severe inferoapical and periapical hypokinesis with a left  ventricular ejection fraction of 45%.  CT surgery has been asked asked to evaluate for consideration of coronary bypass grafting.  [name] is currently resting in bed with IV heparin infusing.  He denies any chest discomfort or shortness of breath.  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  He is married but separated from his wife.  He has several family members in the area that he said would be able to help him in recovery phase after surgery.  He has not visited a dentist in several years and tells me he has some loose teeth on the upper and lower jaw.  He denies any previous surgeries.   Current Activity/ Functional Status: Patient is independent with mobility/ambulation, transfers, ADL's, IADL's.   Zubrod Score: At the time of surgery this patient's most appropriate activity status/level should be described as: []     0    Normal activity, no symptoms [x]     1    Restricted in physical strenuous activity but ambulatory, able to do out light work []     2    Ambulatory and capable of self care, unable to do work activities, up and about                 more than 50%  Of the time                            []     3    Only limited self care, in bed greater than 50%  of waking hours []     4    Completely disabled, no self care, confined to bed or chair []     5    Moribund  Past Medical History:  Diagnosis Date   Arthritis of left knee    Cervical stenosis of spine    Diabetes mellitus without complication (HCC)    Hyperlipidemia LDL goal <70    Primary hypertension 02/07/2022   Tobacco use     Past Surgical History:  Procedure Laterality Date   LEFT HEART CATH AND CORONARY ANGIOGRAPHY N/A 05/11/2023   Procedure: LEFT HEART CATH AND CORONARY ANGIOGRAPHY;  Surgeon: Tonny Bollman, MD;  Location: [facility] INVASIVE CV LAB;  Service: Cardiovascular;  Laterality: N/A;    Social History   Tobacco Use  Smoking Status Every Day   Current packs/day: 0.25   Types: Cigarettes   Smokeless Tobacco Current    Social History   Substance and Sexual Activity  Alcohol Use Yes   Alcohol/week: 2.0 standard drinks of alcohol   Types: 2 Cans of beer per week   Comment: occas     No Known Allergies  Current Facility-Administered Medications  Medication Dose Route Frequency Provider Last Rate Last Admin   0.9 %  sodium chloride infusion  250 mL Intravenous PRN Tonny Bollman, MD       acetaminophen (TYLENOL) tablet 650 mg  650 mg Oral Q6H PRN Tonny Bollman, MD       aspirin EC tablet 81 mg  81 mg Oral Daily Tonny Bollman, MD       atorvastatin (LIPITOR) tablet 80 mg  80 mg Oral Daily Tonny Bollman, MD   80 mg at 05/11/23 1029   gabapentin (NEURONTIN) capsule 300 mg  300 mg Oral TID Tonny Bollman, MD   300 mg at 05/11/23 2029   heparin ADULT infusion 100 units/mL (25000 units/232mL)  1,200 Units/hr Intravenous Continuous Stevphen Rochester, RPH 12 mL/hr at 05/12/23 0600 1,200 Units/hr at 05/12/23 0600   insulin aspart (novoLOG) injection 0-15 Units  0-15 Units Subcutaneous Q4H Tonny Bollman, MD   3 Units at 05/12/23 0410   metoprolol tartrate (LOPRESSOR) tablet 25 mg  25 mg Oral BID Tonny Bollman, MD   25 mg at 05/11/23 2029   nitroGLYCERIN (NITROSTAT) SL tablet 0.4 mg  0.4 mg Sublingual Q5 Min x 3 PRN Tonny Bollman, MD       ondansetron [facility]) injection 4 mg  4 mg Intravenous Q6H PRN Tonny Bollman, MD       sodium chloride flush (NS) 0.9 % injection 3 mL  3 mL Intravenous Q12H Tonny Bollman, MD       sodium chloride flush (NS) 0.9 % injection 3 mL  3 mL Intravenous PRN Tonny Bollman, MD        Medications Prior to Admission  Medication Sig Dispense Refill Last Dose/Taking   aspirin EC 81 MG tablet Take 162 mg by mouth daily. Swallow whole.   05/09/2023 at 10:00 AM   atorvastatin (LIPITOR) 40 MG tablet Take 1 tablet (40 mg total) by mouth daily. 90 tablet 1 05/09/2023   gabapentin (NEURONTIN) 300 MG capsule Take 1 capsule (300 mg total) by mouth 3  (three) times daily. (Patient taking differently: Take 300 mg by mouth 3 (three) times daily. Take 300 mg by mouth two to three times a day) 90 capsule 3 05/09/2023   metFORMIN (GLUCOPHAGE) 1000 MG tablet Take 1 tablet (1,000 mg total) by mouth 2 (two) times daily with a meal. 120 tablet  2 05/09/2023   ibuprofen (ADVIL) 600 MG tablet Take 1 tablet (600 mg total) by mouth every 8 (eight) hours as needed. (Patient not taking: Reported on 05/10/2023) 30 tablet 1 Not Taking   lisinopril (ZESTRIL) 2.5 MG tablet Take 1 tablet (2.5 mg total) by mouth daily. (Patient not taking: Reported on 05/10/2023) 30 tablet 2 Not Taking    Family History  Problem Relation Age of Onset   Diabetes Father    Colon cancer Neg Hx    Prostate cancer Neg Hx      Review of Systems:       Cardiac Review of Systems: Y or  [    ]= no  Chest Pain [present prior to admission]  Resting SOB [   ] Exertional SOB  [  ]  Orthopnea [  ]   Pedal Edema [   ]    Palpitations [  ] Syncope  [  ]   Presyncope [   ]  General Review of Systems: [Y] = yes [  ]=no Constitional: recent weight change [  ]; anorexia [  ]; fatigue [  ]; nausea [  ]; night sweats [  ]; fever [  ]; or chills [  ]                                                               Dental: Last Dentist visit:   Eye : blurred vision [  ]; diplopia [   ]; vision changes [  ];  Amaurosis fugax[  ]; Resp: cough [  ];  wheezing[  ];  hemoptysis[  ]; shortness of breath[  ]; paroxysmal nocturnal dyspnea[  ]; dyspnea on exertion[  ]; or orthopnea[  ];  GI:  gallstones[  ], vomiting[  ];  dysphagia[  ]; melena[  ];  hematochezia [  ]; heartburn[  ];   Hx of  Colonoscopy[  ]; GU: kidney stones [  ]; hematuria[  ];   dysuria [  ];  nocturia[  ];  history of     obstruction [  ]; urinary frequency [  ]             Skin: rash, swelling[  ];, hair loss[  ];  peripheral edema[  ];  or itching[  ]; Musculosketetal: myalgias[  ];  joint swelling[  ];  joint erythema[  ];  joint pain[   ];  back pain[  ];  Heme/Lymph: bruising[  ];  bleeding[  ];  anemia[  ];  Neuro: TIA[  ];  headaches[  ];  stroke[  ];  vertigo[  ];  seizures[  ];   paresthesias[  ];  difficulty walking[  ];  Psych:depression[  ]; anxiety[  ];  Endocrine: diabetes [type 2 diabetes mellitus];  thyroid dysfunction[  ];               Physical Exam: BP 111/73 (BP Location: Left Arm)   Pulse 86   Temp 98.5 F (36.9 C) (Oral)   Resp 18   Ht 5\' 3"  (1.6 m)   Wt 72.6 kg   SpO2 99%   BMI 28.34 kg/m    General appearance: alert, cooperative, and no distress Head: Normocephalic, without obvious abnormality, atraumatic Neck: no adenopathy, no  carotid bruit, no JVD, and supple, symmetrical, trachea midline Lymph nodes: No cervical or clavicular adenopathy Resp: Normal work of breathing on room air.  Breath sounds are clear to auscultation Cardio: Regular rate and rhythm, no murmur.  Monitor shows normal sinus rhythm. GI: Active bowel sounds, soft and nontender.  No palpable masses Extremities: No deformities.  All well-perfused with no peripheral edema. Neurologic: Grossly normal  Diagnostic Studies & Laboratory data:    CARDIAC CATHETERIZATION   LEFT HEART CATH AND CORONARY ANGIOGRAPHY   Conclusion  1.  Patent left main with mild diffuse nonobstructive plaquing of up to 30% 2.  Severe ostial and proximal LAD stenosis of 75% with critical LAD stenosis at the first diagonal origin of 95% (lesion involves a large diagonal) 3.  Severe stenosis of a small first OM branch of the circumflex and moderately severe stenosis of the distal circumflex 4.  Patent, dominant RCA with mild nonobstructive plaquing 5.  Moderate segmental LV dysfunction with periapical and inferoapical severe hypokinesis, LVEF estimated at 45%.   Recommendations: Resume IV heparin, obtain cardiac surgical consultation for consideration of CABG.  The patient has significant stenosis involving the ostium of the LAD as well as disease  involving the LAD/diagonal bifurcation.  Coronary Findings  Diagnostic Dominance: Right Left Main  Ost LM to Dist LM lesion is 30% stenosed.    Left Anterior Descending  Ost LAD to Prox LAD lesion is 75% stenosed.  Prox LAD to Mid LAD lesion is 95% stenosed.  Mid LAD lesion is 50% stenosed.    First Diagonal Branch  1st Diag lesion is 75% stenosed.    Left Circumflex  Mid Cx to Dist Cx lesion is 70% stenosed.    First Obtuse Marginal Branch  1st Mrg lesion is 85% stenosed.    Right Coronary Artery  There is mild diffuse disease throughout the vessel. Dominant vessel, mild nonobstructive plaquing without any significant stenoses. The PDA and PLA branches are all patent with no significant stenoses.    Right Posterior Descending Artery  There is mild disease in the vessel.    Intervention   No interventions have been documented.   Wall Motion              Left Heart  Left Ventricle The left ventricular size is normal. There is moderate left ventricular systolic dysfunction. LV end diastolic pressure is normal. The left ventricular ejection fraction is 35-45% by visual estimate. There are LV function abnormalities due to segmental dysfunction.   Coronary Diagrams  Diagnostic Dominance: Right    Recent Radiology Findings:   DG Chest Port 1 View Result Date: 05/10/2023 CLINICAL DATA:  Chest pain. EXAM: PORTABLE CHEST 1 VIEW COMPARISON:  February 03, 2013. FINDINGS: The heart size and mediastinal contours are within normal limits. Both lungs are clear. The visualized skeletal structures are unremarkable. IMPRESSION: No active disease. Electronically Signed   By: Lupita Raider M.D.   On: 05/10/2023 16:37   CT ABDOMEN PELVIS WO CONTRAST Result Date: 05/10/2023 CLINICAL DATA:  Abdominal pain. EXAM: CT ABDOMEN AND PELVIS WITHOUT CONTRAST TECHNIQUE: Multidetector CT imaging of the abdomen and pelvis was performed following the standard protocol without IV contrast.  RADIATION DOSE REDUCTION: This exam was performed according to the departmental dose-optimization program which includes automated exposure control, adjustment of the mA and/or kV according to patient size and/or use of iterative reconstruction technique. COMPARISON:  CT abdomen pelvis dated 09/06/2022. FINDINGS: Evaluation of this exam is limited in the absence of  intravenous contrast. Lower chest: Bibasilar subpleural atelectasis/scarring. No intra-abdominal free air or free fluid. Hepatobiliary: The liver is unremarkable. No biliary dilatation. The gallbladder is unremarkable. Pancreas: Unremarkable. No pancreatic ductal dilatation or surrounding inflammatory changes. Spleen: Normal in size without focal abnormality. Adrenals/Urinary Tract: The adrenal glands unremarkable. There is no hydronephrosis on limited physis on either side. The visualized ureters and urinary bladder appear unremarkable. Stomach/Bowel: There is sigmoid diverticulosis and scattered colonic diverticula. There is moderate stool throughout the colon. No bowel obstruction or active inflammation. The appendix is normal. Vascular/Lymphatic: Mild aortoiliac atherosclerotic disease. The IVC is unremarkable. No portal venous gas. There is no adenopathy. Reproductive: The prostate and seminal vesicles are grossly unremarkable. Other: Small fat containing left inguinal hernia. Musculoskeletal: Osteopenia with degenerative changes of the spine. Multilevel disc desiccation and vacuum phenomena. No acute osseous pathology.   IMPRESSION: 1. No acute intra-abdominal or pelvic pathology. 2. Colonic diverticulosis.  No bowel obstruction. Normal appendix. 3.  Aortic Atherosclerosis (ICD10-I70.0). Electronically Signed   By: Elgie Collard M.D.   On: 05/10/2023 15:28     I have independently reviewed the above radiologic studies and discussed with the patient   Recent Lab Findings: Lab Results  Component Value Date   WBC 7.9 05/12/2023   HGB 14.2  05/12/2023   HCT 41.6 05/12/2023   PLT 210 05/12/2023   GLUCOSE 122 (H) 05/12/2023   CHOL 145 05/11/2023   TRIG 85 05/11/2023   HDL 24 (L) 05/11/2023   LDLCALC 104 (H) 05/11/2023   ALT 20 05/10/2023   AST 29 05/10/2023   NA 136 05/12/2023   K 3.6 05/12/2023   CL 103 05/12/2023   CREATININE 0.74 05/12/2023   BUN 18 05/12/2023   CO2 22 05/12/2023   TSH 1.362 05/12/2023   INR 1.0 05/10/2023   HGBA1C 6.7 (H) 05/10/2023      Assessment / Plan:      -Multivessel coronary artery disease: 63 year old male with multiple risk factors including hypertension, type 2 diabetes mellitus, dyslipidemia, and tobacco use with likely late presentation after acute non-ST elevation myocardial infarction in the anterior apical distribution earlier this week.  He did have severe ostial and mid LAD stenoses along with high-grade stenosis in the first diagonal coronary artery.  He has an 85% stenosis in proximal OM1 which is a small vessel and a 70% stenosis in the distal circumflex.  There is no significant obstructive disease in the RCA.  Left ventricular ejection fraction is estimated at 45% on LV gram.  The echocardiogram is pending.  We agree that coronary bypass grafting is his best option for revascularization.  We discussed the operation and expected perioperative course.  His questions were answered.  We have tentatively scheduled [name] for Monday, 05/15/2023 for coronary bypass grafting by Dr. Dorris Fetch.  -Type 2 diabetes mellitus: With peripheral neuropathy treated with gabapentin, A1c was 6.7 this admission.  On metformin prior to admission for the diabetes  -Hypertension: Prescribed low-dose lisinopril prior to admission but was not taking  -Dyslipidemia: On atorvastatin, not clear if he was taking this as prescribed prior to admission.  -Current smoker: Counseled regarding the relationship of the use of tobacco products with vascular disease and advised to quit  I  spent 30 minutes  counseling the patient face to face.  Leary Roca, PA-C  05/12/2023 9:07 AM  I have seen and examined, [name] and reviewed his cath and echo images.  Used professional interpreter for discussion of surgery and risks. Pilar #  [id number]  63 yo man with multiple cardiac risk factors presented with abdominal/ chest pain.   Abdominal w/u negative, r/I for non-STEMI.  Cath showed severe 2 vessel CAD.  CABG indicated for survival benefit and relief of symptoms.    We discussed the general nature of the procedure, including the need for general anesthesia, the incisions to be used, the use of cardiopulmonary bypass, and the use of temporary pacemaker wires and drainage tubes postoperatively.  We discussed the expected hospital stay, overall recovery and short and long term outcomes.  I informed him of the indications, risks, benefits and alternatives.  He understands the risks include, but are not limited to death, stroke, MI, DVT/PE, bleeding, possible need for transfusion, infections, cardiac arrhythmias, as well as other organ system dysfunction including respiratory, renal, or GI complications.   He wishes to talk with his family before making a final decision.  Tentatively plan for CABG Monday AM.  Salvatore Decent. Dorris Fetch, MD [facility] [phone]

## 2023-05-12 NOTE — Care Management (Signed)
 Transition of Care [facility]) - Inpatient Brief Assessment   Patient Details  Name: [name] MRN: [id number] Date of Birth: Aug 16, 1960  [facility] CM/SW Contact:    Phillip Pares, RN Phone Number: 05/12/2023, 3:28 PM   Clinical Narrative:  Called [name] to discuss discharge planning.  [name]  [phone]. Patient presented to [facility] for chest pain. Transferred to [facility] for cath ,Recommendations are for CV surgery CABG. Patient  stated he has watched the video and understands what he can and cannot do. Patient has a wheelchair and a walker at home but was walking prior to this event,  He has friends that can help after surgery and assist him in finances, he can pay little by little Discussed assisting with Medications MATCH. His medicaid is pending.   TOC will follow for needs, recommendations, and transitions of care  Transition of Care Asessment: Insurance and Status: Selfpay Patient has primary care physician: Yes Home environment has been reviewed: Single family Prior level of function:: Independent Prior/Current Home Services: No current home services Social Drivers of Health Review: SDOH reviewed no interventions necessary Readmission risk has been reviewed: Yes Transition of care needs: transition of care needs identified, TOC will continue to follow

## 2023-05-12 NOTE — Progress Notes (Signed)
 RT NOTE:  Bedside PFT performed/completed with no complications noted.

## 2023-05-12 NOTE — Progress Notes (Addendum)
 ANTICOAGULATION CONSULT NOTE  Pharmacy Consult for heparin Indication: chest pain/ACS, s/p cath, getting CVTS consult  No Known Allergies  Patient Measurements: Height: 5\' 3"  (160 cm) Weight: 72.6 kg (160 lb) IBW/kg (Calculated) : 56.9 Heparin Dosing Weight: 72 kg   Vital Signs: Temp: 98.5 F (36.9 C) (02/21 0454) Temp Source: Oral (02/21 0454) BP: 108/75 (02/21 0915)  Labs: Recent Labs    05/10/23 1153 05/10/23 1423 05/10/23 1540 05/10/23 1727 05/10/23 2229 05/11/23 0551 05/11/23 1509 05/12/23 0348 05/12/23 1346  HGB 15.9  --   --   --   --  13.7  --  14.2  --   HCT 47.3  --   --   --   --  40.9  --  41.6  --   PLT 222  --   --   --   --  182  --  210  --   APTT  --   --  26  --   --   --   --   --   --   LABPROT  --   --  13.2  --   --   --   --   --   --   INR  --   --  1.0  --   --   --   --   --   --   HEPARINUNFRC  --   --   --   --    < > 0.31 1.07* 0.22* 0.43  CREATININE 0.69  --   --   --   --   --   --  0.74  --   TROPONINIHS  --  3,456*  --  3,333*  --   --   --   --   --    < > = values in this interval not displayed.    Estimated Creatinine Clearance: 83.4 mL/min (by C-G formula based on SCr of 0.74 mg/dL).  Medical History: Past Medical History:  Diagnosis Date   Arthritis of left knee    Cervical stenosis of spine    Diabetes mellitus without complication (HCC)    Hyperlipidemia LDL goal <70    Primary hypertension 02/07/2022   Tobacco use     Medications:  See MAR  Assessment: 63 yo male presented to [facility] ED 2/19 with episodic abdominal and radiating chest pain with elevated troponins and EKG changes transferred to [facility] for LHC.  Not on anticoagulation prior to admission.  Pharmacy consulted for heparin dosing.  Cath revealing multi-vessel CAD, TCTS consulted for CABG consideration. Of note, ECHO finding mural thrombus at anteroapex.  Heparin level came back therapeutic at 0.43, on heparin infusion at 1200 units/hr. Hgb 14.2, plt 210. No s/sx  of bleeding or infusion issues.   Goal of Therapy:  Heparin level 0.3-0.7 units/ml Monitor platelets by anticoagulation protocol: Yes   Plan:  Continue heparin infusion at 1200 units/hr Monitor daily HL, CBC, and for s/sx of bleeding   Thank you for allowing pharmacy to participate in this patient's care,  Sherron Monday, PharmD, BCCCP Clinical Pharmacist  Phone: [phone] 05/12/2023 2:22 PM  Please check AMION for all [facility] phone numbers After 10:00 PM, call Main Pharmacy [phone]

## 2023-05-12 NOTE — Progress Notes (Signed)
 Pre-CABG vascular exams have been completed.   Results can be found under chart review under CV PROC. 05/12/2023 4:54 PM Shantil Vallejo RVT, RDMS

## 2023-05-12 NOTE — Progress Notes (Signed)
 CARDIAC REHAB PHASE I      Pre-op OHS education including OHS booklet, OHS handout, IS use, mobility importance, home needs at discharge and sternal precautions/move in the tube reviewed. Started Spanish OHS video for pt. All questions and concerns addressed. Will continue to follow.    Woodroe Chen, RN BSN 05/12/2023 11:58 AM

## 2023-05-12 NOTE — Progress Notes (Signed)
 PROGRESS NOTE  [name] FIE:[id number] DOB: 07-Mar-1961   PCP: Donell Beers, FNP  Patient is from: Home.  DOA: 05/10/2023 LOS: 2  Chief complaints Chief Complaint  Patient presents with   Abdominal Pain   Chest Pain     Brief Narrative / Interim history: 63 year old male with history of diabetes type 2, hypertension, hyperlipidemia and tobacco use who presented from home with complaint of abdominal pain radiating to the chest.  He reported pain starting in the right lower quadrant and but it radiates to right side of his chest.  On presentation, troponin was elevated in the range of 3000.  EKG showed T wave inversions in V4, V5.  Case was discussed with cardiology.  Given aspirin, started on IV heparin.    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 TTE with LVEF of 45 to 50%, RWMA, G1 DD.  Remains on IV heparin.  Plan for CABG on 05/11/2023.  Subjective: Seen and examined earlier this morning.  No major events overnight of this morning.  No complaints other than chronic right leg pain that looks like radiculopathy.  This has been going on for about a year.  No acute change.  He has problem with his left knee and left leg and depends on his right leg for most part.  Objective: Vitals:   05/11/23 1830 05/11/23 2019 05/12/23 0016 05/12/23 0454  BP: 105/79 117/72 98/69 111/73  Pulse: 86     Resp:  16 17 18   Temp:  98.3 F (36.8 C) 98.9 F (37.2 C) 98.5 F (36.9 C)  TempSrc:  Oral Oral Oral  SpO2: 99%     Weight:      Height:        Examination:  GENERAL: No apparent distress.  Nontoxic. HEENT: MMM.  Vision and hearing grossly intact.  NECK: Supple.  No apparent JVD.  RESP:  No IWOB.  Fair aeration bilaterally. CVS:  RRR. Heart sounds normal.  ABD/GI/GU: BS+. Abd soft, NTND.  MSK/EXT:  Moves extremities. No apparent deformity. No edema.  SKIN: no apparent skin lesion or wound NEURO: Awake, alert and oriented appropriately.  No apparent focal neuro deficit. PSYCH: Calm.  Normal affect.   Procedures:  2/20-LHC with multivessel CAD  Microbiology summarized: None.  Assessment and plan: NSTEMI/multivessel CAD: Presented with abdominal pain radiating to chest.  Has significant risk factors: Diabetes, hypertension, hyperlipidemia, tobacco use.  Elevated troponin on presentation with EKG changes.  LHC with multivessel CAD.  TTE with LVEF of 45 to 50%, RWMA, G1DD.  A1c 6.7%.  LDL 104.  TSH normal.  Currently without cardiopulmonary symptoms.   -Remains on IV heparin, metoprolol, low-dose aspirin and Lipitor.   -Plan for CABG on 05/15/2023.   -Aggressive risk reductions.   -Encouraged smoking cessation.  -Follow lipoprotein a.  Chronic HFmrEF: TTE as above.  Appears compensated.  Not on diuretics at home. -Strict intake and output, daily weight, renal functions and electrolytes -GDMT per cardiology  NIDDM-2 with hyperglycemia: Elevated CBG at 9 AM.  I do not think this is preprandial.  A1c 6.7%. Recent Labs  Lab 05/11/23 1632 05/11/23 2036 05/12/23 0014 05/12/23 0409 05/12/23 0909  GLUCAP 141* 233* 138* 145* 235*  -Continue SSI-moderate -Continue holding metformin -He may benefit from SGLT2 inhibitors  History of hypertension: On lisinopril 2.5 mg daily at home.   -Continue metoprolol   Hyperlipidemia: Lipid panel showed LDL of 104.   -Continue Lipitor 80 mg daily   PAD: Follows-up with outpatient vascular surgery.  No signs of symptoms of ischemic disease currently.  -Continue aspirin and Lipitor.  Tobacco use disorder: Reports smoking 2 cigarettes a day. -Encouraged smoking cessation   Body mass index is 28.34 kg/m.          DVT prophylaxis:  On full dose anticoagulation.  Code Status: Full code Family Communication: None at bedside Level of care: Telemetry Cardiac Status is: Inpatient Remains inpatient appropriate because: Non-STEMI/multivessel CAD   Final disposition: Home once medically cleared Consultants:   Cardiology Cardiothoracic surgery  55 minutes with more than 50% spent in reviewing records, counseling patient/family and coordinating care.   Sch Meds:  Scheduled Meds:  aspirin EC  81 mg Oral Daily   atorvastatin  80 mg Oral Daily   gabapentin  300 mg Oral TID   insulin aspart  0-15 Units Subcutaneous Q4H   metoprolol tartrate  25 mg Oral BID   sodium chloride flush  3 mL Intravenous Q12H   Continuous Infusions:  sodium chloride     heparin 1,200 Units/hr (05/12/23 0600)   PRN Meds:.sodium chloride, acetaminophen, nitroGLYCERIN, ondansetron (ZOFRAN) IV, sodium chloride flush  Antimicrobials: Anti-infectives (From admission, onward)    None        I have personally reviewed the following labs and images: CBC: Recent Labs  Lab 05/10/23 1153 05/11/23 0551 05/12/23 0348  WBC 7.0 7.5 7.9  HGB 15.9 13.7 14.2  HCT 47.3 40.9 41.6  MCV 93.8 93.2 91.6  PLT 222 182 210   BMP &GFR Recent Labs  Lab 05/10/23 1153 05/12/23 0348  NA 137 136  K 4.0 3.6  CL 103 103  CO2 26 22  GLUCOSE 124* 122*  BUN 16 18  CREATININE 0.69 0.74  CALCIUM 9.7 9.1   Estimated Creatinine Clearance: 83.4 mL/min (by C-G formula based on SCr of 0.74 mg/dL). Liver & Pancreas: Recent Labs  Lab 05/10/23 1153  AST 29  ALT 20  ALKPHOS 93  BILITOT 1.6*  PROT 8.0  ALBUMIN 4.3   Recent Labs  Lab 05/10/23 1153  LIPASE 35   No results for input(s): "AMMONIA" in the last 168 hours. Diabetic: Recent Labs    05/10/23 1153  HGBA1C 6.7*   Recent Labs  Lab 05/11/23 1632 05/11/23 2036 05/12/23 0014 05/12/23 0409 05/12/23 0909  GLUCAP 141* 233* 138* 145* 235*   Cardiac Enzymes: No results for input(s): "CKTOTAL", "CKMB", "CKMBINDEX", "TROPONINI" in the last 168 hours. No results for input(s): "PROBNP" in the last 8760 hours. Coagulation Profile: Recent Labs  Lab 05/10/23 1540  INR 1.0   Thyroid Function Tests: Recent Labs    05/12/23 0348  TSH 1.362   Lipid  Profile: Recent Labs    05/11/23 0551  CHOL 145  HDL 24*  LDLCALC 104*  TRIG 85  CHOLHDL 6.0   Anemia Panel: No results for input(s): "VITAMINB12", "FOLATE", "FERRITIN", "TIBC", "IRON", "RETICCTPCT" in the last 72 hours. Urine analysis:    Component Value Date/Time   COLORURINE YELLOW 05/10/2023 1153   APPEARANCEUR CLEAR 05/10/2023 1153   LABSPEC 1.012 05/10/2023 1153   PHURINE 6.0 05/10/2023 1153   GLUCOSEU NEGATIVE 05/10/2023 1153   HGBUR NEGATIVE 05/10/2023 1153   BILIRUBINUR NEGATIVE 05/10/2023 1153   BILIRUBINUR negative 12/16/2022 0922   KETONESUR NEGATIVE 05/10/2023 1153   PROTEINUR NEGATIVE 05/10/2023 1153   UROBILINOGEN 1.0 12/16/2022 0922   UROBILINOGEN 0.2 05/10/2010 0858   NITRITE NEGATIVE 05/10/2023 1153   LEUKOCYTESUR NEGATIVE 05/10/2023 1153   Sepsis Labs: Invalid input(s): "PROCALCITONIN", "LACTICIDVEN"  Microbiology: No  results found for this or any previous visit (from the past 240 hours).  Radiology Studies: ECHOCARDIOGRAM COMPLETE Result Date: 05/12/2023    ECHOCARDIOGRAM REPORT   Patient Name:   [name]  Date of Exam: 05/12/2023 Medical Rec #:  [id number]  Height:       63.0 in Accession #:    [id number] Weight:       160.0 lb Date of Birth:  1961/03/16  BSA:          1.759 m Patient Age:    62 years   BP:           118/80 mmHg Patient Gender: M          HR:           73 bpm. Exam Location:  Inpatient Procedure: 2D Echo, Cardiac Doppler and Color Doppler (Both Spectral and Color            Flow Doppler were utilized during procedure). Indications:    Myocardial Infact I21,9  History:        Patient has no prior history of Echocardiogram examinations.                 Acute MI, PAD; Risk Factors:Hypertension, Diabetes and Current                 Smoker.  Sonographer:    Webb Laws Referring Phys: 23 MICHAEL COOPER IMPRESSIONS  1. Left ventricular ejection fraction, by estimation, is 45 to 50%. The left ventricle has mildly decreased function. The left  ventricle demonstrates regional wall motion abnormalities (see scoring diagram/findings for description). There is mild concentric left ventricular hypertrophy. Left ventricular diastolic parameters are consistent with Grade I diastolic dysfunction (impaired relaxation).  2. Prominent trabeculation at LV apex. Definity contrast demonstrates slow flow and loosely organized mural thrombus at anteroapex as well.  3. Right ventricular systolic function is normal. The right ventricular size is normal. Tricuspid regurgitation signal is inadequate for assessing PA pressure.  4. The mitral valve is grossly normal. Trivial mitral valve regurgitation.  5. The aortic valve is tricuspid. Aortic valve regurgitation is not visualized. No aortic stenosis is present.  6. The inferior vena cava is normal in size with <50% respiratory variability, suggesting right atrial pressure of 8 mmHg. Comparison(s): No prior Echocardiogram. FINDINGS  Left Ventricle: Left ventricular ejection fraction, by estimation, is 45 to 50%. The left ventricle has mildly decreased function. The left ventricle demonstrates regional wall motion abnormalities. Strain imaging was not performed. The left ventricular  internal cavity size was normal in size. There is mild concentric left ventricular hypertrophy. Left ventricular diastolic parameters are consistent with Grade I diastolic dysfunction (impaired relaxation).  LV Wall Scoring: The apical septal segment, apical inferior segment, and apex are akinetic. The mid anteroseptal segment, apical lateral segment, mid inferoseptal segment, apical anterior segment, and mid inferior segment are hypokinetic. The anterior wall, antero-lateral wall, posterior wall, basal anteroseptal segment, basal inferior segment, and basal inferoseptal segment are normal. Right Ventricle: The right ventricular size is normal. No increase in right ventricular wall thickness. Right ventricular systolic function is normal. Tricuspid  regurgitation signal is inadequate for assessing PA pressure. Left Atrium: Left atrial size was normal in size. Right Atrium: Right atrial size was normal in size. Pericardium: There is no evidence of pericardial effusion. Mitral Valve: The mitral valve is grossly normal. Trivial mitral valve regurgitation. Tricuspid Valve: The tricuspid valve is grossly normal. Tricuspid valve regurgitation is trivial. Aortic Valve: The  aortic valve is tricuspid. Aortic valve regurgitation is not visualized. No aortic stenosis is present. Pulmonic Valve: The pulmonic valve was grossly normal. Pulmonic valve regurgitation is trivial. Aorta: The aortic root and ascending aorta are structurally normal, with no evidence of dilitation. Venous: The inferior vena cava is normal in size with less than 50% respiratory variability, suggesting right atrial pressure of 8 mmHg. IAS/Shunts: No atrial level shunt detected by color flow Doppler. Additional Comments: 3D imaging was not performed.  LEFT VENTRICLE PLAX 2D LVIDd:         5.00 cm      Diastology LVIDs:         3.10 cm      LV e' medial:    6.53 cm/s LV PW:         1.30 cm      LV E/e' medial:  7.7 LV IVS:        1.20 cm      LV e' lateral:   5.98 cm/s LVOT diam:     2.10 cm      LV E/e' lateral: 8.4 LV SV:         60 LV SV Index:   34 LVOT Area:     3.46 cm  LV Volumes (MOD) LV vol d, MOD A2C: 126.0 ml LV vol d, MOD A4C: 128.0 ml LV vol s, MOD A2C: 49.8 ml LV vol s, MOD A4C: 73.5 ml LV SV MOD A2C:     76.2 ml LV SV MOD A4C:     128.0 ml LV SV MOD BP:      70.9 ml RIGHT VENTRICLE             IVC RV Basal diam:  4.00 cm     IVC diam: 2.10 cm RV Mid diam:    2.50 cm RV S prime:     13.10 cm/s TAPSE (M-mode): 1.8 cm LEFT ATRIUM             Index        RIGHT ATRIUM           Index LA diam:        3.40 cm 1.93 cm/m   RA Area:     12.60 cm LA Vol (A2C):   32.8 ml 18.65 ml/m  RA Volume:   33.10 ml  18.82 ml/m LA Vol (A4C):   30.2 ml 17.17 ml/m LA Biplane Vol: 31.4 ml 17.85 ml/m  AORTIC  VALVE LVOT Vmax:   85.50 cm/s LVOT Vmean:  58.500 cm/s LVOT VTI:    0.174 m  AORTA Ao Root diam: 3.40 cm Ao Asc diam:  3.40 cm MITRAL VALVE MV Area (PHT): 4.21 cm    SHUNTS MV Decel Time: 180 msec    Systemic VTI:  0.17 m MV E velocity: 50.40 cm/s  Systemic Diam: 2.10 cm MV A velocity: 51.80 cm/s MV E/A ratio:  0.97 Nona Dell MD Electronically signed by Nona Dell MD Signature Date/Time: 05/12/2023/10:26:51 AM    Final    CARDIAC CATHETERIZATION Result Date: 05/11/2023 1.  Patent left main with mild diffuse nonobstructive plaquing of up to 30% 2.  Severe ostial and proximal LAD stenosis of 75% with critical LAD stenosis at the first diagonal origin of 95% (lesion involves a large diagonal) 3.  Severe stenosis of a small first OM branch of the circumflex and moderately severe stenosis of the distal circumflex 4.  Patent, dominant RCA with mild nonobstructive plaquing 5.  Moderate segmental LV  dysfunction with periapical and inferoapical severe hypokinesis, LVEF estimated at 45%. Recommendations: Resume IV heparin, obtain cardiac surgical consultation for consideration of CABG.  The patient has significant stenosis involving the ostium of the LAD as well as disease involving the LAD/diagonal bifurcation.      Montez Stryker T. Adine Heimann [facility]  If 7PM-7AM, please contact night-coverage [email or web address] 05/12/2023, 11:11 AM

## 2023-05-12 NOTE — Progress Notes (Signed)
 Rounding Note    Patient Name: [name] Date of Encounter: 05/12/2023  [facility] Cardiologist: None  Chief Complaint: chest pain  Reason of consult: NSTEMI   Subjective   Resting in bed comfortably. Denies chest pain, orthopnea, PND, lower extremity swelling. Remains on heparin drip.  Inpatient Medications    Scheduled Meds:  aspirin EC  81 mg Oral Daily   atorvastatin  80 mg Oral Daily   gabapentin  300 mg Oral TID   insulin aspart  0-15 Units Subcutaneous Q4H   metoprolol tartrate  25 mg Oral BID   sodium chloride flush  3 mL Intravenous Q12H   Continuous Infusions:  sodium chloride     heparin 1,200 Units/hr (05/12/23 0600)   PRN Meds: sodium chloride, acetaminophen, nitroGLYCERIN, ondansetron (ZOFRAN) IV, sodium chloride flush   Vital Signs    Vitals:   05/11/23 1830 05/11/23 2019 05/12/23 0016 05/12/23 0454  BP: 105/79 117/72 98/69 111/73  Pulse: 86     Resp:  16 17 18   Temp:  98.3 F (36.8 C) 98.9 F (37.2 C) 98.5 F (36.9 C)  TempSrc:  Oral Oral Oral  SpO2: 99%     Weight:      Height:        Intake/Output Summary (Last 24 hours) at 05/12/2023 0918 Last data filed at 05/12/2023 0455 Gross per 24 hour  Intake 253.79 ml  Output 900 ml  Net -646.21 ml      05/10/2023   11:43 AM 12/16/2022    8:21 AM 09/14/2022    3:46 PM  Last 3 Weights  Weight (lbs) 160 lb 150 lb 9.6 oz 147 lb 12.8 oz  Weight (kg) 72.576 kg 68.312 kg 67.042 kg      Telemetry    Sinus rhythm without ectopy- Personally Reviewed  ECG    No new tracings- Personally Reviewed  Physical Exam   Physical Exam  Constitutional:  Age appropriate, hemodynamically stable, no acute distress. Ambulates w/ cane.   Neck: No JVD present.  Cardiovascular: Normal rate, regular rhythm, S1 normal and S2 normal. Exam reveals no gallop and no friction rub.  No murmur heard. Pulmonary/Chest: Breath sounds normal. He has no wheezes. He has no rales. He exhibits no tenderness.   Abdominal: Soft. Bowel sounds are normal. He exhibits no distension. There is no abdominal tenderness.  Musculoskeletal:        General: No tenderness or edema.  Skin: Skin is warm and moist.    Labs    High Sensitivity Troponin:   Recent Labs  Lab 05/10/23 1423 05/10/23 1727  TROPONINIHS 3,456* 3,333*     Chemistry Recent Labs  Lab 05/10/23 1153 05/12/23 0348  NA 137 136  K 4.0 3.6  CL 103 103  CO2 26 22  GLUCOSE 124* 122*  BUN 16 18  CREATININE 0.69 0.74  CALCIUM 9.7 9.1  PROT 8.0  --   ALBUMIN 4.3  --   AST 29  --   ALT 20  --   ALKPHOS 93  --   BILITOT 1.6*  --   GFRNONAA >60 >60  ANIONGAP 8 11    Lipids  Recent Labs  Lab 05/11/23 0551  CHOL 145  TRIG 85  HDL 24*  LDLCALC 104*  CHOLHDL 6.0    Hematology Recent Labs  Lab 05/10/23 1153 05/11/23 0551 05/12/23 0348  WBC 7.0 7.5 7.9  RBC 5.04 4.39 4.54  HGB 15.9 13.7 14.2  HCT 47.3 40.9 41.6  MCV  93.8 93.2 91.6  MCH 31.5 31.2 31.3  MCHC 33.6 33.5 34.1  RDW 12.8 12.9 12.9  PLT 222 182 210   Thyroid  Recent Labs  Lab 05/12/23 0348  TSH 1.362    BNPNo results for input(s): "BNP", "PROBNP" in the last 168 hours.  DDimer No results for input(s): "DDIMER" in the last 168 hours.   Radiology    NA  Cardiac Studies   Echocardiogram: Pending.  Left heart catheterization 05/11/2023 1.  Patent left main with mild diffuse nonobstructive plaquing of up to 30% 2.  Severe ostial and proximal LAD stenosis of 75% with critical LAD stenosis at the first diagonal origin of 95% (lesion involves a large diagonal) 3.  Severe stenosis of a small first OM branch of the circumflex and moderately severe stenosis of the distal circumflex 4.  Patent, dominant RCA with mild nonobstructive plaquing 5.  Moderate segmental LV dysfunction with periapical and inferoapical severe hypokinesis, LVEF estimated at 45%.   Recommendations: Resume IV heparin, obtain cardiac surgical consultation for consideration of CABG.   The patient has significant stenosis involving the ostium of the LAD as well as disease involving the LAD/diagonal bifurcation.  Vascular Doppler pre-CABG: Pending  Patient Profile     63 y.o. male DM, HLD with hypertriglyceridemia, HTN, tobacco abuse, normal ABIs 05/2022 (saw VVS not requiring further evaluation), arthritis   Assessment & Plan    NSTEMI Multivessel CAD Presented to the hospital with cardiac discomfort concerning for anginal discomfort. Index event Monday, 05/08/2023 High sensitive troponins peaked at 3456 EKG noted sinus rhythm with dynamic changes Underwent left heart catheterization yesterday 05/11/2023 noted to have multivessel CAD recommended surgical revascularization. Will be seen by cardiothoracic surgery later today Echocardiogram pending. Vascular ultrasound pre-CABG pending. Remains on IV heparin drip for ACS protocol. Continue aspirin 81 mg p.o. daily.   Continue high intensity statin-Lipitor 80 mg p.o. daily No chest pain. Telemetry does not demonstrate arrhythmia.  Non-insulin-dependent diabetes mellitus type 2: Hemoglobin A1c 6.7.  Remains on insulin sliding scale.  Reemphasized importance of glycemic control.  Continue current management  Hyperlipidemia: LDL-C 104. Remains on atorvastatin 80 mg p.o. daily Recommend a goal LDL <55 mg/dL  Cigarette smoking: Currently smokes 2 cigarettes/day. Reemphasized importance of complete cessation.  For questions or updates, please contact [facility] Please consult [email or web address] for contact info under    Signed, Tessa Lerner, DO, [facility]  [facility]  [address] [place], [place] [address] Pager: [phone] Office: [phone] 05/12/2023, 9:18 AM

## 2023-05-12 NOTE — Consult Note (Addendum)
 [address]       [place] [address]             [phone]        [name] [facility] Medical Record #[id number] Date of Birth: 1960/04/11  Referring: Tonny Bollman, MD  Primary Care: Donell Beers, FNP Primary Cardiologist:None  Reason for Consult: Evaluation for coronary bypass grafting after presenting with acute non-ST elevation myocardial infarction.   History of Present Illness: [name] is a 63 year old gentleman with a past medical history notable for hypertension, type 2 diabetes mellitus, dyslipidemia, and tobacco use.  He had no prior cardiac history.  He presented to the [facility] emergency room on Wednesday, 05/10/2023 after having chest pain with associated abdominal pain several times over the previous 2 days.  He denied having any nausea, vomiting, or diaphoresis.  Once he arrived to the emergency room, he was not having any chest or abdominal pain and has not had any discomfort since.  Evaluation in the ED included an EKG that showed sinus rhythm with evidence of an anterior MI, age undetermined.  The high-sensitivity troponin level was greater than 3400.  A chest x-ray and CT of the abdomen and pelvis were unrevealing.  [name] was treated with aspirin and started on a heparin infusion for suspected acute non-ST elevation myocardial infarction.  He was transferred to [facility] for further evaluation.  He has remained stable since his initial presentation.  He underwent left heart catheterization yesterday demonstrating severe two-vessel coronary artery disease with a high-grade stenoses involving the ostium and proximal LAD as well as the first diagonal.  Additionally, there was severe stenosis of a small first OM branch of the circumflex and moderately severe stenosis of the distal circumflex.  He was dominant with mild nonobstructive disease.  Left ventriculogram showed severe inferoapical and periapical hypokinesis with a left  ventricular ejection fraction of 45%.  CT surgery has been asked asked to evaluate for consideration of coronary bypass grafting.  [name] is currently resting in bed with IV heparin infusing.  He denies any chest discomfort or shortness of breath.  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  He is married but separated from his wife.  He has several family members in the area that he said would be able to help him in recovery phase after surgery.  He has not visited a dentist in several years and tells me he has some loose teeth on the upper and lower jaw.  He denies any previous surgeries.   Current Activity/ Functional Status: Patient is independent with mobility/ambulation, transfers, ADL's, IADL's.   Zubrod Score: At the time of surgery this patient's most appropriate activity status/level should be described as: []     0    Normal activity, no symptoms [x]     1    Restricted in physical strenuous activity but ambulatory, able to do out light work []     2    Ambulatory and capable of self care, unable to do work activities, up and about                 more than 50%  Of the time                            []     3    Only limited self care, in bed greater than 50%  of waking hours []     4    Completely disabled, no self care, confined to bed or chair []     5    Moribund  Past Medical History:  Diagnosis Date   Arthritis of left knee    Cervical stenosis of spine    Diabetes mellitus without complication (HCC)    Hyperlipidemia LDL goal <70    Primary hypertension 02/07/2022   Tobacco use     Past Surgical History:  Procedure Laterality Date   LEFT HEART CATH AND CORONARY ANGIOGRAPHY N/A 05/11/2023   Procedure: LEFT HEART CATH AND CORONARY ANGIOGRAPHY;  Surgeon: Tonny Bollman, MD;  Location: [facility] INVASIVE CV LAB;  Service: Cardiovascular;  Laterality: N/A;    Social History   Tobacco Use  Smoking Status Every Day   Current packs/day: 0.25   Types: Cigarettes   Smokeless Tobacco Current    Social History   Substance and Sexual Activity  Alcohol Use Yes   Alcohol/week: 2.0 standard drinks of alcohol   Types: 2 Cans of beer per week   Comment: occas     No Known Allergies  Current Facility-Administered Medications  Medication Dose Route Frequency Provider Last Rate Last Admin   0.9 %  sodium chloride infusion  250 mL Intravenous PRN Tonny Bollman, MD       acetaminophen (TYLENOL) tablet 650 mg  650 mg Oral Q6H PRN Tonny Bollman, MD       aspirin EC tablet 81 mg  81 mg Oral Daily Tonny Bollman, MD       atorvastatin (LIPITOR) tablet 80 mg  80 mg Oral Daily Tonny Bollman, MD   80 mg at 05/11/23 1029   gabapentin (NEURONTIN) capsule 300 mg  300 mg Oral TID Tonny Bollman, MD   300 mg at 05/11/23 2029   heparin ADULT infusion 100 units/mL (25000 units/232mL)  1,200 Units/hr Intravenous Continuous Stevphen Rochester, RPH 12 mL/hr at 05/12/23 0600 1,200 Units/hr at 05/12/23 0600   insulin aspart (novoLOG) injection 0-15 Units  0-15 Units Subcutaneous Q4H Tonny Bollman, MD   3 Units at 05/12/23 0410   metoprolol tartrate (LOPRESSOR) tablet 25 mg  25 mg Oral BID Tonny Bollman, MD   25 mg at 05/11/23 2029   nitroGLYCERIN (NITROSTAT) SL tablet 0.4 mg  0.4 mg Sublingual Q5 Min x 3 PRN Tonny Bollman, MD       ondansetron [facility]) injection 4 mg  4 mg Intravenous Q6H PRN Tonny Bollman, MD       sodium chloride flush (NS) 0.9 % injection 3 mL  3 mL Intravenous Q12H Tonny Bollman, MD       sodium chloride flush (NS) 0.9 % injection 3 mL  3 mL Intravenous PRN Tonny Bollman, MD        Medications Prior to Admission  Medication Sig Dispense Refill Last Dose/Taking   aspirin EC 81 MG tablet Take 162 mg by mouth daily. Swallow whole.   05/09/2023 at 10:00 AM   atorvastatin (LIPITOR) 40 MG tablet Take 1 tablet (40 mg total) by mouth daily. 90 tablet 1 05/09/2023   gabapentin (NEURONTIN) 300 MG capsule Take 1 capsule (300 mg total) by mouth 3  (three) times daily. (Patient taking differently: Take 300 mg by mouth 3 (three) times daily. Take 300 mg by mouth two to three times a day) 90 capsule 3 05/09/2023   metFORMIN (GLUCOPHAGE) 1000 MG tablet Take 1 tablet (1,000 mg total) by mouth 2 (two) times daily with a meal. 120 tablet  2 05/09/2023   ibuprofen (ADVIL) 600 MG tablet Take 1 tablet (600 mg total) by mouth every 8 (eight) hours as needed. (Patient not taking: Reported on 05/10/2023) 30 tablet 1 Not Taking   lisinopril (ZESTRIL) 2.5 MG tablet Take 1 tablet (2.5 mg total) by mouth daily. (Patient not taking: Reported on 05/10/2023) 30 tablet 2 Not Taking    Family History  Problem Relation Age of Onset   Diabetes Father    Colon cancer Neg Hx    Prostate cancer Neg Hx      Review of Systems:       Cardiac Review of Systems: Y or  [    ]= no  Chest Pain [present prior to admission]  Resting SOB [   ] Exertional SOB  [  ]  Orthopnea [  ]   Pedal Edema [   ]    Palpitations [  ] Syncope  [  ]   Presyncope [   ]  General Review of Systems: [Y] = yes [  ]=no Constitional: recent weight change [  ]; anorexia [  ]; fatigue [  ]; nausea [  ]; night sweats [  ]; fever [  ]; or chills [  ]                                                               Dental: Last Dentist visit:   Eye : blurred vision [  ]; diplopia [   ]; vision changes [  ];  Amaurosis fugax[  ]; Resp: cough [  ];  wheezing[  ];  hemoptysis[  ]; shortness of breath[  ]; paroxysmal nocturnal dyspnea[  ]; dyspnea on exertion[  ]; or orthopnea[  ];  GI:  gallstones[  ], vomiting[  ];  dysphagia[  ]; melena[  ];  hematochezia [  ]; heartburn[  ];   Hx of  Colonoscopy[  ]; GU: kidney stones [  ]; hematuria[  ];   dysuria [  ];  nocturia[  ];  history of     obstruction [  ]; urinary frequency [  ]             Skin: rash, swelling[  ];, hair loss[  ];  peripheral edema[  ];  or itching[  ]; Musculosketetal: myalgias[  ];  joint swelling[  ];  joint erythema[  ];  joint pain[   ];  back pain[  ];  Heme/Lymph: bruising[  ];  bleeding[  ];  anemia[  ];  Neuro: TIA[  ];  headaches[  ];  stroke[  ];  vertigo[  ];  seizures[  ];   paresthesias[  ];  difficulty walking[  ];  Psych:depression[  ]; anxiety[  ];  Endocrine: diabetes [type 2 diabetes mellitus];  thyroid dysfunction[  ];               Physical Exam: BP 111/73 (BP Location: Left Arm)   Pulse 86   Temp 98.5 F (36.9 C) (Oral)   Resp 18   Ht 5\' 3"  (1.6 m)   Wt 72.6 kg   SpO2 99%   BMI 28.34 kg/m    General appearance: alert, cooperative, and no distress Head: Normocephalic, without obvious abnormality, atraumatic Neck: no adenopathy, no  carotid bruit, no JVD, and supple, symmetrical, trachea midline Lymph nodes: No cervical or clavicular adenopathy Resp: Normal work of breathing on room air.  Breath sounds are clear to auscultation Cardio: Regular rate and rhythm, no murmur.  Monitor shows normal sinus rhythm. GI: Active bowel sounds, soft and nontender.  No palpable masses Extremities: No deformities.  All well-perfused with no peripheral edema. Neurologic: Grossly normal  Diagnostic Studies & Laboratory data:    CARDIAC CATHETERIZATION   LEFT HEART CATH AND CORONARY ANGIOGRAPHY   Conclusion  1.  Patent left main with mild diffuse nonobstructive plaquing of up to 30% 2.  Severe ostial and proximal LAD stenosis of 75% with critical LAD stenosis at the first diagonal origin of 95% (lesion involves a large diagonal) 3.  Severe stenosis of a small first OM branch of the circumflex and moderately severe stenosis of the distal circumflex 4.  Patent, dominant RCA with mild nonobstructive plaquing 5.  Moderate segmental LV dysfunction with periapical and inferoapical severe hypokinesis, LVEF estimated at 45%.   Recommendations: Resume IV heparin, obtain cardiac surgical consultation for consideration of CABG.  The patient has significant stenosis involving the ostium of the LAD as well as disease  involving the LAD/diagonal bifurcation.  Coronary Findings  Diagnostic Dominance: Right Left Main  Ost LM to Dist LM lesion is 30% stenosed.    Left Anterior Descending  Ost LAD to Prox LAD lesion is 75% stenosed.  Prox LAD to Mid LAD lesion is 95% stenosed.  Mid LAD lesion is 50% stenosed.    First Diagonal Branch  1st Diag lesion is 75% stenosed.    Left Circumflex  Mid Cx to Dist Cx lesion is 70% stenosed.    First Obtuse Marginal Branch  1st Mrg lesion is 85% stenosed.    Right Coronary Artery  There is mild diffuse disease throughout the vessel. Dominant vessel, mild nonobstructive plaquing without any significant stenoses. The PDA and PLA branches are all patent with no significant stenoses.    Right Posterior Descending Artery  There is mild disease in the vessel.    Intervention   No interventions have been documented.   Wall Motion              Left Heart  Left Ventricle The left ventricular size is normal. There is moderate left ventricular systolic dysfunction. LV end diastolic pressure is normal. The left ventricular ejection fraction is 35-45% by visual estimate. There are LV function abnormalities due to segmental dysfunction.   Coronary Diagrams  Diagnostic Dominance: Right    Recent Radiology Findings:   DG Chest Port 1 View Result Date: 05/10/2023 CLINICAL DATA:  Chest pain. EXAM: PORTABLE CHEST 1 VIEW COMPARISON:  February 03, 2013. FINDINGS: The heart size and mediastinal contours are within normal limits. Both lungs are clear. The visualized skeletal structures are unremarkable. IMPRESSION: No active disease. Electronically Signed   By: Lupita Raider M.D.   On: 05/10/2023 16:37   CT ABDOMEN PELVIS WO CONTRAST Result Date: 05/10/2023 CLINICAL DATA:  Abdominal pain. EXAM: CT ABDOMEN AND PELVIS WITHOUT CONTRAST TECHNIQUE: Multidetector CT imaging of the abdomen and pelvis was performed following the standard protocol without IV contrast.  RADIATION DOSE REDUCTION: This exam was performed according to the departmental dose-optimization program which includes automated exposure control, adjustment of the mA and/or kV according to patient size and/or use of iterative reconstruction technique. COMPARISON:  CT abdomen pelvis dated 09/06/2022. FINDINGS: Evaluation of this exam is limited in the absence of  intravenous contrast. Lower chest: Bibasilar subpleural atelectasis/scarring. No intra-abdominal free air or free fluid. Hepatobiliary: The liver is unremarkable. No biliary dilatation. The gallbladder is unremarkable. Pancreas: Unremarkable. No pancreatic ductal dilatation or surrounding inflammatory changes. Spleen: Normal in size without focal abnormality. Adrenals/Urinary Tract: The adrenal glands unremarkable. There is no hydronephrosis on limited physis on either side. The visualized ureters and urinary bladder appear unremarkable. Stomach/Bowel: There is sigmoid diverticulosis and scattered colonic diverticula. There is moderate stool throughout the colon. No bowel obstruction or active inflammation. The appendix is normal. Vascular/Lymphatic: Mild aortoiliac atherosclerotic disease. The IVC is unremarkable. No portal venous gas. There is no adenopathy. Reproductive: The prostate and seminal vesicles are grossly unremarkable. Other: Small fat containing left inguinal hernia. Musculoskeletal: Osteopenia with degenerative changes of the spine. Multilevel disc desiccation and vacuum phenomena. No acute osseous pathology.   IMPRESSION: 1. No acute intra-abdominal or pelvic pathology. 2. Colonic diverticulosis.  No bowel obstruction. Normal appendix. 3.  Aortic Atherosclerosis (ICD10-I70.0). Electronically Signed   By: Elgie Collard M.D.   On: 05/10/2023 15:28     I have independently reviewed the above radiologic studies and discussed with the patient   Recent Lab Findings: Lab Results  Component Value Date   WBC 7.9 05/12/2023   HGB 14.2  05/12/2023   HCT 41.6 05/12/2023   PLT 210 05/12/2023   GLUCOSE 122 (H) 05/12/2023   CHOL 145 05/11/2023   TRIG 85 05/11/2023   HDL 24 (L) 05/11/2023   LDLCALC 104 (H) 05/11/2023   ALT 20 05/10/2023   AST 29 05/10/2023   NA 136 05/12/2023   K 3.6 05/12/2023   CL 103 05/12/2023   CREATININE 0.74 05/12/2023   BUN 18 05/12/2023   CO2 22 05/12/2023   TSH 1.362 05/12/2023   INR 1.0 05/10/2023   HGBA1C 6.7 (H) 05/10/2023      Assessment / Plan:      -Multivessel coronary artery disease: 63 year old male with multiple risk factors including hypertension, type 2 diabetes mellitus, dyslipidemia, and tobacco use with likely late presentation after acute non-ST elevation myocardial infarction in the anterior apical distribution earlier this week.  He did have severe ostial and mid LAD stenoses along with high-grade stenosis in the first diagonal coronary artery.  He has an 85% stenosis in proximal OM1 which is a small vessel and a 70% stenosis in the distal circumflex.  There is no significant obstructive disease in the RCA.  Left ventricular ejection fraction is estimated at 45% on LV gram.  The echocardiogram is pending.  We agree that coronary bypass grafting is his best option for revascularization.  We discussed the operation and expected perioperative course.  His questions were answered.  We have tentatively scheduled [name] for Monday, 05/15/2023 for coronary bypass grafting by Dr. Dorris Fetch.  -Type 2 diabetes mellitus: With peripheral neuropathy treated with gabapentin, A1c was 6.7 this admission.  On metformin prior to admission for the diabetes  -Hypertension: Prescribed low-dose lisinopril prior to admission but was not taking  -Dyslipidemia: On atorvastatin, not clear if he was taking this as prescribed prior to admission.  -Current smoker: Counseled regarding the relationship of the use of tobacco products with vascular disease and advised to quit  I  spent 30 minutes  counseling the patient face to face.  Leary Roca, PA-C  05/12/2023 9:07 AM  I have seen and examined, [name] and reviewed his cath and echo images.  Used professional interpreter for discussion of surgery and risks. Pilar #  [id number]  63 yo man with multiple cardiac risk factors presented with abdominal/ chest pain.   Abdominal w/u negative, r/I for non-STEMI.  Cath showed severe 2 vessel CAD.  CABG indicated for survival benefit and relief of symptoms.    We discussed the general nature of the procedure, including the need for general anesthesia, the incisions to be used, the use of cardiopulmonary bypass, and the use of temporary pacemaker wires and drainage tubes postoperatively.  We discussed the expected hospital stay, overall recovery and short and long term outcomes.  I informed him of the indications, risks, benefits and alternatives.  He understands the risks include, but are not limited to death, stroke, MI, DVT/PE, bleeding, possible need for transfusion, infections, cardiac arrhythmias, as well as other organ system dysfunction including respiratory, renal, or GI complications.   He wishes to talk with his family before making a final decision.  Tentatively plan for CABG Monday AM.  Salvatore Decent. Dorris Fetch, MD [facility] [phone]

## 2023-05-13 DIAGNOSIS — I2510 Atherosclerotic heart disease of native coronary artery without angina pectoris: Secondary | ICD-10-CM

## 2023-05-13 LAB — GLUCOSE, CAPILLARY
Glucose-Capillary: 116 mg/dL — ABNORMAL HIGH (ref 70–99)
Glucose-Capillary: 145 mg/dL — ABNORMAL HIGH (ref 70–99)
Glucose-Capillary: 135 mg/dL — ABNORMAL HIGH (ref 70–99)
Glucose-Capillary: 113 mg/dL — ABNORMAL HIGH (ref 70–99)
Glucose-Capillary: 172 mg/dL — ABNORMAL HIGH (ref 70–99)

## 2023-05-13 LAB — CBC
HCT: 38.9 % — ABNORMAL LOW (ref 39.0–52.0)
Hemoglobin: 13.3 g/dL (ref 13.0–17.0)
MCH: 31.2 pg (ref 26.0–34.0)
MCHC: 34.2 g/dL (ref 30.0–36.0)
MCV: 91.3 fL (ref 80.0–100.0)
Platelets: 192 10*3/uL (ref 150–400)
RBC: 4.26 MIL/uL (ref 4.22–5.81)
RDW: 12.8 % (ref 11.5–15.5)
WBC: 8.3 10*3/uL (ref 4.0–10.5)
nRBC: 0 % (ref 0.0–0.2)

## 2023-05-13 LAB — RENAL FUNCTION PANEL
Albumin: 3.1 g/dL — ABNORMAL LOW (ref 3.5–5.0)
Anion gap: 11 (ref 5–15)
BUN: 20 mg/dL (ref 8–23)
CO2: 23 mmol/L (ref 22–32)
Calcium: 9.3 mg/dL (ref 8.9–10.3)
Chloride: 106 mmol/L (ref 98–111)
Creatinine, Ser: 0.67 mg/dL (ref 0.61–1.24)
GFR, Estimated: >60 mL/min (ref >60)
Glucose, Bld: 112 mg/dL — ABNORMAL HIGH (ref 70–99)
Phosphorus: 3.5 mg/dL (ref 2.5–4.6)
Potassium: 3.6 mmol/L (ref 3.5–5.1)
Sodium: 140 mmol/L (ref 135–145)

## 2023-05-13 LAB — HEPARIN LEVEL (UNFRACTIONATED): Heparin Unfractionated: 0.45 [IU]/mL (ref 0.30–0.70)

## 2023-05-13 LAB — MAGNESIUM: Magnesium: 2 mg/dL (ref 1.7–2.4)

## 2023-05-13 MED ORDER — INSULIN ASPART 100 UNIT/ML IJ SOLN
0.0000 [IU] | Freq: Three times a day (TID) | INTRAMUSCULAR | Status: DC
  Administered 2023-05-14 (×2): 5 [IU] via SUBCUTANEOUS
  Administered 2023-05-14 – 2023-05-15 (×2): 2 [IU] via SUBCUTANEOUS

## 2023-05-13 MED ORDER — INSULIN ASPART 100 UNIT/ML IJ SOLN: 0.0000 [IU] | Freq: Every day | INTRAMUSCULAR | Status: DC

## 2023-05-13 NOTE — Progress Notes (Signed)
 Rounding Note    Patient Name: [name] Date of Encounter: 05/13/2023  [facility] Cardiologist: None  Chief Complaint: chest pain  Reason of consult: NSTEMI   Subjective   No events overnight. Evaluate CT surgery yesterday 05/12/2023 Denies chest pain, orthopnea, PND, lower extremity swelling. Remains on heparin drip.  Inpatient Medications    Scheduled Meds:  aspirin EC  81 mg Oral Daily   atorvastatin  80 mg Oral Daily   [START ON 05/15/2023] epinephrine  0-10 mcg/min Intravenous To OR   gabapentin  300 mg Oral TID   [START ON 05/15/2023] heparin sodium (porcine) 2,500 Units, papaverine 30 mg in electrolyte-A (PLASMALYTE-A PH 7.4) 500 mL irrigation   Irrigation To OR   insulin aspart  0-15 Units Subcutaneous TID WC   insulin aspart  0-5 Units Subcutaneous QHS   [START ON 05/15/2023] insulin   Intravenous To OR   [START ON 05/15/2023] magnesium sulfate  40 mEq Other To OR   metoprolol tartrate  25 mg Oral BID   [START ON 05/15/2023] phenylephrine  30-200 mcg/min Intravenous To OR   [START ON 05/15/2023] potassium chloride  80 mEq Other To OR   sodium chloride flush  3 mL Intravenous Q12H   [START ON 05/15/2023] tranexamic acid  15 mg/kg Intravenous To OR   [START ON 05/15/2023] tranexamic acid  2 mg/kg Intracatheter To OR   Continuous Infusions:  [START ON 05/15/2023]  ceFAZolin (ANCEF) IV     [START ON 05/15/2023]  ceFAZolin (ANCEF) IV     [START ON 05/15/2023] dexmedetomidine     [START ON 05/15/2023] heparin 30,000 units/NS 1000 mL solution for CELLSAVER     heparin 1,200 Units/hr (05/12/23 1759)   [START ON 05/15/2023] milrinone     [START ON 05/15/2023] nitroGLYCERIN     [START ON 05/15/2023] norepinephrine     [START ON 05/15/2023] tranexamic acid (CYKLOKAPRON) 2,500 mg in sodium chloride 0.9 % 250 mL (10 mg/mL) infusion     [START ON 05/15/2023] vancomycin     PRN Meds: acetaminophen, nitroGLYCERIN, ondansetron (ZOFRAN) IV, sodium chloride flush   Vital Signs     Vitals:   05/12/23 2144 05/13/23 0410 05/13/23 0413 05/13/23 0446  BP: 107/64 (!) 93/53 105/65 101/68  Pulse: 81 70 71 67  Resp:  20 18 20   Temp:  98.6 F (37 C) 98.6 F (37 C) 98.2 F (36.8 C)  TempSrc:  Oral Oral Oral  SpO2:  96% 100% 98%  Weight:      Height:        Intake/Output Summary (Last 24 hours) at 05/13/2023 1209 Last data filed at 05/13/2023 0411 Gross per 24 hour  Intake 964.11 ml  Output 1400 ml  Net -435.89 ml      05/10/2023   11:43 AM 12/16/2022    8:21 AM 09/14/2022    3:46 PM  Last 3 Weights  Weight (lbs) 160 lb 150 lb 9.6 oz 147 lb 12.8 oz  Weight (kg) 72.576 kg 68.312 kg 67.042 kg      Telemetry    Sinus rhythm without ectopy- Personally Reviewed  ECG    No new tracings- Personally Reviewed  Physical Exam   Physical Exam  Constitutional:  Age appropriate, hemodynamically stable, no acute distress. Ambulates w/ cane.   Neck: No JVD present.  Cardiovascular: Normal rate, regular rhythm, S1 normal and S2 normal. Exam reveals no gallop and no friction rub.  No murmur heard. Pulmonary/Chest: Breath sounds normal. He has no wheezes. He  has no rales. He exhibits no tenderness.  Abdominal: Soft. Bowel sounds are normal. He exhibits no distension. There is no abdominal tenderness.  Musculoskeletal:        General: No tenderness or edema.  Skin: Skin is warm and moist.    Labs    High Sensitivity Troponin:   Recent Labs  Lab 05/10/23 1423 05/10/23 1727  TROPONINIHS 3,456* 3,333*     Chemistry Recent Labs  Lab 05/10/23 1153 05/12/23 0348 05/13/23 0322  NA 137 136 140  K 4.0 3.6 3.6  CL 103 103 106  CO2 26 22 23   GLUCOSE 124* 122* 112*  BUN 16 18 20   CREATININE 0.69 0.74 0.67  CALCIUM 9.7 9.1 9.3  MG  --   --  2.0  PROT 8.0  --   --   ALBUMIN 4.3  --  3.1*  AST 29  --   --   ALT 20  --   --   ALKPHOS 93  --   --   BILITOT 1.6*  --   --   GFRNONAA >60 >60 >60  ANIONGAP 8 11 11     Lipids  Recent Labs  Lab 05/11/23 0551   CHOL 145  TRIG 85  HDL 24*  LDLCALC 104*  CHOLHDL 6.0    Hematology Recent Labs  Lab 05/11/23 0551 05/12/23 0348 05/13/23 0322  WBC 7.5 7.9 8.3  RBC 4.39 4.54 4.26  HGB 13.7 14.2 13.3  HCT 40.9 41.6 38.9*  MCV 93.2 91.6 91.3  MCH 31.2 31.3 31.2  MCHC 33.5 34.1 34.2  RDW 12.9 12.9 12.8  PLT 182 210 192   Thyroid  Recent Labs  Lab 05/12/23 0348  TSH 1.362    BNPNo results for input(s): "BNP", "PROBNP" in the last 168 hours.  DDimer No results for input(s): "DDIMER" in the last 168 hours.   Radiology    NA  Cardiac Studies   Echocardiogram:  05/12/2023  1. Left ventricular ejection fraction, by estimation, is 45 to 50%. The left ventricle has mildly decreased function. The left ventricle demonstrates regional wall motion abnormalities (see scoring diagram/findings for description). There is mild concentric left ventricular hypertrophy. Left ventricular diastolic parameters are consistent with Grade I diastolic dysfunction (impaired relaxation).   2. Prominent trabeculation at LV apex. Definity contrast demonstrates slow flow and loosely organized mural thrombus at anteroapex as well.   3. Right ventricular systolic function is normal. The right ventricular size is normal. Tricuspid regurgitation signal is inadequate for assessing PA pressure.   4. The mitral valve is grossly normal. Trivial mitral valve regurgitation.   5. The aortic valve is tricuspid. Aortic valve regurgitation is not visualized. No aortic stenosis is present.   6. The inferior vena cava is normal in size with <50% respiratory variability, suggesting right atrial pressure of 8 mmHg.   Left heart catheterization 05/11/2023 1.  Patent left main with mild diffuse nonobstructive plaquing of up to 30% 2.  Severe ostial and proximal LAD stenosis of 75% with critical LAD stenosis at the first diagonal origin of 95% (lesion involves a large diagonal) 3.  Severe stenosis of a small first OM branch of the  circumflex and moderately severe stenosis of the distal circumflex 4.  Patent, dominant RCA with mild nonobstructive plaquing 5.  Moderate segmental LV dysfunction with periapical and inferoapical severe hypokinesis, LVEF estimated at 45%.   Recommendations: Resume IV heparin, obtain cardiac surgical consultation for consideration of CABG.  The patient has significant stenosis involving  the ostium of the LAD as well as disease involving the LAD/diagonal bifurcation.  Vascular Doppler pre-CABG: 05/12/2023 Right Carotid: The extracranial vessels were near-normal with only minimal wall thickening or plaque.   Left Carotid: The extracranial vessels were near-normal with only minimal wall thickening or plaque.  Vertebrals:  Bilateral vertebral arteries demonstrate antegrade flow.  Subclavians: Normal flow hemodynamics were seen in bilateral subclavian arteries.   Right ABI: Resting right ankle-brachial index is within normal range. The right toe-brachial index is abnormal.  Left ABI: Resting left ankle-brachial index is within normal range. The left toe-brachial index is abnormal.   Bilateral Extremity: Doppler waveforms remain within normal limits with compression bilaterally for the radial arteries. Doppler waveforms remain within normal limits with compression bilaterally for the ulnar arteries.   Patient Profile     63 y.o. male DM, HLD with hypertriglyceridemia, HTN, tobacco abuse, normal ABIs 05/2022 (saw VVS not requiring further evaluation), arthritis   Assessment & Plan    NSTEMI Multivessel CAD Presented to the hospital with cardiac discomfort concerning for anginal discomfort. Index event Monday, 05/08/2023 High sensitive troponins peaked at 3456 EKG noted sinus rhythm with dynamic changes Underwent left heart catheterization yesterday 05/11/2023 noted to have multivessel CAD recommended surgical revascularization. Cardiothoracic surgery evaluated the patient on  05/12/2023-recommended CABG on 05/15/2023. Echocardiogram results independently reviewed. Vascular ultrasound pre-CABG reviewed.  Remains on IV heparin drip for ACS protocol. Continue aspirin 81 mg p.o. daily.   Continue high intensity statin-Lipitor 80 mg p.o. daily Telemetry does not demonstrate arrhythmia.  LV thrombus: Currently on IV heparin drip. Likely secondary to underlying CAD -given the regional wall motion normality.  Non-insulin-dependent diabetes mellitus type 2: Hemoglobin A1c 6.7.  Remains on insulin sliding scale.  Reemphasized importance of glycemic control.  Continue current management  Hyperlipidemia: LDL-C 104. Remains on atorvastatin 80 mg p.o. daily Recommend a goal LDL <55 mg/dL  Cigarette smoking: Currently smokes 2 cigarettes/day. Reemphasized importance of complete cessation.  Patient requested that I speak to and update his best friend Chase Picket.  I spoke to him over the other questions and concerns addressed.  For questions or updates, please contact [facility] Please consult [email or web address] for contact info under    Signed, Tessa Lerner, DO, [facility]  [address] [place], [place] [address] Pager: [phone] Office: [phone] 05/13/2023, 12:09 PM

## 2023-05-13 NOTE — Progress Notes (Signed)
 ANTICOAGULATION CONSULT NOTE  Pharmacy Consult for heparin Indication: chest pain/ACS, s/p cath, getting CVTS consult  No Known Allergies  Patient Measurements: Height: 5\' 3"  (160 cm) Weight: 72.6 kg (160 lb) IBW/kg (Calculated) : 56.9 Heparin Dosing Weight: 72 kg   Vital Signs: Temp: 98.2 F (36.8 C) (02/22 0446) Temp Source: Oral (02/22 0446) BP: 101/68 (02/22 0446) Pulse Rate: 67 (02/22 0446)  Labs: Recent Labs    05/10/23 1153 05/10/23 1423 05/10/23 1540 05/10/23 1727 05/10/23 2229 05/11/23 0551 05/11/23 1509 05/12/23 0348 05/12/23 1346 05/13/23 0322  HGB 15.9  --   --   --   --  13.7  --  14.2  --  13.3  HCT 47.3  --   --   --   --  40.9  --  41.6  --  38.9*  PLT 222  --   --   --   --  182  --  210  --  192  APTT  --   --  26  --   --   --   --   --   --   --   LABPROT  --   --  13.2  --   --   --   --   --   --   --   INR  --   --  1.0  --   --   --   --   --   --   --   HEPARINUNFRC  --   --   --   --    < > 0.31   < > 0.22* 0.43 0.45  CREATININE 0.69  --   --   --   --   --   --  0.74  --  0.67  TROPONINIHS  --  3,456*  --  3,333*  --   --   --   --   --   --    < > = values in this interval not displayed.    Estimated Creatinine Clearance: 83.4 mL/min (by C-G formula based on SCr of 0.67 mg/dL).  Medical History: Past Medical History:  Diagnosis Date   Arthritis of left knee    Cervical stenosis of spine    Diabetes mellitus without complication (HCC)    Hyperlipidemia LDL goal <70    Primary hypertension 02/07/2022   Tobacco use     Medications:  See MAR  Assessment: 63 yo male presented to [facility] ED 2/19 with episodic abdominal and radiating chest pain with elevated troponins and EKG changes transferred to [facility] for LHC.  Not on anticoagulation prior to admission.  Pharmacy consulted for heparin dosing.  Cath revealing multi-vessel CAD, TCTS consulted for CABG consideration. Of note, ECHO finding mural thrombus at anteroapex.    Heparin level  therapeutic at 0.45 on 1200 units/hr. Hgb/plt wnl. No signs of bleeding or issues with infusion noted.   Goal of Therapy:  Heparin level 0.3-0.7 units/ml Monitor platelets by anticoagulation protocol: Yes   Plan:  Continue heparin infusion at 1200 units/hr Monitor daily HL, CBC, and for s/sx of bleeding   Thank you for involving pharmacy in the patient's care.   Theotis Burrow, PharmD PGY1 Acute Care Pharmacy Resident  05/13/2023 7:02 AM

## 2023-05-13 NOTE — Progress Notes (Signed)
 PROGRESS NOTE  [name] WUJ:[id number] DOB: 22-Nov-1960   PCP: Donell Beers, FNP  Patient is from: Home.  DOA: 05/10/2023 LOS: 3  Chief complaints Chief Complaint  Patient presents with   Abdominal Pain   Chest Pain     Brief Narrative / Interim history: 63 year old male with history of diabetes type 2, hypertension, hyperlipidemia and tobacco use who presented from home with complaint of abdominal pain radiating to the chest.  He reported pain starting in the right lower quadrant and but it radiates to right side of his chest.  On presentation, troponin was elevated in the range of 3000.  EKG showed T wave inversions in V4, V5.  Case was discussed with cardiology.  Given aspirin, started on IV heparin.    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 TTE with LVEF of 45 to 50%, RWMA, G1 DD and possible LV thrombus..  Remains on IV heparin.  Plan for CABG on 05/15/2023.  Subjective: Seen and examined earlier this morning.  No major events overnight of this morning.  No complaints.  Objective: Vitals:   05/12/23 2144 05/13/23 0410 05/13/23 0413 05/13/23 0446  BP: 107/64 (!) 93/53 105/65 101/68  Pulse: 81 70 71 67  Resp:  20 18 20   Temp:  98.6 F (37 C) 98.6 F (37 C) 98.2 F (36.8 C)  TempSrc:  Oral Oral Oral  SpO2:  96% 100% 98%  Weight:      Height:        Examination:  GENERAL: No apparent distress.  Nontoxic. HEENT: MMM.  Vision and hearing grossly intact.  NECK: Supple.  No apparent JVD.  RESP:  No IWOB.  Fair aeration bilaterally. CVS:  RRR. Heart sounds normal.  ABD/GI/GU: BS+. Abd soft, NTND.  MSK/EXT:  Moves extremities. No apparent deformity. No edema.  SKIN: no apparent skin lesion or wound NEURO: Awake, alert and oriented appropriately.  No apparent focal neuro deficit. PSYCH: Calm. Normal affect.   Procedures:  2/20-LHC with multivessel CAD  Microbiology summarized: None.  Assessment and plan: NSTEMI/multivessel CAD: Presented with abdominal pain  radiating to chest.  Has significant risk factors: Diabetes, hypertension, hyperlipidemia, tobacco use.  Elevated troponin on presentation with EKG changes.  LHC with multivessel CAD.  TTE with LVEF of 45 to 50%, RWMA, G1DD and possible LV thrombus..  A1c 6.7%.  LDL 104.  TSH normal.  Currently without cardiopulmonary symptoms.   -Remains on IV heparin, metoprolol, low-dose aspirin and Lipitor.   -Plan for CABG on 05/15/2023.   -Aggressive risk reductions.   -Encouraged smoking cessation.  -Follow lipoprotein a.  Chronic HFmrEF: TTE as above.  Appears compensated.  Not on diuretics at home. -Strict intake and output, daily weight, renal functions and electrolytes -GDMT per cardiology  LV mural thrombus: -Continue IV heparin  NIDDM-2 with hyperglycemia: Elevated CBG at 9 AM.  I do not think this is preprandial.  A1c 6.7%. Recent Labs  Lab 05/12/23 2009 05/12/23 2339 05/13/23 0406 05/13/23 0718 05/13/23 1209  GLUCAP 134* 151* 116* 145* 135*  -Continue SSI-moderate.  Changed CBG monitoring to ACHS. -Continue holding metformin -He may benefit from SGLT2 inhibitors  History of hypertension: On lisinopril 2.5 mg daily at home.   -Continue metoprolol   Hyperlipidemia: Lipid panel showed LDL of 104.   -Continue Lipitor 80 mg daily   PAD: Follows-up with outpatient vascular surgery.  No signs of symptoms of ischemic disease currently.  -Continue aspirin and Lipitor.  Tobacco use disorder: Reports smoking 2 cigarettes a  day. -Encouraged smoking cessation  Right leg pain/radiculopathy: Chronic.  No acute change -Continue home gabapentin   Body mass index is 28.34 kg/m.          DVT prophylaxis:  On full dose anticoagulation.  Code Status: Full code Family Communication: None at bedside Level of care: Telemetry Cardiac Status is: Inpatient Remains inpatient appropriate because: Non-STEMI/multivessel CAD   Final disposition: Home once medically cleared Consultants:   Cardiology Cardiothoracic surgery  35 minutes with more than 50% spent in reviewing records, counseling patient/family and coordinating care.   Sch Meds:  Scheduled Meds:  aspirin EC  81 mg Oral Daily   atorvastatin  80 mg Oral Daily   [START ON 05/15/2023] epinephrine  0-10 mcg/min Intravenous To OR   gabapentin  300 mg Oral TID   [START ON 05/15/2023] heparin sodium (porcine) 2,500 Units, papaverine 30 mg in electrolyte-A (PLASMALYTE-A PH 7.4) 500 mL irrigation   Irrigation To OR   insulin aspart  0-15 Units Subcutaneous TID WC   insulin aspart  0-5 Units Subcutaneous QHS   [START ON 05/15/2023] insulin   Intravenous To OR   [START ON 05/15/2023] magnesium sulfate  40 mEq Other To OR   metoprolol tartrate  25 mg Oral BID   [START ON 05/15/2023] phenylephrine  30-200 mcg/min Intravenous To OR   [START ON 05/15/2023] potassium chloride  80 mEq Other To OR   sodium chloride flush  3 mL Intravenous Q12H   [START ON 05/15/2023] tranexamic acid  15 mg/kg Intravenous To OR   [START ON 05/15/2023] tranexamic acid  2 mg/kg Intracatheter To OR   Continuous Infusions:  [START ON 05/15/2023]  ceFAZolin (ANCEF) IV     [START ON 05/15/2023]  ceFAZolin (ANCEF) IV     [START ON 05/15/2023] dexmedetomidine     [START ON 05/15/2023] heparin 30,000 units/NS 1000 mL solution for CELLSAVER     heparin 1,200 Units/hr (05/12/23 1759)   [START ON 05/15/2023] milrinone     [START ON 05/15/2023] nitroGLYCERIN     [START ON 05/15/2023] norepinephrine     [START ON 05/15/2023] tranexamic acid (CYKLOKAPRON) 2,500 mg in sodium chloride 0.9 % 250 mL (10 mg/mL) infusion     [START ON 05/15/2023] vancomycin     PRN Meds:.acetaminophen, nitroGLYCERIN, ondansetron (ZOFRAN) IV, sodium chloride flush  Antimicrobials: Anti-infectives (From admission, onward)    Start     Dose/Rate Route Frequency Ordered Stop   05/15/23 0400  vancomycin (VANCOREADY) IVPB 1250 mg/250 mL        1,250 mg 166.7 mL/hr over 90 Minutes  Intravenous To Surgery 05/12/23 1332 05/16/23 0400   05/15/23 0400  ceFAZolin (ANCEF) IVPB 2g/100 mL premix        2 g 200 mL/hr over 30 Minutes Intravenous To Surgery 05/12/23 1332 05/16/23 0400   05/15/23 0400  ceFAZolin (ANCEF) IVPB 2g/100 mL premix        2 g 200 mL/hr over 30 Minutes Intravenous To Surgery 05/12/23 1332 05/16/23 0400        I have personally reviewed the following labs and images: CBC: Recent Labs  Lab 05/10/23 1153 05/11/23 0551 05/12/23 0348 05/13/23 0322  WBC 7.0 7.5 7.9 8.3  HGB 15.9 13.7 14.2 13.3  HCT 47.3 40.9 41.6 38.9*  MCV 93.8 93.2 91.6 91.3  PLT 222 182 210 192   BMP &GFR Recent Labs  Lab 05/10/23 1153 05/12/23 0348 05/13/23 0322  NA 137 136 140  K 4.0 3.6 3.6  CL 103 103 106  CO2 26 22 23   GLUCOSE 124* 122* 112*  BUN 16 18 20   CREATININE 0.69 0.74 0.67  CALCIUM 9.7 9.1 9.3  MG  --   --  2.0  PHOS  --   --  3.5   Estimated Creatinine Clearance: 83.4 mL/min (by C-G formula based on SCr of 0.67 mg/dL). Liver & Pancreas: Recent Labs  Lab 05/10/23 1153 05/13/23 0322  AST 29  --   ALT 20  --   ALKPHOS 93  --   BILITOT 1.6*  --   PROT 8.0  --   ALBUMIN 4.3 3.1*   Recent Labs  Lab 05/10/23 1153  LIPASE 35   No results for input(s): "AMMONIA" in the last 168 hours. Diabetic: No results for input(s): "HGBA1C" in the last 72 hours.  Recent Labs  Lab 05/12/23 2009 05/12/23 2339 05/13/23 0406 05/13/23 0718 05/13/23 1209  GLUCAP 134* 151* 116* 145* 135*   Cardiac Enzymes: No results for input(s): "CKTOTAL", "CKMB", "CKMBINDEX", "TROPONINI" in the last 168 hours. No results for input(s): "PROBNP" in the last 8760 hours. Coagulation Profile: Recent Labs  Lab 05/10/23 1540  INR 1.0   Thyroid Function Tests: Recent Labs    05/12/23 0348  TSH 1.362   Lipid Profile: Recent Labs    05/11/23 0551  CHOL 145  HDL 24*  LDLCALC 104*  TRIG 85  CHOLHDL 6.0   Anemia Panel: No results for input(s): "VITAMINB12",  "FOLATE", "FERRITIN", "TIBC", "IRON", "RETICCTPCT" in the last 72 hours. Urine analysis:    Component Value Date/Time   COLORURINE YELLOW 05/10/2023 1153   APPEARANCEUR CLEAR 05/10/2023 1153   LABSPEC 1.012 05/10/2023 1153   PHURINE 6.0 05/10/2023 1153   GLUCOSEU NEGATIVE 05/10/2023 1153   HGBUR NEGATIVE 05/10/2023 1153   BILIRUBINUR NEGATIVE 05/10/2023 1153   BILIRUBINUR negative 12/16/2022 0922   KETONESUR NEGATIVE 05/10/2023 1153   PROTEINUR NEGATIVE 05/10/2023 1153   UROBILINOGEN 1.0 12/16/2022 0922   UROBILINOGEN 0.2 05/10/2010 0858   NITRITE NEGATIVE 05/10/2023 1153   LEUKOCYTESUR NEGATIVE 05/10/2023 1153   Sepsis Labs: Invalid input(s): "PROCALCITONIN", "LACTICIDVEN"  Microbiology: No results found for this or any previous visit (from the past 240 hours).  Radiology Studies: No results found.     Corban Kistler T. Alynn Ellithorpe [facility]  If 7PM-7AM, please contact night-coverage [email or web address] 05/13/2023, 2:24 PM

## 2023-05-14 ENCOUNTER — Inpatient Hospital Stay (HOSPITAL_COMMUNITY): Payer: MEDICAID

## 2023-05-14 LAB — CBC
HCT: 40.5 % (ref 39.0–52.0)
Hemoglobin: 13.8 g/dL (ref 13.0–17.0)
MCH: 31.3 pg (ref 26.0–34.0)
MCHC: 34.1 g/dL (ref 30.0–36.0)
MCV: 91.8 fL (ref 80.0–100.0)
Platelets: 205 10*3/uL (ref 150–400)
RBC: 4.41 MIL/uL (ref 4.22–5.81)
RDW: 12.9 % (ref 11.5–15.5)
WBC: 6.8 10*3/uL (ref 4.0–10.5)
nRBC: 0 % (ref 0.0–0.2)

## 2023-05-14 LAB — RENAL FUNCTION PANEL
Albumin: 3.3 g/dL — ABNORMAL LOW (ref 3.5–5.0)
Anion gap: 14 (ref 5–15)
BUN: 14 mg/dL (ref 8–23)
CO2: 21 mmol/L — ABNORMAL LOW (ref 22–32)
Calcium: 9.2 mg/dL (ref 8.9–10.3)
Chloride: 102 mmol/L (ref 98–111)
Creatinine, Ser: 0.67 mg/dL (ref 0.61–1.24)
GFR, Estimated: >60 mL/min (ref >60)
Glucose, Bld: 141 mg/dL — ABNORMAL HIGH (ref 70–99)
Phosphorus: 3.3 mg/dL (ref 2.5–4.6)
Potassium: 3.7 mmol/L (ref 3.5–5.1)
Sodium: 137 mmol/L (ref 135–145)

## 2023-05-14 LAB — GLUCOSE, CAPILLARY
Glucose-Capillary: 218 mg/dL — ABNORMAL HIGH (ref 70–99)
Glucose-Capillary: 127 mg/dL — ABNORMAL HIGH (ref 70–99)
Glucose-Capillary: 127 mg/dL — ABNORMAL HIGH (ref 70–99)
Glucose-Capillary: 197 mg/dL — ABNORMAL HIGH (ref 70–99)

## 2023-05-14 LAB — SURGICAL PCR SCREEN
MRSA, PCR: NEGATIVE
Staphylococcus aureus: NEGATIVE

## 2023-05-14 LAB — ABO/RH: ABO/RH(D): O POS

## 2023-05-14 LAB — TYPE AND SCREEN
ABO/RH(D): O POS
Antibody Screen: NEGATIVE

## 2023-05-14 LAB — HEPARIN LEVEL (UNFRACTIONATED): Heparin Unfractionated: 0.36 [IU]/mL (ref 0.30–0.70)

## 2023-05-14 LAB — MAGNESIUM: Magnesium: 2 mg/dL (ref 1.7–2.4)

## 2023-05-14 MED ORDER — CHLORHEXIDINE GLUCONATE CLOTH 2 % EX PADS
6.0000 | MEDICATED_PAD | Freq: Once | CUTANEOUS | Status: AC
  Administered 2023-05-14: 6 via TOPICAL

## 2023-05-14 MED ORDER — DIAZEPAM 5 MG PO TABS
5.0000 mg | ORAL_TABLET | Freq: Once | ORAL | Status: AC
  Administered 2023-05-15: 5 mg via ORAL
  Filled 2023-05-14: qty 1

## 2023-05-14 MED ORDER — METOPROLOL TARTRATE 12.5 MG HALF TABLET
12.5000 mg | ORAL_TABLET | Freq: Once | ORAL | Status: AC
  Administered 2023-05-15: 12.5 mg via ORAL
  Filled 2023-05-14: qty 1

## 2023-05-14 MED ORDER — BISACODYL 5 MG PO TBEC
5.0000 mg | DELAYED_RELEASE_TABLET | Freq: Once | ORAL | Status: AC
  Administered 2023-05-14: 5 mg via ORAL
  Filled 2023-05-14: qty 1

## 2023-05-14 MED ORDER — CHLORHEXIDINE GLUCONATE 0.12 % MT SOLN
15.0000 mL | Freq: Once | OROMUCOSAL | Status: AC
  Administered 2023-05-15: 15 mL via OROMUCOSAL
  Filled 2023-05-14: qty 15

## 2023-05-14 MED ORDER — POLYETHYLENE GLYCOL 3350 17 G PO PACK: 17.0000 g | PACK | Freq: Two times a day (BID) | ORAL | Status: DC | PRN

## 2023-05-14 MED ORDER — SENNOSIDES-DOCUSATE SODIUM 8.6-50 MG PO TABS: 1.0000 | ORAL_TABLET | Freq: Two times a day (BID) | ORAL | Status: DC | PRN

## 2023-05-14 NOTE — Progress Notes (Signed)
 ANTICOAGULATION CONSULT NOTE  Pharmacy Consult for heparin Indication: chest pain/ACS, s/p cath, getting CVTS consult  No Known Allergies  Patient Measurements: Height: 5\' 3"  (160 cm) Weight: 72.6 kg (160 lb) IBW/kg (Calculated) : 56.9 Heparin Dosing Weight: 72 kg   Vital Signs: Temp: 98 F (36.7 C) (02/23 0500) Temp Source: Oral (02/23 0500) BP: 108/64 (02/23 0500) Pulse Rate: 78 (02/23 0500)  Labs: Recent Labs    05/12/23 0348 05/12/23 1346 05/13/23 0322 05/14/23 0357  HGB 14.2  --  13.3 13.8  HCT 41.6  --  38.9* 40.5  PLT 210  --  192 205  HEPARINUNFRC 0.22* 0.43 0.45 0.36  CREATININE 0.74  --  0.67 0.67    Estimated Creatinine Clearance: 83.4 mL/min (by C-G formula based on SCr of 0.67 mg/dL).  Medical History: Past Medical History:  Diagnosis Date   Arthritis of left knee    Cervical stenosis of spine    Diabetes mellitus without complication (HCC)    Hyperlipidemia LDL goal <70    Primary hypertension 02/07/2022   Tobacco use     Medications:  See MAR  Assessment: 63 yo male presented to [facility] ED 2/19 with episodic abdominal and radiating chest pain with elevated troponins and EKG changes transferred to [facility] for LHC.  Not on anticoagulation prior to admission.  Pharmacy consulted for heparin dosing.  Cath revealing multi-vessel CAD, TCTS consulted for CABG consideration. Of note, ECHO finding mural thrombus at anteroapex.    Heparin level therapeutic at 0.36 on 1200 units/hr. Hgb/plt wnl. No signs of bleeding or issues with infusion noted.   Goal of Therapy:  Heparin level 0.3-0.7 units/ml Monitor platelets by anticoagulation protocol: Yes   Plan:  Continue heparin infusion at 1200 units/hr Monitor daily HL, CBC, and for s/sx of bleeding   Thank you for involving pharmacy in the patient's care.   Theotis Burrow, PharmD PGY1 Acute Care Pharmacy Resident  05/14/2023 6:41 AM

## 2023-05-14 NOTE — Plan of Care (Signed)
  Problem: Education: Goal: Ability to describe self-care measures that may prevent or decrease complications (Diabetes Survival Skills Education) will improve Outcome: Progressing Goal: Individualized Educational Video(s) Outcome: Progressing   Problem: Coping: Goal: Ability to adjust to condition or change in health will improve Outcome: Progressing   Problem: Fluid Volume: Goal: Ability to maintain a balanced intake and output will improve Outcome: Progressing   Problem: Health Behavior/Discharge Planning: Goal: Ability to identify and utilize available resources and services will improve Outcome: Progressing Goal: Ability to manage health-related needs will improve Outcome: Progressing   Problem: Metabolic: Goal: Ability to maintain appropriate glucose levels will improve Outcome: Progressing   Problem: Nutritional: Goal: Maintenance of adequate nutrition will improve Outcome: Progressing Goal: Progress toward achieving an optimal weight will improve Outcome: Progressing   Problem: Skin Integrity: Goal: Risk for impaired skin integrity will decrease Outcome: Progressing   Problem: Tissue Perfusion: Goal: Adequacy of tissue perfusion will improve Outcome: Progressing   Problem: Education: Goal: Understanding of cardiac disease, CV risk reduction, and recovery process will improve Outcome: Progressing Goal: Individualized Educational Video(s) Outcome: Progressing   Problem: Activity: Goal: Ability to tolerate increased activity will improve Outcome: Progressing   Problem: Cardiac: Goal: Ability to achieve and maintain adequate cardiovascular perfusion will improve Outcome: Progressing   Problem: Health Behavior/Discharge Planning: Goal: Ability to safely manage health-related needs after discharge will improve Outcome: Progressing   Problem: Education: Goal: Understanding of CV disease, CV risk reduction, and recovery process will improve Outcome:  Progressing Goal: Individualized Educational Video(s) Outcome: Progressing   Problem: Activity: Goal: Ability to return to baseline activity level will improve Outcome: Progressing   Problem: Cardiovascular: Goal: Ability to achieve and maintain adequate cardiovascular perfusion will improve Outcome: Progressing Goal: Vascular access site(s) Level 0-1 will be maintained Outcome: Progressing   Problem: Health Behavior/Discharge Planning: Goal: Ability to safely manage health-related needs after discharge will improve Outcome: Progressing   Problem: Education: Goal: Knowledge of General Education information will improve Description: Including pain rating scale, medication(s)/side effects and non-pharmacologic comfort measures Outcome: Progressing   Problem: Health Behavior/Discharge Planning: Goal: Ability to manage health-related needs will improve Outcome: Progressing   Problem: Clinical Measurements: Goal: Ability to maintain clinical measurements within normal limits will improve Outcome: Progressing Goal: Will remain free from infection Outcome: Progressing Goal: Diagnostic test results will improve Outcome: Progressing Goal: Respiratory complications will improve Outcome: Progressing Goal: Cardiovascular complication will be avoided Outcome: Progressing   Problem: Activity: Goal: Risk for activity intolerance will decrease Outcome: Progressing   Problem: Nutrition: Goal: Adequate nutrition will be maintained Outcome: Progressing   Problem: Coping: Goal: Level of anxiety will decrease Outcome: Progressing   Problem: Elimination: Goal: Will not experience complications related to bowel motility Outcome: Progressing Goal: Will not experience complications related to urinary retention Outcome: Progressing   Problem: Pain Managment: Goal: General experience of comfort will improve and/or be controlled Outcome: Progressing   Problem: Safety: Goal: Ability  to remain free from injury will improve Outcome: Progressing   Problem: Skin Integrity: Goal: Risk for impaired skin integrity will decrease Outcome: Progressing   Problem: Cardiovascular: Goal: Ability to achieve and maintain adequate cardiovascular perfusion will improve Outcome: Progressing Goal: Vascular access site(s) Level 0-1 will be maintained Outcome: Progressing

## 2023-05-14 NOTE — Progress Notes (Signed)
 PROGRESS NOTE  [name] ZOX:[id number] DOB: [birth year]   PCP: Donell Beers, FNP  Patient is from: Home.  DOA: 05/10/2023 LOS: 4  Chief complaints Chief Complaint  Patient presents with   Abdominal Pain   Chest Pain     Brief Narrative / Interim history: 63 year old male with history of diabetes type 2, hypertension, hyperlipidemia and tobacco use who presented from home with complaint of abdominal pain radiating to the chest.  He reported pain starting in the right lower quadrant and but it radiates to right side of his chest.  On presentation, troponin was elevated in the range of 3000.  EKG showed T wave inversions in V4, V5.  Case was discussed with cardiology.  Given aspirin, started on IV heparin.    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 TTE with LVEF of 45 to 50%, RWMA, G1 DD and possible LV thrombus..  Remains on IV heparin.  Plan for CABG on 05/15/2023.  Subjective: Seen and examined earlier this morning.  No major events overnight of this morning.  Walking in the hallway holding to IV drip stand.  Has no complaints.  Objective: Vitals:   05/13/23 2205 05/14/23 0500 05/14/23 0858 05/14/23 1122  BP: 117/73 108/64 114/71 102/69  Pulse: 67 78 80 67  Resp: 18 20  16   Temp: 97.9 F (36.6 C) 98 F (36.7 C)  (!) 97.4 F (36.3 C)  TempSrc: Oral Oral  Oral  SpO2:      Weight:      Height:        Examination:  GENERAL: No apparent distress.  Nontoxic. HEENT: MMM.  Vision and hearing grossly intact.  NECK: Supple.  No apparent JVD.  RESP:  No IWOB.  Fair aeration bilaterally. CVS:  RRR. Heart sounds normal.  ABD/GI/GU: BS+. Abd soft, NTND.  MSK/EXT:  Moves extremities. No apparent deformity. No edema.  SKIN: no apparent skin lesion or wound NEURO: Awake, alert and oriented appropriately.  No apparent focal neuro deficit. PSYCH: Calm. Normal affect.   Procedures:  2/20-LHC with multivessel CAD  Microbiology summarized: None.  Assessment and  plan: NSTEMI/multivessel CAD: Presented with abdominal pain radiating to chest.  Has significant risk factors: Diabetes, hypertension, hyperlipidemia, tobacco use.  Elevated troponin on presentation with EKG changes.  LHC with multivessel CAD.  TTE with LVEF of 45 to 50%, RWMA, G1DD and possible LV thrombus..  A1c 6.7%.  LDL 104.  TSH normal.  Currently without cardiopulmonary symptoms.   -Remains on IV heparin, metoprolol, low-dose aspirin and Lipitor.   -Plan for CABG on 05/15/2023.   -Aggressive risk reductions.   -Encouraged smoking cessation.  -Follow lipoprotein a.  Chronic HFmrEF: TTE as above.  Appears compensated.  Not on diuretics at home. -Strict intake and output, daily weight, renal functions and electrolytes -GDMT per cardiology  LV mural thrombus: -Continue IV heparin  NIDDM-2 with hyperglycemia: Elevated CBG at 9 AM.  I do not think this is preprandial.  A1c 6.7%. Recent Labs  Lab 05/13/23 1209 05/13/23 1658 05/13/23 2126 05/14/23 0737 05/14/23 1119  GLUCAP 135* 113* 172* 218* 127*  -Continue SSI-moderate.  Changed CBG monitoring to ACHS. -Continue holding metformin -He may benefit from SGLT2 inhibitors  History of hypertension: On lisinopril 2.5 mg daily at home.   -Continue metoprolol   Hyperlipidemia: Lipid panel showed LDL of 104.   -Continue Lipitor 80 mg daily   PAD: Follows-up with outpatient vascular surgery.  No signs of symptoms of ischemic disease currently.  -Continue aspirin  and Lipitor.  Tobacco use disorder: Reports smoking 2 cigarettes a day. -Encouraged smoking cessation  Right leg pain/radiculopathy: Chronic.  No acute change -Continue home gabapentin   Body mass index is 28.34 kg/m.          DVT prophylaxis:  On full dose anticoagulation.  Code Status: Full code Family Communication: None at bedside Level of care: Telemetry Cardiac Status is: Inpatient Remains inpatient appropriate because: Non-STEMI/multivessel  CAD   Final disposition: Home once medically cleared Consultants:  Cardiology Cardiothoracic surgery  35 minutes with more than 50% spent in reviewing records, counseling patient/family and coordinating care.   Sch Meds:  Scheduled Meds:  aspirin EC  81 mg Oral Daily   atorvastatin  80 mg Oral Daily   [START ON 05/15/2023] epinephrine  0-10 mcg/min Intravenous To OR   gabapentin  300 mg Oral TID   [START ON 05/15/2023] heparin sodium (porcine) 2,500 Units, papaverine 30 mg in electrolyte-A (PLASMALYTE-A PH 7.4) 500 mL irrigation   Irrigation To OR   insulin aspart  0-15 Units Subcutaneous TID WC   insulin aspart  0-5 Units Subcutaneous QHS   [START ON 05/15/2023] insulin   Intravenous To OR   [START ON 05/15/2023] magnesium sulfate  40 mEq Other To OR   metoprolol tartrate  25 mg Oral BID   [START ON 05/15/2023] phenylephrine  30-200 mcg/min Intravenous To OR   [START ON 05/15/2023] potassium chloride  80 mEq Other To OR   sodium chloride flush  3 mL Intravenous Q12H   [START ON 05/15/2023] tranexamic acid  15 mg/kg Intravenous To OR   [START ON 05/15/2023] tranexamic acid  2 mg/kg Intracatheter To OR   Continuous Infusions:  [START ON 05/15/2023]  ceFAZolin (ANCEF) IV     [START ON 05/15/2023]  ceFAZolin (ANCEF) IV     [START ON 05/15/2023] dexmedetomidine     [START ON 05/15/2023] heparin 30,000 units/NS 1000 mL solution for CELLSAVER     heparin 1,200 Units/hr (05/13/23 1701)   [START ON 05/15/2023] milrinone     [START ON 05/15/2023] nitroGLYCERIN     [START ON 05/15/2023] norepinephrine     [START ON 05/15/2023] tranexamic acid (CYKLOKAPRON) 2,500 mg in sodium chloride 0.9 % 250 mL (10 mg/mL) infusion     [START ON 05/15/2023] vancomycin     PRN Meds:.acetaminophen, nitroGLYCERIN, ondansetron (ZOFRAN) IV, polyethylene glycol, senna-docusate, sodium chloride flush  Antimicrobials: Anti-infectives (From admission, onward)    Start     Dose/Rate Route Frequency Ordered Stop   05/15/23  0400  vancomycin (VANCOREADY) IVPB 1250 mg/250 mL        1,250 mg 166.7 mL/hr over 90 Minutes Intravenous To Surgery 05/12/23 1332 05/16/23 0400   05/15/23 0400  ceFAZolin (ANCEF) IVPB 2g/100 mL premix        2 g 200 mL/hr over 30 Minutes Intravenous To Surgery 05/12/23 1332 05/16/23 0400   05/15/23 0400  ceFAZolin (ANCEF) IVPB 2g/100 mL premix        2 g 200 mL/hr over 30 Minutes Intravenous To Surgery 05/12/23 1332 05/16/23 0400        I have personally reviewed the following labs and images: CBC: Recent Labs  Lab 05/10/23 1153 05/11/23 0551 05/12/23 0348 05/13/23 0322 05/14/23 0357  WBC 7.0 7.5 7.9 8.3 6.8  HGB 15.9 13.7 14.2 13.3 13.8  HCT 47.3 40.9 41.6 38.9* 40.5  MCV 93.8 93.2 91.6 91.3 91.8  PLT 222 182 210 192 205   BMP &GFR Recent Labs  Lab  05/10/23 1153 05/12/23 0348 05/13/23 0322 05/14/23 0357  NA 137 136 140 137  K 4.0 3.6 3.6 3.7  CL 103 103 106 102  CO2 26 22 23  21*  GLUCOSE 124* 122* 112* 141*  BUN 16 18 20 14   CREATININE 0.69 0.74 0.67 0.67  CALCIUM 9.7 9.1 9.3 9.2  MG  --   --  2.0 2.0  PHOS  --   --  3.5 3.3   Estimated Creatinine Clearance: 83.4 mL/min (by C-G formula based on SCr of 0.67 mg/dL). Liver & Pancreas: Recent Labs  Lab 05/10/23 1153 05/13/23 0322 05/14/23 0357  AST 29  --   --   ALT 20  --   --   ALKPHOS 93  --   --   BILITOT 1.6*  --   --   PROT 8.0  --   --   ALBUMIN 4.3 3.1* 3.3*   Recent Labs  Lab 05/10/23 1153  LIPASE 35   No results for input(s): "AMMONIA" in the last 168 hours. Diabetic: No results for input(s): "HGBA1C" in the last 72 hours.  Recent Labs  Lab 05/13/23 1209 05/13/23 1658 05/13/23 2126 05/14/23 0737 05/14/23 1119  GLUCAP 135* 113* 172* 218* 127*   Cardiac Enzymes: No results for input(s): "CKTOTAL", "CKMB", "CKMBINDEX", "TROPONINI" in the last 168 hours. No results for input(s): "PROBNP" in the last 8760 hours. Coagulation Profile: Recent Labs  Lab 05/10/23 1540  INR 1.0    Thyroid Function Tests: Recent Labs    05/12/23 0348  TSH 1.362   Lipid Profile: No results for input(s): "CHOL", "HDL", "LDLCALC", "TRIG", "CHOLHDL", "LDLDIRECT" in the last 72 hours.  Anemia Panel: No results for input(s): "VITAMINB12", "FOLATE", "FERRITIN", "TIBC", "IRON", "RETICCTPCT" in the last 72 hours. Urine analysis:    Component Value Date/Time   COLORURINE YELLOW 05/10/2023 1153   APPEARANCEUR CLEAR 05/10/2023 1153   LABSPEC 1.012 05/10/2023 1153   PHURINE 6.0 05/10/2023 1153   GLUCOSEU NEGATIVE 05/10/2023 1153   HGBUR NEGATIVE 05/10/2023 1153   BILIRUBINUR NEGATIVE 05/10/2023 1153   BILIRUBINUR negative 12/16/2022 0922   KETONESUR NEGATIVE 05/10/2023 1153   PROTEINUR NEGATIVE 05/10/2023 1153   UROBILINOGEN 1.0 12/16/2022 0922   UROBILINOGEN 0.2 05/10/2010 0858   NITRITE NEGATIVE 05/10/2023 1153   LEUKOCYTESUR NEGATIVE 05/10/2023 1153   Sepsis Labs: Invalid input(s): "PROCALCITONIN", "LACTICIDVEN"  Microbiology: No results found for this or any previous visit (from the past 240 hours).  Radiology Studies: DG Chest 2 View Result Date: 05/14/2023 CLINICAL DATA:  5850 L3-4. Coronary artery disease with history of MI. EXAM: CHEST - 2 VIEW COMPARISON:  Portable chest 05/10/2023 FINDINGS: The heart size and mediastinal contours are within normal limits. Both lungs are mildly emphysematous but clear. The visualized skeletal structures are unremarkable. IMPRESSION: No evidence of acute chest disease. Emphysema. Electronically Signed   By: Almira Bar M.D.   On: 05/14/2023 07:44       Jayvian Escoe T. Jamyla Ard [facility]  If 7PM-7AM, please contact night-coverage [email or web address] 05/14/2023, 12:46 PM

## 2023-05-15 ENCOUNTER — Inpatient Hospital Stay (HOSPITAL_COMMUNITY): Payer: Self-pay | Admitting: Anesthesiology

## 2023-05-15 ENCOUNTER — Encounter (HOSPITAL_COMMUNITY)
Admission: EM | Disposition: A | Discharge status: Home / Self Care | Payer: Self-pay | Source: Home / Self Care | Attending: Thoracic Surgery (Cardiothoracic Vascular Surgery)

## 2023-05-15 ENCOUNTER — Inpatient Hospital Stay (HOSPITAL_COMMUNITY): Payer: MEDICAID

## 2023-05-15 ENCOUNTER — Other Ambulatory Visit: Payer: Self-pay

## 2023-05-15 ENCOUNTER — Inpatient Hospital Stay (HOSPITAL_COMMUNITY): Payer: MEDICAID | Admitting: Anesthesiology

## 2023-05-15 ENCOUNTER — Encounter (HOSPITAL_COMMUNITY): Payer: Self-pay | Admitting: Internal Medicine

## 2023-05-15 ENCOUNTER — Inpatient Hospital Stay (HOSPITAL_COMMUNITY): Payer: Self-pay

## 2023-05-15 DIAGNOSIS — E119 Type 2 diabetes mellitus without complications: Secondary | ICD-10-CM

## 2023-05-15 DIAGNOSIS — F17210 Nicotine dependence, cigarettes, uncomplicated: Secondary | ICD-10-CM

## 2023-05-15 DIAGNOSIS — I10 Essential (primary) hypertension: Secondary | ICD-10-CM

## 2023-05-15 DIAGNOSIS — Z951 Presence of aortocoronary bypass graft: Secondary | ICD-10-CM

## 2023-05-15 DIAGNOSIS — I2510 Atherosclerotic heart disease of native coronary artery without angina pectoris: Secondary | ICD-10-CM

## 2023-05-15 HISTORY — PX: TEE WITHOUT CARDIOVERSION: SHX5443

## 2023-05-15 HISTORY — PX: CORONARY ARTERY BYPASS GRAFT: SHX141

## 2023-05-15 LAB — CBC
HCT: 30.8 % — ABNORMAL LOW (ref 39.0–52.0)
HCT: 32.3 % — ABNORMAL LOW (ref 39.0–52.0)
HCT: 39.7 % (ref 39.0–52.0)
HCT: 40.5 % (ref 39.0–52.0)
Hemoglobin: 10.4 g/dL — ABNORMAL LOW (ref 13.0–17.0)
Hemoglobin: 10.8 g/dL — ABNORMAL LOW (ref 13.0–17.0)
Hemoglobin: 13.6 g/dL (ref 13.0–17.0)
Hemoglobin: 13.9 g/dL (ref 13.0–17.0)
MCH: 31.4 pg (ref 26.0–34.0)
MCH: 31.4 pg (ref 26.0–34.0)
MCH: 31.6 pg (ref 26.0–34.0)
MCH: 31.6 pg (ref 26.0–34.0)
MCHC: 33.4 g/dL (ref 30.0–36.0)
MCHC: 33.8 g/dL (ref 30.0–36.0)
MCHC: 34.3 g/dL (ref 30.0–36.0)
MCHC: 34.3 g/dL (ref 30.0–36.0)
MCV: 92 fL (ref 80.0–100.0)
MCV: 92.1 fL (ref 80.0–100.0)
MCV: 93.1 fL (ref 80.0–100.0)
MCV: 93.9 fL (ref 80.0–100.0)
Platelets: 121 10*3/uL — ABNORMAL LOW (ref 150–400)
Platelets: 122 10*3/uL — ABNORMAL LOW (ref 150–400)
Platelets: 187 10*3/uL (ref 150–400)
Platelets: 204 10*3/uL (ref 150–400)
RBC: 3.31 MIL/uL — ABNORMAL LOW (ref 4.22–5.81)
RBC: 3.44 MIL/uL — ABNORMAL LOW (ref 4.22–5.81)
RBC: 4.31 MIL/uL (ref 4.22–5.81)
RBC: 4.4 MIL/uL (ref 4.22–5.81)
RDW: 12.8 % (ref 11.5–15.5)
RDW: 12.9 % (ref 11.5–15.5)
RDW: 12.9 % (ref 11.5–15.5)
RDW: 12.9 % (ref 11.5–15.5)
WBC: 6.4 10*3/uL (ref 4.0–10.5)
WBC: 6.7 10*3/uL (ref 4.0–10.5)
WBC: 6.9 10*3/uL (ref 4.0–10.5)
WBC: 7.7 10*3/uL (ref 4.0–10.5)
nRBC: 0 % (ref 0.0–0.2)
nRBC: 0 % (ref 0.0–0.2)
nRBC: 0 % (ref 0.0–0.2)
nRBC: 0 % (ref 0.0–0.2)

## 2023-05-15 LAB — GLUCOSE, CAPILLARY
Glucose-Capillary: 123 mg/dL — ABNORMAL HIGH (ref 70–99)
Glucose-Capillary: 115 mg/dL — ABNORMAL HIGH (ref 70–99)
Glucose-Capillary: 71 mg/dL (ref 70–99)
Glucose-Capillary: 78 mg/dL (ref 70–99)
Glucose-Capillary: 97 mg/dL (ref 70–99)
Glucose-Capillary: 106 mg/dL — ABNORMAL HIGH (ref 70–99)
Glucose-Capillary: 93 mg/dL (ref 70–99)
Glucose-Capillary: 103 mg/dL — ABNORMAL HIGH (ref 70–99)
Glucose-Capillary: 124 mg/dL — ABNORMAL HIGH (ref 70–99)

## 2023-05-15 LAB — POCT I-STAT 7, (LYTES, BLD GAS, ICA,H+H)
Acid-Base Excess: 0 mmol/L (ref 0.0–2.0)
Acid-Base Excess: 0 mmol/L (ref 0.0–2.0)
Acid-base deficit: 4 mmol/L — ABNORMAL HIGH (ref 0.0–2.0)
Acid-base deficit: 1 mmol/L (ref 0.0–2.0)
Acid-base deficit: 4 mmol/L — ABNORMAL HIGH (ref 0.0–2.0)
Acid-base deficit: 5 mmol/L — ABNORMAL HIGH (ref 0.0–2.0)
Acid-base deficit: 4 mmol/L — ABNORMAL HIGH (ref 0.0–2.0)
Bicarbonate: 26.1 mmol/L (ref 20.0–28.0)
Bicarbonate: 21.8 mmol/L (ref 20.0–28.0)
Bicarbonate: 24.1 mmol/L (ref 20.0–28.0)
Bicarbonate: 25.2 mmol/L (ref 20.0–28.0)
Bicarbonate: 22.7 mmol/L (ref 20.0–28.0)
Bicarbonate: 20.8 mmol/L (ref 20.0–28.0)
Bicarbonate: 21.8 mmol/L (ref 20.0–28.0)
Calcium, Ion: 1.26 mmol/L (ref 1.15–1.40)
Calcium, Ion: 0.99 mmol/L — ABNORMAL LOW (ref 1.15–1.40)
Calcium, Ion: 1.11 mmol/L — ABNORMAL LOW (ref 1.15–1.40)
Calcium, Ion: 1.14 mmol/L — ABNORMAL LOW (ref 1.15–1.40)
Calcium, Ion: 1.15 mmol/L (ref 1.15–1.40)
Calcium, Ion: 1.13 mmol/L — ABNORMAL LOW (ref 1.15–1.40)
Calcium, Ion: 1.15 mmol/L (ref 1.15–1.40)
HCT: 38 % — ABNORMAL LOW (ref 39.0–52.0)
HCT: 27 % — ABNORMAL LOW (ref 39.0–52.0)
HCT: 29 % — ABNORMAL LOW (ref 39.0–52.0)
HCT: 29 % — ABNORMAL LOW (ref 39.0–52.0)
HCT: 31 % — ABNORMAL LOW (ref 39.0–52.0)
HCT: 31 % — ABNORMAL LOW (ref 39.0–52.0)
HCT: 29 % — ABNORMAL LOW (ref 39.0–52.0)
Hemoglobin: 12.9 g/dL — ABNORMAL LOW (ref 13.0–17.0)
Hemoglobin: 9.2 g/dL — ABNORMAL LOW (ref 13.0–17.0)
Hemoglobin: 9.9 g/dL — ABNORMAL LOW (ref 13.0–17.0)
Hemoglobin: 9.9 g/dL — ABNORMAL LOW (ref 13.0–17.0)
Hemoglobin: 10.5 g/dL — ABNORMAL LOW (ref 13.0–17.0)
Hemoglobin: 10.5 g/dL — ABNORMAL LOW (ref 13.0–17.0)
Hemoglobin: 9.9 g/dL — ABNORMAL LOW (ref 13.0–17.0)
O2 Saturation: 100 %
O2 Saturation: 100 %
O2 Saturation: 100 %
O2 Saturation: 100 %
O2 Saturation: 99 %
O2 Saturation: 99 %
O2 Saturation: 99 %
Patient temperature: 35.6
Patient temperature: 37.1
Patient temperature: 37.2
Potassium: 4.2 mmol/L (ref 3.5–5.1)
Potassium: 5 mmol/L (ref 3.5–5.1)
Potassium: 4.8 mmol/L (ref 3.5–5.1)
Potassium: 4.4 mmol/L (ref 3.5–5.1)
Potassium: 3.3 mmol/L — ABNORMAL LOW (ref 3.5–5.1)
Potassium: 3.9 mmol/L (ref 3.5–5.1)
Potassium: 4.1 mmol/L (ref 3.5–5.1)
Sodium: 139 mmol/L (ref 135–145)
Sodium: 138 mmol/L (ref 135–145)
Sodium: 137 mmol/L (ref 135–145)
Sodium: 137 mmol/L (ref 135–145)
Sodium: 140 mmol/L (ref 135–145)
Sodium: 140 mmol/L (ref 135–145)
Sodium: 137 mmol/L (ref 135–145)
TCO2: 27 mmol/L (ref 22–32)
TCO2: 23 mmol/L (ref 22–32)
TCO2: 25 mmol/L (ref 22–32)
TCO2: 26 mmol/L (ref 22–32)
TCO2: 24 mmol/L (ref 22–32)
TCO2: 22 mmol/L (ref 22–32)
TCO2: 23 mmol/L (ref 22–32)
pCO2 arterial: 45.7 mmHg (ref 32–48)
pCO2 arterial: 41.9 mmHg (ref 32–48)
pCO2 arterial: 39.6 mmHg (ref 32–48)
pCO2 arterial: 41.6 mmHg (ref 32–48)
pCO2 arterial: 44.5 mmHg (ref 32–48)
pCO2 arterial: 39.8 mmHg (ref 32–48)
pCO2 arterial: 41 mmHg (ref 32–48)
pH, Arterial: 7.365 (ref 7.35–7.45)
pH, Arterial: 7.325 — ABNORMAL LOW (ref 7.35–7.45)
pH, Arterial: 7.393 (ref 7.35–7.45)
pH, Arterial: 7.389 (ref 7.35–7.45)
pH, Arterial: 7.308 — ABNORMAL LOW (ref 7.35–7.45)
pH, Arterial: 7.326 — ABNORMAL LOW (ref 7.35–7.45)
pH, Arterial: 7.336 — ABNORMAL LOW (ref 7.35–7.45)
pO2, Arterial: 307 mmHg — ABNORMAL HIGH (ref 83–108)
pO2, Arterial: 362 mmHg — ABNORMAL HIGH (ref 83–108)
pO2, Arterial: 332 mmHg — ABNORMAL HIGH (ref 83–108)
pO2, Arterial: 327 mmHg — ABNORMAL HIGH (ref 83–108)
pO2, Arterial: 127 mmHg — ABNORMAL HIGH (ref 83–108)
pO2, Arterial: 125 mmHg — ABNORMAL HIGH (ref 83–108)
pO2, Arterial: 143 mmHg — ABNORMAL HIGH (ref 83–108)

## 2023-05-15 LAB — POCT I-STAT, CHEM 8
BUN: 15 mg/dL (ref 8–23)
BUN: 19 mg/dL (ref 8–23)
BUN: 12 mg/dL (ref 8–23)
BUN: 12 mg/dL (ref 8–23)
BUN: 14 mg/dL (ref 8–23)
Calcium, Ion: 1.23 mmol/L (ref 1.15–1.40)
Calcium, Ion: 1.18 mmol/L (ref 1.15–1.40)
Calcium, Ion: 1.14 mmol/L — ABNORMAL LOW (ref 1.15–1.40)
Calcium, Ion: 1.11 mmol/L — ABNORMAL LOW (ref 1.15–1.40)
Calcium, Ion: 1.13 mmol/L — ABNORMAL LOW (ref 1.15–1.40)
Chloride: 103 mmol/L (ref 98–111)
Chloride: 103 mmol/L (ref 98–111)
Chloride: 103 mmol/L (ref 98–111)
Chloride: 101 mmol/L (ref 98–111)
Chloride: 102 mmol/L (ref 98–111)
Creatinine, Ser: 0.6 mg/dL — ABNORMAL LOW (ref 0.61–1.24)
Creatinine, Ser: 0.5 mg/dL — ABNORMAL LOW (ref 0.61–1.24)
Creatinine, Ser: 0.6 mg/dL — ABNORMAL LOW (ref 0.61–1.24)
Creatinine, Ser: 0.5 mg/dL — ABNORMAL LOW (ref 0.61–1.24)
Creatinine, Ser: 0.5 mg/dL — ABNORMAL LOW (ref 0.61–1.24)
Glucose, Bld: 111 mg/dL — ABNORMAL HIGH (ref 70–99)
Glucose, Bld: 113 mg/dL — ABNORMAL HIGH (ref 70–99)
Glucose, Bld: 105 mg/dL — ABNORMAL HIGH (ref 70–99)
Glucose, Bld: 112 mg/dL — ABNORMAL HIGH (ref 70–99)
Glucose, Bld: 144 mg/dL — ABNORMAL HIGH (ref 70–99)
HCT: 38 % — ABNORMAL LOW (ref 39.0–52.0)
HCT: 35 % — ABNORMAL LOW (ref 39.0–52.0)
HCT: 31 % — ABNORMAL LOW (ref 39.0–52.0)
HCT: 29 % — ABNORMAL LOW (ref 39.0–52.0)
HCT: 31 % — ABNORMAL LOW (ref 39.0–52.0)
Hemoglobin: 12.9 g/dL — ABNORMAL LOW (ref 13.0–17.0)
Hemoglobin: 11.9 g/dL — ABNORMAL LOW (ref 13.0–17.0)
Hemoglobin: 10.5 g/dL — ABNORMAL LOW (ref 13.0–17.0)
Hemoglobin: 9.9 g/dL — ABNORMAL LOW (ref 13.0–17.0)
Hemoglobin: 10.5 g/dL — ABNORMAL LOW (ref 13.0–17.0)
Potassium: 4.1 mmol/L (ref 3.5–5.1)
Potassium: 5.7 mmol/L — ABNORMAL HIGH (ref 3.5–5.1)
Potassium: 5.3 mmol/L — ABNORMAL HIGH (ref 3.5–5.1)
Potassium: 5.1 mmol/L (ref 3.5–5.1)
Potassium: 4.4 mmol/L (ref 3.5–5.1)
Sodium: 139 mmol/L (ref 135–145)
Sodium: 135 mmol/L (ref 135–145)
Sodium: 137 mmol/L (ref 135–145)
Sodium: 134 mmol/L — ABNORMAL LOW (ref 135–145)
Sodium: 136 mmol/L (ref 135–145)
TCO2: 26 mmol/L (ref 22–32)
TCO2: 29 mmol/L (ref 22–32)
TCO2: 25 mmol/L (ref 22–32)
TCO2: 26 mmol/L (ref 22–32)
TCO2: 26 mmol/L (ref 22–32)

## 2023-05-15 LAB — BASIC METABOLIC PANEL
Anion gap: 10 (ref 5–15)
BUN: 15 mg/dL (ref 8–23)
CO2: 23 mmol/L (ref 22–32)
Calcium: 9.1 mg/dL (ref 8.9–10.3)
Chloride: 103 mmol/L (ref 98–111)
Creatinine, Ser: 0.74 mg/dL (ref 0.61–1.24)
GFR, Estimated: >60 mL/min (ref >60)
Glucose, Bld: 158 mg/dL — ABNORMAL HIGH (ref 70–99)
Potassium: 3.8 mmol/L (ref 3.5–5.1)
Sodium: 136 mmol/L (ref 135–145)

## 2023-05-15 LAB — POCT I-STAT EG7
Acid-base deficit: 3 mmol/L — ABNORMAL HIGH (ref 0.0–2.0)
Bicarbonate: 23.3 mmol/L (ref 20.0–28.0)
Calcium, Ion: 1.05 mmol/L — ABNORMAL LOW (ref 1.15–1.40)
HCT: 29 % — ABNORMAL LOW (ref 39.0–52.0)
Hemoglobin: 9.9 g/dL — ABNORMAL LOW (ref 13.0–17.0)
O2 Saturation: 78 %
Potassium: 4.7 mmol/L (ref 3.5–5.1)
Sodium: 138 mmol/L (ref 135–145)
TCO2: 25 mmol/L (ref 22–32)
pCO2, Ven: 46.2 mmHg (ref 44–60)
pH, Ven: 7.311 (ref 7.25–7.43)
pO2, Ven: 46 mmHg — ABNORMAL HIGH (ref 32–45)

## 2023-05-15 LAB — PROTIME-INR
INR: 1.2 (ref 0.8–1.2)
Prothrombin Time: 15.6 s — ABNORMAL HIGH (ref 11.4–15.2)

## 2023-05-15 LAB — APTT: aPTT: 32 s (ref 24–36)

## 2023-05-15 LAB — ECHO INTRAOPERATIVE TEE
Height: 63 in
Weight: 2560 [oz_av]

## 2023-05-15 LAB — MAGNESIUM: Magnesium: 2.1 mg/dL (ref 1.7–2.4)

## 2023-05-15 LAB — HEMOGLOBIN AND HEMATOCRIT, BLOOD
HCT: 29.7 % — ABNORMAL LOW (ref 39.0–52.0)
Hemoglobin: 10.1 g/dL — ABNORMAL LOW (ref 13.0–17.0)

## 2023-05-15 LAB — SARS CORONAVIRUS 2 (TAT 6-24 HRS): SARS Coronavirus 2: NEGATIVE

## 2023-05-15 LAB — PLATELET COUNT: Platelets: 134 10*3/uL — ABNORMAL LOW (ref 150–400)

## 2023-05-15 LAB — HEPARIN LEVEL (UNFRACTIONATED): Heparin Unfractionated: 0.36 [IU]/mL (ref 0.30–0.70)

## 2023-05-15 SURGERY — CORONARY ARTERY BYPASS GRAFTING (CABG)
Anesthesia: General | Site: Chest

## 2023-05-15 MED ORDER — PHENYLEPHRINE 80 MCG/ML (10ML) SYRINGE FOR IV PUSH (FOR BLOOD PRESSURE SUPPORT)
PREFILLED_SYRINGE | INTRAVENOUS | Status: DC | PRN
  Administered 2023-05-15 (×3): 80 ug via INTRAVENOUS

## 2023-05-15 MED ORDER — DEXMEDETOMIDINE HCL IN NACL 400 MCG/100ML IV SOLN: 0.0000 ug/kg/h | INTRAVENOUS | Status: DC

## 2023-05-15 MED ORDER — POTASSIUM CHLORIDE 10 MEQ/50ML IV SOLN
10.0000 meq | INTRAVENOUS | Status: AC
  Administered 2023-05-15 (×3): 10 meq via INTRAVENOUS

## 2023-05-15 MED ORDER — ASPIRIN 81 MG PO CHEW
324.0000 mg | CHEWABLE_TABLET | Freq: Once | ORAL | Status: AC
  Administered 2023-05-15: 324 mg via ORAL
  Filled 2023-05-15: qty 4

## 2023-05-15 MED ORDER — ROCURONIUM BROMIDE 10 MG/ML (PF) SYRINGE
PREFILLED_SYRINGE | INTRAVENOUS | Status: DC | PRN
  Administered 2023-05-15: 100 mg via INTRAVENOUS
  Administered 2023-05-15: 50 mg via INTRAVENOUS
  Administered 2023-05-15: 10 mg via INTRAVENOUS
  Administered 2023-05-15: 30 mg via INTRAVENOUS

## 2023-05-15 MED ORDER — CHLORHEXIDINE GLUCONATE 0.12 % MT SOLN
15.0000 mL | OROMUCOSAL | Status: AC
  Administered 2023-05-15: 15 mL via OROMUCOSAL
  Filled 2023-05-15: qty 15

## 2023-05-15 MED ORDER — LIDOCAINE 2% (20 MG/ML) 5 ML SYRINGE
INTRAMUSCULAR | Status: AC
  Filled 2023-05-15: qty 5

## 2023-05-15 MED ORDER — DOCUSATE SODIUM 100 MG PO CAPS
200.0000 mg | ORAL_CAPSULE | Freq: Every day | ORAL | Status: DC
  Administered 2023-05-16 – 2023-05-19 (×4): 200 mg via ORAL
  Filled 2023-05-15 (×5): qty 2

## 2023-05-15 MED ORDER — FENTANYL CITRATE (PF) 250 MCG/5ML IJ SOLN
INTRAMUSCULAR | Status: DC | PRN
  Administered 2023-05-15: 100 ug via INTRAVENOUS
  Administered 2023-05-15: 50 ug via INTRAVENOUS
  Administered 2023-05-15 (×2): 100 ug via INTRAVENOUS
  Administered 2023-05-15: 200 ug via INTRAVENOUS
  Administered 2023-05-15: 100 ug via INTRAVENOUS
  Administered 2023-05-15: 150 ug via INTRAVENOUS
  Administered 2023-05-15: 250 ug via INTRAVENOUS
  Administered 2023-05-15 (×2): 100 ug via INTRAVENOUS

## 2023-05-15 MED ORDER — ASPIRIN 81 MG PO CHEW: 324.0000 mg | CHEWABLE_TABLET | Freq: Every day | ORAL | Status: DC

## 2023-05-15 MED ORDER — CEFAZOLIN SODIUM-DEXTROSE 2-4 GM/100ML-% IV SOLN
2.0000 g | Freq: Three times a day (TID) | INTRAVENOUS | Status: AC
  Administered 2023-05-15 – 2023-05-17 (×6): 2 g via INTRAVENOUS
  Filled 2023-05-15 (×6): qty 100

## 2023-05-15 MED ORDER — LACTATED RINGERS IV SOLN: INTRAVENOUS | Status: AC

## 2023-05-15 MED ORDER — INSULIN REGULAR(HUMAN) IN NACL 100-0.9 UT/100ML-% IV SOLN: INTRAVENOUS | Status: DC

## 2023-05-15 MED ORDER — CHLORHEXIDINE GLUCONATE 0.12 % MT SOLN
OROMUCOSAL | Status: AC
Start: 2023-05-15 — End: 2023-05-15
  Filled 2023-05-15: qty 15

## 2023-05-15 MED ORDER — METOPROLOL TARTRATE 12.5 MG HALF TABLET
12.5000 mg | ORAL_TABLET | Freq: Two times a day (BID) | ORAL | Status: DC
  Administered 2023-05-16 – 2023-05-19 (×6): 12.5 mg via ORAL
  Filled 2023-05-15 (×7): qty 1

## 2023-05-15 MED ORDER — ALBUMIN HUMAN 5 % IV SOLN: INTRAVENOUS | Status: DC | PRN

## 2023-05-15 MED ORDER — FENTANYL CITRATE (PF) 250 MCG/5ML IJ SOLN
INTRAMUSCULAR | Status: AC
  Filled 2023-05-15: qty 5

## 2023-05-15 MED ORDER — MIDAZOLAM HCL 2 MG/2ML IJ SOLN: 2.0000 mg | INTRAMUSCULAR | Status: DC | PRN

## 2023-05-15 MED ORDER — ACETAMINOPHEN 500 MG PO TABS
1000.0000 mg | ORAL_TABLET | Freq: Four times a day (QID) | ORAL | Status: DC
  Administered 2023-05-16 – 2023-05-19 (×12): 1000 mg via ORAL
  Filled 2023-05-15 (×12): qty 2

## 2023-05-15 MED ORDER — SODIUM CHLORIDE 0.9 % IV SOLN
INTRAVENOUS | Status: DC | PRN
Start: 2023-05-15 — End: 2023-05-15

## 2023-05-15 MED ORDER — BISACODYL 5 MG PO TBEC
10.0000 mg | DELAYED_RELEASE_TABLET | Freq: Every day | ORAL | Status: DC
  Administered 2023-05-16 – 2023-05-19 (×4): 10 mg via ORAL
  Filled 2023-05-15 (×4): qty 2

## 2023-05-15 MED ORDER — EPHEDRINE 5 MG/ML INJ
INTRAVENOUS | Status: AC
  Filled 2023-05-15: qty 5

## 2023-05-15 MED ORDER — PHENYLEPHRINE 80 MCG/ML (10ML) SYRINGE FOR IV PUSH (FOR BLOOD PRESSURE SUPPORT)
PREFILLED_SYRINGE | INTRAVENOUS | Status: AC
  Filled 2023-05-15: qty 10

## 2023-05-15 MED ORDER — SODIUM CHLORIDE 0.9 % IV SOLN: 250.0000 mL | INTRAVENOUS | Status: AC

## 2023-05-15 MED ORDER — MIDAZOLAM HCL 2 MG/2ML IJ SOLN
INTRAMUSCULAR | Status: DC
Start: 2023-05-15 — End: 2023-05-15
  Filled 2023-05-15: qty 2

## 2023-05-15 MED ORDER — LACTATED RINGERS IV SOLN: INTRAVENOUS | Status: DC | PRN

## 2023-05-15 MED ORDER — SODIUM CHLORIDE 0.9% FLUSH: 3.0000 mL | INTRAVENOUS | Status: DC | PRN

## 2023-05-15 MED ORDER — MIDAZOLAM HCL (PF) 5 MG/ML IJ SOLN
INTRAMUSCULAR | Status: DC | PRN
  Administered 2023-05-15: 1 mg via INTRAVENOUS
  Administered 2023-05-15: 5 mg via INTRAVENOUS
  Administered 2023-05-15 (×2): 2 mg via INTRAVENOUS

## 2023-05-15 MED ORDER — DEXTROSE 50 % IV SOLN: 0.0000 mL | INTRAVENOUS | Status: DC | PRN

## 2023-05-15 MED ORDER — 0.9 % SODIUM CHLORIDE (POUR BTL) OPTIME
TOPICAL | Status: DC | PRN
Start: 2023-05-15 — End: 2023-05-15
  Administered 2023-05-15: 6000 mL

## 2023-05-15 MED ORDER — SODIUM CHLORIDE 0.9 % IV SOLN: INTRAVENOUS | Status: AC

## 2023-05-15 MED ORDER — PROTAMINE SULFATE 10 MG/ML IV SOLN
INTRAVENOUS | Status: DC | PRN
  Administered 2023-05-15: 20 mg via INTRAVENOUS
  Administered 2023-05-15: 240 mg via INTRAVENOUS

## 2023-05-15 MED ORDER — PANTOPRAZOLE SODIUM 40 MG PO TBEC
40.0000 mg | DELAYED_RELEASE_TABLET | Freq: Every day | ORAL | Status: DC
  Administered 2023-05-17 – 2023-05-19 (×3): 40 mg via ORAL
  Filled 2023-05-15 (×3): qty 1

## 2023-05-15 MED ORDER — MORPHINE SULFATE (PF) 2 MG/ML IV SOLN
1.0000 mg | INTRAVENOUS | Status: DC | PRN
  Administered 2023-05-15: 2 mg via INTRAVENOUS
  Administered 2023-05-15 (×2): 4 mg via INTRAVENOUS
  Administered 2023-05-15 – 2023-05-16 (×3): 2 mg via INTRAVENOUS
  Administered 2023-05-16: 4 mg via INTRAVENOUS
  Filled 2023-05-15 (×3): qty 1
  Filled 2023-05-15 (×2): qty 2
  Filled 2023-05-15: qty 1
  Filled 2023-05-15: qty 2

## 2023-05-15 MED ORDER — VANCOMYCIN HCL IN DEXTROSE 1-5 GM/200ML-% IV SOLN
1000.0000 mg | Freq: Once | INTRAVENOUS | Status: AC
  Administered 2023-05-15: 1000 mg via INTRAVENOUS
  Filled 2023-05-15: qty 200

## 2023-05-15 MED ORDER — PLASMA-LYTE A IV SOLN: INTRAVENOUS | Status: DC | PRN

## 2023-05-15 MED ORDER — METOPROLOL TARTRATE 5 MG/5ML IV SOLN: 2.5000 mg | INTRAVENOUS | Status: DC | PRN

## 2023-05-15 MED ORDER — SUGAMMADEX SODIUM 200 MG/2ML IV SOLN
INTRAVENOUS | Status: AC
  Filled 2023-05-15: qty 2

## 2023-05-15 MED ORDER — METOPROLOL TARTRATE 25 MG/10 ML ORAL SUSPENSION
12.5000 mg | Freq: Two times a day (BID) | ORAL | Status: DC
  Administered 2023-05-15: 12.5 mg
  Filled 2023-05-15 (×3): qty 5
  Filled 2023-05-15: qty 10

## 2023-05-15 MED ORDER — PROPOFOL 10 MG/ML IV BOLUS
INTRAVENOUS | Status: AC
Start: 2023-05-15 — End: ?
  Filled 2023-05-15: qty 20

## 2023-05-15 MED ORDER — SODIUM CHLORIDE (PF) 0.9 % IJ SOLN: OROMUCOSAL | Status: DC | PRN

## 2023-05-15 MED ORDER — SODIUM CHLORIDE 0.9% FLUSH
3.0000 mL | Freq: Two times a day (BID) | INTRAVENOUS | Status: DC
Start: 2023-05-16 — End: 2023-05-19
  Administered 2023-05-16 – 2023-05-19 (×7): 3 mL via INTRAVENOUS

## 2023-05-15 MED ORDER — PHENYLEPHRINE HCL-NACL 20-0.9 MG/250ML-% IV SOLN: 0.0000 ug/min | INTRAVENOUS | Status: DC

## 2023-05-15 MED ORDER — ASPIRIN 325 MG PO TBEC
325.0000 mg | DELAYED_RELEASE_TABLET | Freq: Every day | ORAL | Status: DC
  Administered 2023-05-16 – 2023-05-19 (×4): 325 mg via ORAL
  Filled 2023-05-15 (×4): qty 1

## 2023-05-15 MED ORDER — PANTOPRAZOLE SODIUM 40 MG IV SOLR
40.0000 mg | Freq: Every day | INTRAVENOUS | Status: AC
  Administered 2023-05-15 – 2023-05-16 (×2): 40 mg via INTRAVENOUS
  Filled 2023-05-15 (×2): qty 10

## 2023-05-15 MED ORDER — EPHEDRINE SULFATE-NACL 50-0.9 MG/10ML-% IV SOSY
PREFILLED_SYRINGE | INTRAVENOUS | Status: DC | PRN
  Administered 2023-05-15: 5 mg via INTRAVENOUS

## 2023-05-15 MED ORDER — PHENYLEPHRINE HCL-NACL 20-0.9 MG/250ML-% IV SOLN
INTRAVENOUS | Status: DC | PRN
  Administered 2023-05-15: 50 ug/min via INTRAVENOUS

## 2023-05-15 MED ORDER — METOCLOPRAMIDE HCL 5 MG/ML IJ SOLN
10.0000 mg | Freq: Four times a day (QID) | INTRAMUSCULAR | Status: AC
  Administered 2023-05-15 – 2023-05-17 (×6): 10 mg via INTRAVENOUS
  Filled 2023-05-15 (×6): qty 2

## 2023-05-15 MED ORDER — ACETAMINOPHEN 160 MG/5ML PO SOLN
650.0000 mg | Freq: Once | ORAL | Status: AC
  Administered 2023-05-15: 650 mg
  Filled 2023-05-15: qty 20.3

## 2023-05-15 MED ORDER — ALBUMIN HUMAN 5 % IV SOLN
250.0000 mL | INTRAVENOUS | Status: DC | PRN
  Administered 2023-05-15 (×3): 12.5 g via INTRAVENOUS
  Filled 2023-05-15: qty 250

## 2023-05-15 MED ORDER — HEMOSTATIC AGENTS (NO CHARGE) OPTIME
TOPICAL | Status: DC | PRN
  Administered 2023-05-15: 1

## 2023-05-15 MED ORDER — ONDANSETRON HCL 4 MG/2ML IJ SOLN
INTRAMUSCULAR | Status: AC
  Filled 2023-05-15: qty 2

## 2023-05-15 MED ORDER — SODIUM CHLORIDE 0.45 % IV SOLN: INTRAVENOUS | Status: AC | PRN

## 2023-05-15 MED ORDER — HEPARIN SODIUM (PORCINE) 1000 UNIT/ML IJ SOLN
INTRAMUSCULAR | Status: DC | PRN
  Administered 2023-05-15: 2000 [IU] via INTRAVENOUS
  Administered 2023-05-15: 24000 [IU] via INTRAVENOUS

## 2023-05-15 MED ORDER — BISACODYL 10 MG RE SUPP: 10.0000 mg | Freq: Every day | RECTAL | Status: DC

## 2023-05-15 MED ORDER — ONDANSETRON HCL 4 MG/2ML IJ SOLN
INTRAMUSCULAR | Status: DC | PRN
  Administered 2023-05-15: 4 mg via INTRAVENOUS

## 2023-05-15 MED ORDER — OXYCODONE HCL 5 MG PO TABS
5.0000 mg | ORAL_TABLET | ORAL | Status: DC | PRN
  Administered 2023-05-15 – 2023-05-18 (×5): 10 mg via ORAL
  Administered 2023-05-19: 5 mg via ORAL
  Filled 2023-05-15: qty 1
  Filled 2023-05-15 (×7): qty 2

## 2023-05-15 MED ORDER — ONDANSETRON HCL 4 MG/2ML IJ SOLN
4.0000 mg | Freq: Four times a day (QID) | INTRAMUSCULAR | Status: DC | PRN
  Administered 2023-05-15 – 2023-05-17 (×2): 4 mg via INTRAVENOUS
  Filled 2023-05-15 (×2): qty 2

## 2023-05-15 MED ORDER — MIDAZOLAM HCL (PF) 10 MG/2ML IJ SOLN
INTRAMUSCULAR | Status: AC
  Filled 2023-05-15: qty 2

## 2023-05-15 MED ORDER — ACETAMINOPHEN 160 MG/5ML PO SOLN: 1000.0000 mg | Freq: Four times a day (QID) | ORAL | Status: DC

## 2023-05-15 MED ORDER — ROCURONIUM BROMIDE 10 MG/ML (PF) SYRINGE
PREFILLED_SYRINGE | INTRAVENOUS | Status: AC
  Filled 2023-05-15: qty 10

## 2023-05-15 MED ORDER — NITROGLYCERIN IN D5W 200-5 MCG/ML-% IV SOLN: 0.0000 ug/min | INTRAVENOUS | Status: DC

## 2023-05-15 MED ORDER — TRAMADOL HCL 50 MG PO TABS
50.0000 mg | ORAL_TABLET | ORAL | Status: DC | PRN
  Administered 2023-05-16 – 2023-05-18 (×8): 100 mg via ORAL
  Filled 2023-05-15 (×8): qty 2

## 2023-05-15 MED ORDER — GABAPENTIN 300 MG PO CAPS
300.0000 mg | ORAL_CAPSULE | Freq: Three times a day (TID) | ORAL | Status: DC
Start: 2023-05-16 — End: 2023-05-19
  Administered 2023-05-16 – 2023-05-19 (×10): 300 mg via ORAL
  Filled 2023-05-15 (×10): qty 1

## 2023-05-15 MED ORDER — MAGNESIUM SULFATE 4 GM/100ML IV SOLN
4.0000 g | Freq: Once | INTRAVENOUS | Status: AC
  Administered 2023-05-15: 4 g via INTRAVENOUS
  Filled 2023-05-15: qty 100

## 2023-05-15 SURGICAL SUPPLY — 81 items
ANTIFOG SOL W/FOAM PAD STRL (MISCELLANEOUS) ×2 IMPLANT
BAG DECANTER FOR FLEXI CONT (MISCELLANEOUS) ×2 IMPLANT
BLADE CLIPPER SURG (BLADE) ×2 IMPLANT
BLADE STERNUM SYSTEM 6 (BLADE) ×2 IMPLANT
BLADE SURG 11 STRL SS (BLADE) IMPLANT
BNDG ELASTIC 4INX 5YD STR LF (GAUZE/BANDAGES/DRESSINGS) IMPLANT
BNDG ELASTIC 6INX 5YD STR LF (GAUZE/BANDAGES/DRESSINGS) ×2 IMPLANT
BNDG GAUZE DERMACEA FLUFF 4 (GAUZE/BANDAGES/DRESSINGS) ×2 IMPLANT
CANISTER SUCT 3000ML PPV (MISCELLANEOUS) ×2 IMPLANT
CANNULA EZ GLIDE AORTIC 21FR (CANNULA) ×2 IMPLANT
CANNULA MC2 2 STG 36/46 CONN (CANNULA) IMPLANT
CATH ROBINSON RED A/P 18FR (CATHETERS) ×2 IMPLANT
CATH THORACIC 36FR (CATHETERS) ×2 IMPLANT
CATH THORACIC 36FR RT ANG (CATHETERS) ×2 IMPLANT
CLIP FOGARTY SPRING 6M (CLIP) IMPLANT
CLIP TI MEDIUM 24 (CLIP) IMPLANT
CLIP TI WIDE RED SMALL 24 (CLIP) IMPLANT
CONTAINER PROTECT SURGISLUSH (MISCELLANEOUS) ×4 IMPLANT
DERMABOND ADVANCED .7 DNX12 (GAUZE/BANDAGES/DRESSINGS) IMPLANT
DRAPE SRG 135X102X78XABS (DRAPES) ×2 IMPLANT
DRAPE WARM FLUID 44X44 (DRAPES) ×2 IMPLANT
DRSG COVADERM 4X14 (GAUZE/BANDAGES/DRESSINGS) ×2 IMPLANT
ELECT REM PT RETURN 9FT ADLT (ELECTROSURGICAL) ×4 IMPLANT
ELECTRODE REM PT RTRN 9FT ADLT (ELECTROSURGICAL) ×4 IMPLANT
FELT TEFLON 1X6 (MISCELLANEOUS) ×4 IMPLANT
GAUZE 4X4 16PLY ~~LOC~~+RFID DBL (SPONGE) ×2 IMPLANT
GAUZE SPONGE 4X4 12PLY STRL (GAUZE/BANDAGES/DRESSINGS) ×4 IMPLANT
GLOVE SS BIOGEL STRL SZ 7.5 (GLOVE) ×2 IMPLANT
GLOVE SURG SIGNA 7.5 PF LTX (GLOVE) ×6 IMPLANT
GOWN STRL REUS W/ TWL LRG LVL3 (GOWN DISPOSABLE) ×8 IMPLANT
GOWN STRL REUS W/ TWL XL LVL3 (GOWN DISPOSABLE) ×4 IMPLANT
HEMOSTAT POWDER SURGIFOAM 1G (HEMOSTASIS) ×6 IMPLANT
HEMOSTAT SURGICEL 2X14 (HEMOSTASIS) ×2 IMPLANT
INSERT FOGARTY XLG (MISCELLANEOUS) IMPLANT
KIT BASIN OR (CUSTOM PROCEDURE TRAY) ×2 IMPLANT
KIT SUCTION CATH 14FR (SUCTIONS) ×4 IMPLANT
KIT TURNOVER KIT B (KITS) ×2 IMPLANT
KIT VASOVIEW HEMOPRO 2 VH 4000 (KITS) ×2 IMPLANT
MARKER GRAFT CORONARY BYPASS (MISCELLANEOUS) ×6 IMPLANT
NS IRRIG 1000ML POUR BTL (IV SOLUTION) ×10 IMPLANT
PACK E OPEN HEART (SUTURE) ×2 IMPLANT
PACK OPEN HEART (CUSTOM PROCEDURE TRAY) ×2 IMPLANT
PAD ARMBOARD 7.5X6 YLW CONV (MISCELLANEOUS) ×4 IMPLANT
PAD ELECT DEFIB RADIOL ZOLL (MISCELLANEOUS) ×2 IMPLANT
PENCIL BUTTON HOLSTER BLD 10FT (ELECTRODE) ×2 IMPLANT
POSITIONER HEAD DONUT 9IN (MISCELLANEOUS) ×2 IMPLANT
PUNCH AORTIC ROTATE 4.5MM 8IN (MISCELLANEOUS) IMPLANT
PUNCH AORTIC ROTATE 5MM 8IN (MISCELLANEOUS) IMPLANT
SET MPS 3-ND DEL (MISCELLANEOUS) IMPLANT
SOLUTION ANTFG W/FOAM PAD STRL (MISCELLANEOUS) IMPLANT
SPONGE T-LAP 18X18 ~~LOC~~+RFID (SPONGE) ×8 IMPLANT
SPONGE T-LAP 4X18 ~~LOC~~+RFID (SPONGE) ×2 IMPLANT
STOPCOCK 4 WAY LG BORE MALE ST (IV SETS) IMPLANT
SUPPORT HEART JANKE-BARRON (MISCELLANEOUS) ×2 IMPLANT
SUT BONE WAX W31G (SUTURE) ×2 IMPLANT
SUT MNCRL AB 4-0 PS2 18 (SUTURE) IMPLANT
SUT PROLENE 3 0 SH DA (SUTURE) ×2 IMPLANT
SUT PROLENE 4 0 SH DA (SUTURE) IMPLANT
SUT PROLENE 4-0 RB1 .5 CRCL 36 (SUTURE) IMPLANT
SUT PROLENE 6 0 C 1 30 (SUTURE) ×4 IMPLANT
SUT PROLENE 7 0 BV 1 (SUTURE) IMPLANT
SUT PROLENE 7 0 BV1 MDA (SUTURE) ×2 IMPLANT
SUT PROLENE 8 0 BV175 6 (SUTURE) IMPLANT
SUT STEEL 6MS V (SUTURE) ×2 IMPLANT
SUT STEEL STERNAL CCS#1 18IN (SUTURE) IMPLANT
SUT STEEL SZ 6 DBL 3X14 BALL (SUTURE) ×2 IMPLANT
SUT VIC AB 1 CTX36XBRD ANBCTR (SUTURE) ×4 IMPLANT
SUT VIC AB 2-0 CT1 TAPERPNT 27 (SUTURE) IMPLANT
SUT VIC AB 2-0 CTX 27 (SUTURE) IMPLANT
SUT VIC AB 3-0 SH 27X BRD (SUTURE) IMPLANT
SUT VIC AB 3-0 X1 27 (SUTURE) IMPLANT
SUT VICRYL 4-0 PS2 18IN ABS (SUTURE) IMPLANT
SYSTEM SAHARA CHEST DRAIN ATS (WOUND CARE) ×2 IMPLANT
TAPE CLOTH SURG 4X10 WHT LF (GAUZE/BANDAGES/DRESSINGS) IMPLANT
TAPE PAPER 2X10 WHT MICROPORE (GAUZE/BANDAGES/DRESSINGS) IMPLANT
TOWEL GREEN STERILE (TOWEL DISPOSABLE) ×2 IMPLANT
TOWEL GREEN STERILE FF (TOWEL DISPOSABLE) ×2 IMPLANT
TRAY FOLEY SLVR 16FR TEMP STAT (SET/KITS/TRAYS/PACK) ×2 IMPLANT
TUBING LAP HI FLOW INSUFFLATIO (TUBING) ×2 IMPLANT
UNDERPAD 30X36 HEAVY ABSORB (UNDERPADS AND DIAPERS) ×2 IMPLANT
WATER STERILE IRR 1000ML POUR (IV SOLUTION) ×4 IMPLANT

## 2023-05-15 NOTE — Brief Op Note (Signed)
 05/10/2023 - 05/15/2023  7:27 AM  PATIENT:  Voncille Lo  63 y.o. male  PRE-OPERATIVE DIAGNOSIS:  Coronary Artery Disease  POST-OPERATIVE DIAGNOSIS:  Coronary Artery Disease  PROCEDURE:  CORONARY ARTERY BYPASS GRAFTING (CABG) TIMES FOUR USING LEFT INTERNAL MAMMARY ARTERY AND ENDOSCOPICALLY HARVESTED RIGHT GREATER SAPHENOUS VEIN  TRANSESOPHAGEAL ECHOCARDIOGRAM (TEE)  Vein harvest time: Vein prep time: -SVG to Diagonal 1 -Sequential SVG to Ramus and OM2 -LIMA to LAD  SURGEON:  Surgeons and Role:    * [name], MD - Primary Jayme Cloud, MD  PHYSICIAN ASSISTANT: Aloha Gell PA-C  ASSISTANTS: none   ANESTHESIA:   general  EBL:  915 mL   BLOOD ADMINISTERED:none  DRAINS:  Mediastinal drains    LOCAL MEDICATIONS USED:  NONE  SPECIMEN:  No Specimen  DISPOSITION OF SPECIMEN:  N/A  COUNTS:  YES  DICTATION: .Dragon Dictation  PLAN OF CARE: Admit to inpatient   PATIENT DISPOSITION:  ICU - intubated and hemodynamically stable.   Delay start of Pharmacological VTE agent (>24hrs) due to surgical blood loss or risk of bleeding: yes

## 2023-05-15 NOTE — Discharge Instructions (Signed)
 Instrucciones de alta:  1. Puede ducharse, lavar las incisiones diariamente con agua y [place] y mantenerlas secas.  Si desea [employer] con un vendaje, puede [place], pero mantngalas limpias y [place] diariamente.  No baarse en tina ni nadar hasta que las incisiones hayan sanado por completo.  Si sus incisiones se enrojecen o desarrollan algn drenaje, llame a nuestra oficina al [phone].  2. No conducir hasta que el consultorio del Dr. Leafy Ro lo autorice y ya no est usando analgsicos narcticos.  3. Controle su peso diariamente. Utilice la misma bscula y pese al [employer]... Si aumenta de 5 a 10 libras en 48 horas con hinchazn asociada de las extremidades inferiores, comunquese con nuestra oficina al [phone]  4. Fiebre de 101.5 durante al menos 24 horas sin origen, comunquese con [place] oficina al [phone]  5. Actividad: segn lo tolere, camine al menos 3 veces al [employer].  Evite actividades extenuantes, no levante, empuje ni hale con los brazos ms de 8 a 10 libras durante un mnimo de 6 semanas.  6. Si surge alguna pregunta o inquietud, no dude en comunicarse con nuestra oficina al [phone].

## 2023-05-15 NOTE — Procedures (Signed)
 Extubation Procedure Note  Patient Details:   Name: [name] DOB: [birth year] MRN: [id number]   Airway Documentation:    Vent end date: 05/15/23 Vent end time: 2113   Evaluation  O2 sats: stable throughout Complications: No apparent complications Patient did tolerate procedure well. Bilateral Breath Sounds: Clear, Diminished   Yes  Pt extubated to 4L [place] per protocol. NIF -34 VC: IS: . Audible cuff leak, no stridor noted, pt able to say his name.   Berton Bon 05/15/2023, 9:14 PM

## 2023-05-15 NOTE — Progress Notes (Signed)
      [address]       [place] [address]             [phone]        Contacted via nursing patient stated he did not know about the extensive nature of surgery with his chest being opened.  I went and spoke with patient via Tele interpreter services with [place].  I explained the procedure in detail.  All questions were addressed and answered.  He was appreciative for additional information.  He was agreeable to proceed.  Lowella Dandy, PA-C 9:37 AM 05/15/23

## 2023-05-15 NOTE — Transfer of Care (Signed)
 Immediate Anesthesia Transfer of Care Note  Patient: [name]  Procedure(s) Performed: CORONARY ARTERY BYPASS GRAFTING (CABG) TIMES FOUR USING LEFT INTERNAL MAMMARY ARTERY AND ENDOSCOPICALLY HARVESTED RIGHT GREATER SAPHENOUS VEIN (Chest) TRANSESOPHAGEAL ECHOCARDIOGRAM (TEE)  Patient Location: SICU  Anesthesia Type:General  Level of Consciousness: sedated and Patient remains intubated per anesthesia plan  Airway & Oxygen Therapy: Patient remains intubated per anesthesia plan and Patient placed on Ventilator (see vital sign flow sheet for setting)  Post-op Assessment: Report given to RN and Post -op Vital signs reviewed and stable  Post vital signs: Reviewed and stable  Last Vitals:  Vitals Value Taken Time  BP 96/67 05/15/23 1726  Temp 36 C 05/15/23 1729  Pulse 75 05/15/23 1729  Resp 17 05/15/23 1729  SpO2 100 % 05/15/23 1729  Vitals shown include unfiled device data.  Last Pain:  Vitals:   05/15/23 0900  TempSrc: Oral  PainSc:          Complications: No notable events documented.

## 2023-05-15 NOTE — Anesthesia Preprocedure Evaluation (Addendum)
 Anesthesia Evaluation  Patient identified by MRN, date of birth, ID band Patient awake    Reviewed: Allergy & Precautions, NPO status , Patient's Chart, lab work & pertinent test results  Airway Mallampati: II  TM Distance: >3 FB Neck ROM: Full    Dental  (+) Loose,    Pulmonary Current Smoker and Patient abstained from smoking.   Pulmonary exam normal        Cardiovascular hypertension, Pt. on medications + CAD, + Past MI and + Peripheral Vascular Disease  Normal cardiovascular exam  ECHO: 1. Left ventricular ejection fraction, by estimation, is 45 to 50%. The  left ventricle has mildly decreased function. The left ventricle  demonstrates regional wall motion abnormalities (see scoring  diagram/findings for description). There is mild  concentric left ventricular hypertrophy. Left ventricular diastolic  parameters are consistent with Grade I diastolic dysfunction (impaired  relaxation).   2. Prominent trabeculation at LV apex. Definity contrast demonstrates  slow flow and loosely organized mural thrombus at anteroapex as well.   3. Right ventricular systolic function is normal. The right ventricular  size is normal. Tricuspid regurgitation signal is inadequate for assessing  PA pressure.   4. The mitral valve is grossly normal. Trivial mitral valve  regurgitation.   5. The aortic valve is tricuspid. Aortic valve regurgitation is not  visualized. No aortic stenosis is present.   6. The inferior vena cava is normal in size with <50% respiratory  variability, suggesting right atrial pressure of 8 mmHg.     Neuro/Psych  negative psych ROS   GI/Hepatic negative GI ROS, Neg liver ROS,,,  Endo/Other  diabetes, Oral Hypoglycemic Agents    Renal/GU negative Renal ROS     Musculoskeletal  (+) Arthritis ,    Abdominal   Peds  Hematology negative hematology ROS (+)   Anesthesia Other Findings CAD   Reproductive/Obstetrics                             Anesthesia Physical Anesthesia Plan  ASA: 4  Anesthesia Plan: General   Post-op Pain Management:    Induction: Intravenous  PONV Risk Score and Plan: 1 and Ondansetron, Dexamethasone, Midazolam and Treatment may vary due to age or medical condition  Airway Management Planned: Oral ETT  Additional Equipment: Arterial line, CVP, PA Cath, TEE and Ultrasound Guidance Line Placement  Intra-op Plan:   Post-operative Plan: Post-operative intubation/ventilation  Informed Consent: I have reviewed the patients History and Physical, chart, labs and discussed the procedure including the risks, benefits and alternatives for the proposed anesthesia with the patient or authorized representative who has indicated his/her understanding and acceptance.     Dental advisory given and Interpreter used for interview  Plan Discussed with: CRNA  Anesthesia Plan Comments:         Anesthesia Quick Evaluation

## 2023-05-15 NOTE — Anesthesia Procedure Notes (Signed)
 Arterial Line Insertion Start/End2/24/2025 9:45 AM, 05/15/2023 10:00 AM Performed by: Leonides Grills, MD, anesthesiologist  Patient location: Pre-op. Preanesthetic checklist: patient identified, IV checked, site marked, risks and benefits discussed, surgical consent, monitors and equipment checked, pre-op evaluation, timeout performed and anesthesia consent Lidocaine 1% used for infiltration Left, radial was placed Catheter size: 20 G Hand hygiene performed , maximum sterile barriers used  and Seldinger technique used  Attempts: 1 Procedure performed using ultrasound guided technique. Ultrasound Notes:anatomy identified, needle tip was noted to be adjacent to the nerve/plexus identified and no ultrasound evidence of intravascular and/or intraneural injection Following insertion, dressing applied and Biopatch. Post procedure assessment: normal and unchanged  Post procedure complications: second provider assisted (Previous unsuccessful attempts by CRNA). Patient tolerated the procedure well with no immediate complications.

## 2023-05-15 NOTE — Progress Notes (Signed)
Rapid wean initiated at this time per protocol  

## 2023-05-15 NOTE — Anesthesia Postprocedure Evaluation (Signed)
 Anesthesia Post Note  Patient: [name]  Procedure(s) Performed: CORONARY ARTERY BYPASS GRAFTING (CABG) TIMES FOUR USING LEFT INTERNAL MAMMARY ARTERY AND ENDOSCOPICALLY HARVESTED RIGHT GREATER SAPHENOUS VEIN (Chest) TRANSESOPHAGEAL ECHOCARDIOGRAM (TEE)     Patient location during evaluation: SICU Anesthesia Type: General Level of consciousness: sedated Pain management: pain level controlled Vital Signs Assessment: post-procedure vital signs reviewed and stable Respiratory status: patient remains intubated per anesthesia plan Cardiovascular status: stable Postop Assessment: no apparent nausea or vomiting Anesthetic complications: no   No notable events documented.  Last Vitals:  Vitals:   05/15/23 1927 05/15/23 1946  BP:  (!) 113/56  Pulse: 87 86  Resp:    Temp:    SpO2: 100%     Last Pain:  Vitals:   05/15/23 0900  TempSrc: Oral  PainSc:                  [name] P [name]

## 2023-05-15 NOTE — Anesthesia Procedure Notes (Signed)
 Central Venous Catheter Insertion Performed by: Leonides Grills, MD, anesthesiologist Start/End2/24/2025 10:00 AM, 05/15/2023 10:10 AM Patient location: Pre-op. Preanesthetic checklist: patient identified, IV checked, site marked, risks and benefits discussed, surgical consent, monitors and equipment checked, pre-op evaluation, timeout performed and anesthesia consent Position: Trendelenburg Lidocaine 1% used for infiltration and patient sedated Hand hygiene performed , maximum sterile barriers used  and Seldinger technique used Catheter size: 9 Fr Total catheter length 12. MAC introducer Procedure performed using ultrasound guided technique. Ultrasound Notes:anatomy identified, needle tip was noted to be adjacent to the nerve/plexus identified, no ultrasound evidence of intravascular and/or intraneural injection and image(s) printed for medical record Attempts: 1 Following insertion, line sutured, dressing applied and Biopatch. Post procedure assessment: blood return through all ports, free fluid flow and no air  Patient tolerated the procedure well with no immediate complications.

## 2023-05-15 NOTE — Progress Notes (Signed)
 ANTICOAGULATION CONSULT NOTE  Pharmacy Consult for heparin Indication: chest pain/ACS, s/p cath, getting CVTS consult  No Known Allergies  Patient Measurements: Height: 5\' 3"  (160 cm) Weight: 72.6 kg (160 lb) IBW/kg (Calculated) : 56.9 Heparin Dosing Weight: 72 kg   Vital Signs: Temp: 98.3 F (36.8 C) (02/24 0313) Temp Source: Oral (02/24 0313) BP: 112/70 (02/24 0313) Pulse Rate: 66 (02/24 0313)  Labs: Recent Labs    05/13/23 0322 05/14/23 0357 05/15/23 0008 05/15/23 0418  HGB 13.3 13.8 13.6 13.9  HCT 38.9* 40.5 39.7 40.5  PLT 192 205 187 204  HEPARINUNFRC 0.45 0.36  --  0.36  CREATININE 0.67 0.67 0.74  --     Estimated Creatinine Clearance: 83.4 mL/min (by C-G formula based on SCr of 0.74 mg/dL).  Medical History: Past Medical History:  Diagnosis Date   Arthritis of left knee    Cervical stenosis of spine    Diabetes mellitus without complication (HCC)    Hyperlipidemia LDL goal <70    Primary hypertension 02/07/2022   Tobacco use     Medications:  See MAR  Assessment: 63 yo male presented [facility] ED [facility]/19 with episodic abdominal and radiating chest pain with elevated troponins and EKG changes transferred to [facility] for LHC.  Not on anticoagulation prior to admission.  Pharmacy consulted for heparin dosing.  Cath revealing multi-vessel CAD, TCTS consulted for CABG consideration. Of note, ECHO finding mural thrombus at anteroapex.    Heparin level therapeutic, CBC ok, CABG later.  Goal of Therapy:  Heparin level 0.3-0.7 units/ml Monitor platelets by anticoagulation protocol: Yes   Plan:  Continue heparin infusion at 1200 units/hr Monitor daily HL, CBC, and for s/sx of bleeding   Fredonia Highland, PharmD, BCPS, [facility] Clinical Pharmacist Please check AMION for all [facility] Pharmacy numbers 05/15/2023

## 2023-05-15 NOTE — Interval H&P Note (Signed)
 History and Physical Interval Note:  05/15/2023 8:14 AM  [name]  has presented today for surgery, with the diagnosis of CAD.  The various methods of treatment have been discussed with the patient and family. After consideration of risks, benefits and other options for treatment, the patient has consented to  Procedure(s): CORONARY ARTERY BYPASS GRAFTING (CABG) (N/A) TRANSESOPHAGEAL ECHOCARDIOGRAM (TEE) (N/A) as a surgical intervention.  The patient's history has been reviewed, patient examined, no change in status, stable for surgery.  I have reviewed the patient's chart and labs.  Questions were answered to the patient's satisfaction.     [name]

## 2023-05-15 NOTE — Interval H&P Note (Signed)
 History and Physical Interval Note:  05/15/2023 7:30 AM  [name]  has presented today for surgery, with the diagnosis of CAD.  The various methods of treatment have been discussed with the patient and family. After consideration of risks, benefits and other options for treatment, the patient has consented to  Procedure(s): CORONARY ARTERY BYPASS GRAFTING (CABG) (N/A) TRANSESOPHAGEAL ECHOCARDIOGRAM (TEE) (N/A) as a surgical intervention.  The patient's history has been reviewed, patient examined, no change in status, stable for surgery.  I have reviewed the patient's chart and labs.  Questions were answered to the patient's satisfaction.     [name]

## 2023-05-15 NOTE — Anesthesia Procedure Notes (Signed)
 Arterial Line Insertion Start/End2/24/2025 5:30 PM, 05/15/2023 5:40 PM Performed by: Leonides Grills, MD, anesthesiologist  Patient location: ICU. Preanesthetic checklist: patient identified, IV checked, site marked, risks and benefits discussed, surgical consent, monitors and equipment checked, pre-op evaluation, timeout performed and anesthesia consent Left, radial was placed Catheter size: 20 G Hand hygiene performed , maximum sterile barriers used  and Seldinger technique used  Attempts: 1 Procedure performed without using ultrasound guided (ultrasound-assisted) technique. Following insertion, dressing applied and Biopatch. Post procedure assessment: normal and unchanged  Patient tolerated the procedure well with no immediate complications. Additional procedure comments: Unable to draw back or flush the left radial arterial line upon arrival to the ICU. No waveform visualized on the monitor. Arterial line removed and replaced. Marland Kitchen

## 2023-05-15 NOTE — Op Note (Signed)
 [name], [name] MEDICAL RECORD NO: [id number] ACCOUNT NO: [id number] DATE OF BIRTH: [birth year] FACILITY: [facility] LOCATION: [facility] PHYSICIAN: Salvatore Decent. Dorris Fetch, MD  Operative Report   DATE OF PROCEDURE: 05/15/2023  PREOPERATIVE DIAGNOSIS:  Severe two-vessel coronary artery disease status post non-ST elevation MI.  POSTOPERATIVE DIAGNOSIS:  Severe two-vessel coronary artery disease status post non-ST elevation MI.  PROCEDURE:   Median sternotomy, extracorporeal circulation, Coronary artery bypass grafting x 4 Left internal mammary artery to LAD, Saphenous vein graft to first diagonal,  Sequential saphenous vein graft to ramus intermedius and obtuse marginal 2,  Endoscopic vein harvest right leg.  SURGEON:  Salvatore Decent. Dorris Fetch, MD  RESIDENT SURGEON:  Jayme Cloud, MD, [facility] Resident.  PHYSICIAN ASSISTANT:  Aloha Gell, PA  Experienced assistance was necessary for this case due to surgical complexity.  Aloha Gell independently harvested the saphenous vein from the right leg and closed the leg incisions.  Dr. Jayme Cloud performed portions of the procedure under my direct supervision.  I was present for the entire procedure and performed the critical portions of the procedure personally.  ANESTHESIA:  General.  FINDINGS:  Transesophageal echocardiography showed preserved left ventricular function with no significant valvular pathology.  LAD and diagonal good quality.  Ramus intermedius fair quality.  OM2 1 mm poor quality.  OM1 too small to graft.  Mammary and saphenous vein good quality.  CLINICAL NOTE:  [name] is a 63 year old Hispanic male with multiple cardiac risk factors.  He presented with chest and abdominal pain over a 2-day period.  He ruled in for a non-ST elevation MI.  At catheterization, he was found to have severe two-vessel coronary artery disease with complex LAD disease, not amenable to percutaneous intervention.  He was offered coronary  bypass grafting.  The indications, risks, benefits, and alternatives were discussed in detail with the patient on two occasions, once in Spanish using a professional interpreter.  He understood the nature of the procedure, the incisions to be used, the need for general anesthesia, the use of cardiopulmonary bypass, the use of drainage tubes and temporary pacemaker  wires, postoperatively, the expected hospital stay, and the overall recovery.  He consented to proceed.  OPERATIVE NOTE:  [name] was brought to the operating room on 05/15/2023.  He had induction of general anesthesia.  Dr. Karna Christmas of anesthesia performed transesophageal echocardiography.  Please see his separately dictated note for full details of that procedure.  It showed preserved left ventricular systolic function with no significant valvular pathology.  A Foley catheter was placed.  Intravenous antibiotics were administered.  The chest, abdomen, and legs were prepped and draped in the usual sterile fashion.  A time-out was performed.  An incision was made in the medial aspect of the right leg just below the knee.  The greater saphenous vein was identified and then was harvested from the right thigh endoscopically.  The vein bifurcated and both limbs of the bifurcation were harvested.  The vein was of good quality.  Simultaneously, a median sternotomy was performed and the left internal mammary artery was harvested under direct vision.  Branches were doubly clipped and divided with scissors.  The phrenic nerve was identified.  No cautery was used in its vicinity.  2000 units of heparin were administered during the vessel harvest.  The remainder of the full heparin dose was given prior to opening the pericardium.  There was excellent flow through the left mammary when the distal end was divided.  The remainder of the  full heparin dose was given.  The sternal retractor was placed and was gradually opened over time.  The pericardium was  opened.  The ascending aorta was inspected.  It was of normal caliber with no evidence of atherosclerotic disease.  The aorta was cannulated via concentric 2-0 Ethibond pledgetted pursestring sutures.  A dual-stage venous cannula was placed via a pursestring suture in the right atrial appendage.  Cardiopulmonary bypass was initiated.  Flows were maintained per protocol.  The patient was cooled to 32 degrees Celsius.  The coronary arteries were inspected and anastomotic sites were chosen.  OM1 was too small to graft.  OM2 was a very small vessel, but did appear graftable.  LAD and diagonal appeared good quality.  The ramus also appeared to be a graftable vessel.  The conduits were inspected and prepared.  A foam pad was placed in the pericardium to insulate the heart.  A temperature probe was placed in the myocardial septum and the cardioplegia cannula  was placed in the ascending aorta.  The aorta was cross-clamped.  The left ventricle was emptied via the aortic root vent. Cardiac arrest then was achieved with a combination of cold antegrade blood cardioplegia and topical iced saline.  There was a rapid diastolic arrest.  There was septal cooling to 9 degrees Celsius with 1250 mL of cardioplegia.    A reversed saphenous vein graft was placed end-to-side to the first diagonal branch of the LAD.  This was a 1.5 mm good quality target.  The vein was of good quality.  It was anastomosed end-to-side with a running 7-0 Prolene suture.  At the completion of the anastomosis, the probe passed easily proximally and distally.  Cardioplegia was administered and there was good flow and good hemostasis.  Next, a reversed saphenous vein graft was placed sequentially to the ramus intermedius and obtuse marginal 2.  The ramus intermedius was a 1.5 mm fair quality target.  A side-to-side anastomosis was performed off a side branch of the vein graft with a running 7-0 Prolene suture.  A probe passed easily proximally and  distally and the graft flushed easily through that anastomosis.  The distal end of the anastomosis then was beveled.  It was anastomosed end-to-side to OM2.  OM1 was far too small to graft.  OM2 was marginal, but it did appear possible.  It was opened.  It did accept a 1 mm probe, but it was a very poor quality vessel.  The anastomosis was performed with a running 7-0 Prolene suture.  The probe did pass distally at the completion of the anastomosis.  With cardioplegia administration down the graft, there was good flow and good hemostasis at both anastomoses.  Additional cardioplegia was administered via the aortic root.  The left internal mammary artery was brought through a window in the pericardium.  The distal end was beveled.  It was anastomosed end-to-side to the distal LAD.  The LAD was a 1.5 mm good quality target.  The mammary was a 1.5 mm good quality conduit.  The anastomosis was performed with a running 8-0 Prolene suture.  At the completion of the anastomosis, the bulldog clamp was briefly removed.  Rapid septal rewarming was noted.  The bulldog clamp was replaced and the mammary pedicle was tacked to the epicardial surface of the heart with 6-0 Prolene sutures.  The vein grafts were cut to length.  The cardioplegia cannula was removed from the ascending aorta.  The proximal vein graft anastomoses were performed to  4.5 mm punch aortotomies with running 6-0 Prolene sutures.  At the completion of the final proximal anastomosis, the patient was placed in Trendelenburg position.  Lidocaine was administered.  The aortic root was de-aired and the aortic cross clamp was removed.  The total cross clamp time was 85 minutes.  A test dose of protamine was administered and was well tolerated.  The atrial and aortic cannulae were removed.  The remainder of the protamine was administered without incident.  While rewarming was completed, all proximal and distal anastomoses were  inspected for hemostasis.   Epicardial pacing wires were placed on the right ventricle and right atrium.  When the patient had rewarmed to a core temperature of 37 degrees Celsius, he was weaned from cardiopulmonary bypass on the first attempt.  He was in sinus rhythm and did not require inotropic support.  The total bypass time was 124 minutes.  The initial cardiac index was greater than 2 liters per minute per meter squared and the patient remained hemodynamically stable throughout the post-bypass period.  Post bypass transesophageal echocardiography was unchanged from the pre-bypass study.  A test dose of protamine was administered and was well tolerated.  The atrial and aortic cannulae were removed.  The remainder of the protamine was administered without incident.  The chest was copiously irrigated with warm saline.  Hemostasis was achieved.  Left pleural and mediastinal chest tubes were placed through separate subcostal incisions.  The sternum was closed with a combination of single and double heavy-gauge stainless steel wires, pectoralis fascia, subcutaneous tissue, and skin were closed in a standard fashion.  All sponge, needle, and instrument counts were correct at the end of the procedure.  The patient was transported from the operating room to the surgical intensive care unit, intubated and in good condition.   PUS D: 05/15/2023 5:49:13 pm T: 05/15/2023 6:31:00 pm  JOB: [id number]/ [id number]

## 2023-05-15 NOTE — Anesthesia Procedure Notes (Signed)
 Central Venous Catheter Insertion Performed by: Leonides Grills, MD, anesthesiologist Start/End2/24/2025 10:10 AM, 05/15/2023 10:20 AM Patient location: Pre-op. Preanesthetic checklist: patient identified, IV checked, site marked, risks and benefits discussed, surgical consent, monitors and equipment checked, pre-op evaluation, timeout performed and anesthesia consent Position: supine Patient sedated Hand hygiene performed  and maximum sterile barriers used  PA cath was placed.Swan type:thermodilution PA Cath depth:46 Procedure performed without using ultrasound guided technique. Attempts: 1 Patient tolerated the procedure well with no immediate complications.

## 2023-05-15 NOTE — Progress Notes (Signed)
 PROGRESS NOTE  [name] ZOX:[id number] DOB: [birth year]   PCP: Donell Beers, FNP  Patient is from: Home.  DOA: 05/10/2023 LOS: 5  Chief complaints Chief Complaint  Patient presents with   Abdominal Pain   Chest Pain     Brief Narrative / Interim history: 63 year old male with history of diabetes type 2, hypertension, hyperlipidemia and tobacco use who presented from home with complaint of abdominal pain radiating to the chest.  He reported pain starting in the right lower quadrant and but it radiates to right side of his chest.  On presentation, troponin was elevated in the range of 3000.  EKG showed T wave inversions in V4, V5.  Case was discussed with cardiology.  Given aspirin, started on IV heparin.    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 TTE with LVEF of 45 to 50%, RWMA, G1 DD and possible LV thrombus..  Remains on IV heparin.  Plan for CABG on 05/15/2023.  Subjective: Seen and examined earlier this morning.  Sister and niece at bedside.  No complaints.  Ready for surgery  Objective: Vitals:   05/15/23 1003 05/15/23 1004 05/15/23 1005 05/15/23 1006  BP:   103/66   Pulse: 62 60 61 60  Resp: (!) 9 10 11  (!) 9  Temp:      TempSrc:      SpO2: 97% 98% 98% 97%  Weight:      Height:        Examination:  GENERAL: No apparent distress.  Nontoxic. HEENT: MMM.  Vision and hearing grossly intact.  NECK: Supple.  No apparent JVD.  RESP:  No IWOB.  Fair aeration bilaterally. CVS:  RRR. Heart sounds normal.  ABD/GI/GU: BS+. Abd soft, NTND.  MSK/EXT:  Moves extremities. No apparent deformity. No edema.  SKIN: no apparent skin lesion or wound NEURO: Awake, alert and oriented appropriately.  No apparent focal neuro deficit. PSYCH: Calm. Normal affect.   Procedures:  2/20-LHC with multivessel CAD  Microbiology summarized: None.  Assessment and plan: NSTEMI/multivessel CAD: Presented with abdominal pain radiating to chest.  Has significant risk factors: Diabetes,  hypertension, hyperlipidemia, tobacco use.  Elevated troponin on presentation with EKG changes.  LHC with multivessel CAD.  TTE with LVEF of 45 to 50%, RWMA, G1DD and possible LV thrombus..  A1c 6.7%.  LDL 104.  TSH normal.  Currently without cardiopulmonary symptoms.   -Remains on IV heparin, metoprolol, low-dose aspirin and Lipitor.   -Plan for CABG today, 04/2022 -Aggressive risk reductions.   -Encouraged smoking cessation.  -Follow lipoprotein a.  Chronic HFmrEF: TTE as above.  Appears compensated.  Not on diuretics at home. -Strict intake and output, daily weight, renal functions and electrolytes -GDMT per cardiology  LV mural thrombus: -Continue IV heparin  NIDDM-2 with hyperglycemia: Elevated CBG at 9 AM.  I do not think this is preprandial.  A1c 6.7%. Recent Labs  Lab 05/14/23 1119 05/14/23 1603 05/14/23 2158 05/15/23 0711 05/15/23 1001  GLUCAP 127* 127* 197* 123* 115*  -Continue SSI-moderate ACHS. -Continue holding metformin -He may benefit from SGLT2 inhibitors  History of hypertension: On lisinopril 2.5 mg daily at home.   -Continue metoprolol   Hyperlipidemia: Lipid panel showed LDL of 104.   -Continue Lipitor 80 mg daily   PAD: Follows-up with outpatient vascular surgery.  No signs of symptoms of ischemic disease currently.  -Continue aspirin and Lipitor.  Tobacco use disorder: Reports smoking 2 cigarettes a day. -Encouraged smoking cessation  Right leg pain/radiculopathy: Chronic.  No acute change -  Continue home gabapentin   Body mass index is 28.34 kg/m.          DVT prophylaxis:  On full dose anticoagulation.  Code Status: Full code Family Communication: Updated patient's sister and niece at bedside Level of care: ICU Status is: Inpatient Remains inpatient appropriate because: Non-STEMI/multivessel CAD   Final disposition: Home once medically cleared Consultants:  Cardiology Cardiothoracic surgery  35 minutes with more than 50% spent in  reviewing records, counseling patient/family and coordinating care.   Sch Meds:  Scheduled Meds:  [MAR Hold] aspirin EC  81 mg Oral Daily   [MAR Hold] atorvastatin  80 mg Oral Daily   chlorhexidine       chlorhexidine       epinephrine  0-10 mcg/min Intravenous To OR   [MAR Hold] gabapentin  300 mg Oral TID   heparin sodium (porcine) 2,500 Units, papaverine 30 mg in electrolyte-A (PLASMALYTE-A PH 7.4) 500 mL irrigation   Irrigation To OR   [MAR Hold] insulin aspart  0-15 Units Subcutaneous TID WC   [MAR Hold] insulin aspart  0-5 Units Subcutaneous QHS   insulin   Intravenous To OR   magnesium sulfate  40 mEq Other To OR   [MAR Hold] metoprolol tartrate  25 mg Oral BID   phenylephrine  30-200 mcg/min Intravenous To OR   potassium chloride  80 mEq Other To OR   [MAR Hold] sodium chloride flush  3 mL Intravenous Q12H   tranexamic acid  15 mg/kg Intravenous To OR   tranexamic acid  2 mg/kg Intracatheter To OR   Continuous Infusions:   ceFAZolin (ANCEF) IV      ceFAZolin (ANCEF) IV     dexmedetomidine     heparin 30,000 units/NS 1000 mL solution for CELLSAVER     heparin Stopped (05/15/23 0838)   milrinone     nitroGLYCERIN     norepinephrine     tranexamic acid (CYKLOKAPRON) 2,500 mg in sodium chloride 0.9 % 250 mL (10 mg/mL) infusion     vancomycin     PRN Meds:.[MAR Hold] acetaminophen, chlorhexidine, chlorhexidine, [MAR Hold] nitroGLYCERIN, [MAR Hold] ondansetron (ZOFRAN) IV, [MAR Hold] polyethylene glycol, [MAR Hold] senna-docusate, [MAR Hold] sodium chloride flush  Antimicrobials: Anti-infectives (From admission, onward)    Start     Dose/Rate Route Frequency Ordered Stop   05/15/23 0400  vancomycin (VANCOREADY) IVPB 1250 mg/250 mL        1,250 mg 166.7 mL/hr over 90 Minutes Intravenous To Surgery 05/12/23 1332 05/16/23 0400   05/15/23 0400  ceFAZolin (ANCEF) IVPB 2g/100 mL premix        2 g 200 mL/hr over 30 Minutes Intravenous To Surgery 05/12/23 1332 05/16/23 0400    05/15/23 0400  ceFAZolin (ANCEF) IVPB 2g/100 mL premix        2 g 200 mL/hr over 30 Minutes Intravenous To Surgery 05/12/23 1332 05/16/23 0400        I have personally reviewed the following labs and images: CBC: Recent Labs  Lab 05/12/23 0348 05/13/23 0322 05/14/23 0357 05/15/23 0008 05/15/23 0418  WBC 7.9 8.3 6.8 6.9 6.4  HGB 14.2 13.3 13.8 13.6 13.9  HCT 41.6 38.9* 40.5 39.7 40.5  MCV 91.6 91.3 91.8 92.1 92.0  PLT 210 192 205 187 204   BMP &GFR Recent Labs  Lab 05/10/23 1153 05/12/23 0348 05/13/23 0322 05/14/23 0357 05/15/23 0008 05/15/23 0418  NA 137 136 140 137 136  --   K 4.0 3.6 3.6 3.7 3.8  --  CL 103 103 106 102 103  --   CO2 26 22 23  21* 23  --   GLUCOSE 124* 122* 112* 141* 158*  --   BUN 16 18 20 14 15   --   CREATININE 0.69 0.74 0.67 0.67 0.74  --   CALCIUM 9.7 9.1 9.3 9.2 9.1  --   MG  --   --  2.0 2.0  --  2.1  PHOS  --   --  3.5 3.3  --   --    Estimated Creatinine Clearance: 83.4 mL/min (by C-G formula based on SCr of 0.74 mg/dL). Liver & Pancreas: Recent Labs  Lab 05/10/23 1153 05/13/23 0322 05/14/23 0357  AST 29  --   --   ALT 20  --   --   ALKPHOS 93  --   --   BILITOT 1.6*  --   --   PROT 8.0  --   --   ALBUMIN 4.3 3.1* 3.3*   Recent Labs  Lab 05/10/23 1153  LIPASE 35   No results for input(s): "AMMONIA" in the last 168 hours. Diabetic: No results for input(s): "HGBA1C" in the last 72 hours.  Recent Labs  Lab 05/14/23 1119 05/14/23 1603 05/14/23 2158 05/15/23 0711 05/15/23 1001  GLUCAP 127* 127* 197* 123* 115*   Cardiac Enzymes: No results for input(s): "CKTOTAL", "CKMB", "CKMBINDEX", "TROPONINI" in the last 168 hours. No results for input(s): "PROBNP" in the last 8760 hours. Coagulation Profile: Recent Labs  Lab 05/10/23 1540  INR 1.0   Thyroid Function Tests: No results for input(s): "TSH", "T4TOTAL", "FREET4", "T3FREE", "THYROIDAB" in the last 72 hours.  Lipid Profile: No results for input(s): "CHOL",  "HDL", "LDLCALC", "TRIG", "CHOLHDL", "LDLDIRECT" in the last 72 hours.  Anemia Panel: No results for input(s): "VITAMINB12", "FOLATE", "FERRITIN", "TIBC", "IRON", "RETICCTPCT" in the last 72 hours. Urine analysis:    Component Value Date/Time   COLORURINE YELLOW 05/10/2023 1153   APPEARANCEUR CLEAR 05/10/2023 1153   LABSPEC 1.012 05/10/2023 1153   PHURINE 6.0 05/10/2023 1153   GLUCOSEU NEGATIVE 05/10/2023 1153   HGBUR NEGATIVE 05/10/2023 1153   BILIRUBINUR NEGATIVE 05/10/2023 1153   BILIRUBINUR negative 12/16/2022 0922   KETONESUR NEGATIVE 05/10/2023 1153   PROTEINUR NEGATIVE 05/10/2023 1153   UROBILINOGEN 1.0 12/16/2022 0922   UROBILINOGEN 0.2 05/10/2010 0858   NITRITE NEGATIVE 05/10/2023 1153   LEUKOCYTESUR NEGATIVE 05/10/2023 1153   Sepsis Labs: Invalid input(s): "PROCALCITONIN", "LACTICIDVEN"  Microbiology: Recent Results (from the past 240 hours)  Surgical pcr screen     Status: None   Collection Time: 05/14/23  4:15 AM   Specimen: Nasal Mucosa; Nasal Swab  Result Value Ref Range Status   MRSA, PCR NEGATIVE NEGATIVE Final   Staphylococcus aureus NEGATIVE NEGATIVE Final    Comment: (NOTE) The Xpert SA Assay (FDA approved for NASAL specimens in patients 18 years of age and older), is one component of a comprehensive surveillance program. It is not intended to diagnose infection nor to guide or monitor treatment. Performed at [facility], [address], [place], [place] [address]   SARS CORONAVIRUS 2 (TAT 6-24 HRS) Anterior Nasal Swab     Status: None   Collection Time: 05/14/23  8:55 PM   Specimen: Anterior Nasal Swab  Result Value Ref Range Status   SARS Coronavirus 2 NEGATIVE NEGATIVE Final    Comment: (NOTE) SARS-CoV-2 target nucleic acids are NOT DETECTED.  The SARS-CoV-2 RNA is generally detectable in upper and lower respiratory specimens during the  acute phase of infection. Negative results do not preclude SARS-CoV-2 infection, do not rule  out co-infections with other pathogens, and should not be used as the sole basis for treatment or other patient management decisions. Negative results must be combined with clinical observations, patient history, and epidemiological information. The expected result is Negative.  Fact Sheet for Patients: HairSlick.no  Fact Sheet for Healthcare Providers: quierodirigir.com  This test is not yet approved or cleared by the [place] FDA and  has been authorized for detection and/or diagnosis of SARS-CoV-2 by FDA under an Emergency Use Authorization (EUA). This EUA will remain  in effect (meaning this test can be used) for the duration of the COVID-19 declaration under Se ction 564(b)(1) of the Act, 21 U.S.C. section 360bbb-3(b)(1), unless the authorization is terminated or revoked sooner.  Performed at [facility], [address], [place], [place] [address]     Radiology Studies: No results found.      [name] T. Tarell Schollmeyer [facility]  If 7PM-7AM, please contact night-coverage [email or web address] 05/15/2023, 11:22 AM

## 2023-05-15 NOTE — Hospital Course (Addendum)
 History of Present Illness:  [name] is a 63 year old gentleman with a past medical history notable for hypertension, type 2 diabetes mellitus, dyslipidemia, and tobacco use.  He had no prior cardiac history.  He presented to the [facility] emergency room on Wednesday, 05/10/2023 after having chest pain with associated abdominal pain several times over the previous 2 days.  He denied having any nausea, vomiting, or diaphoresis.  Once he arrived to the emergency room, he was not having any chest or abdominal pain and has not had any discomfort since.  Evaluation in the ED included an EKG that showed sinus rhythm with evidence of an anterior MI, age undetermined.  The high-sensitivity troponin level was greater than 3400.  A chest x-ray and CT of the abdomen and pelvis were unrevealing.  [name] was treated with aspirin and started on a heparin infusion for suspected acute non-ST elevation myocardial infarction.  He was transferred to [facility] for further evaluation.  He has remained stable since his initial presentation.  He underwent left heart catheterization yesterday demonstrating severe two-vessel coronary artery disease with a high-grade stenoses involving the ostium and proximal LAD as well as the first diagonal.  Additionally, there was severe stenosis of a small first OM branch of the circumflex and moderately severe stenosis of the distal circumflex.  He was dominant with mild nonobstructive disease.  Left ventriculogram showed severe inferoapical and periapical hypokinesis with a left ventricular ejection fraction of 45% as well as a thrombus at the LV anteroapex. CT surgery has been asked asked to evaluate for consideration of coronary bypass grafting.   [name] is currently resting in bed with IV heparin infusing.  He denies any chest discomfort or shortness of breath.  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  He is married but separated from his wife.  He has  several family members in the area that he said would be able to help him in recovery phase after surgery.  He has not visited a dentist in several years and tells me he has some loose teeth on the upper and lower jaw.  He denies any previous surgeries.  Dr. Dorris Fetch reviewed the patient's diagnostic studies and determined he would benefit from surgical intervention. He reviewed the patient's treatment options as well as the risks and benefits of surgery. [name] was agreeable to proceed with surgery.    Hospital Course: [name] was admitted to [facility] and remained in stable condition. He was brought to the operating room on 02/24 and underwent CABG x 4 utilizing LIMA to LAD, SVG to Diagonal and sequential SVG to Ramus Intermediate and OM,  as well as endoscopic harvest of the right greater saphenous vein. He tolerated the procedure well and was taken to the SICU in stable condition.  He was extubated the evening of surgery.  During his stay in the ICU the patient did not require inotropic support.  His chest tubes, arterial lines, and swan ganz catheter were removed without difficulty.  He was started on Lasix to help facilitate diuresis.  He has been transitioned off insulin drip to SSIP.  He will be resumed on Metformin prior to discharge.

## 2023-05-15 NOTE — Anesthesia Procedure Notes (Signed)
 Procedure Name: Intubation Date/Time: 05/15/2023 11:20 AM  Performed by: Sudie Grumbling, CRNAPre-anesthesia Checklist: Patient identified, Emergency Drugs available, Suction available and Patient being monitored Patient Re-evaluated:Patient Re-evaluated prior to induction Oxygen Delivery Method: Circle system utilized Preoxygenation: Pre-oxygenation with 100% oxygen Induction Type: IV induction Ventilation: Mask ventilation without difficulty Laryngoscope Size: Glidescope and 3 Grade View: Grade I Tube type: Oral Tube size: 8.0 mm Number of attempts: 1 Airway Equipment and Method: Stylet and Oral airway Placement Confirmation: ETT inserted through vocal cords under direct vision, positive ETCO2 and breath sounds checked- equal and bilateral Secured at: 22 cm Tube secured with: Tape Dental Injury: Teeth and Oropharynx as per pre-operative assessment

## 2023-05-16 ENCOUNTER — Encounter (HOSPITAL_COMMUNITY): Payer: Self-pay | Admitting: Thoracic Surgery (Cardiothoracic Vascular Surgery)

## 2023-05-16 ENCOUNTER — Other Ambulatory Visit (HOSPITAL_COMMUNITY): Payer: Self-pay

## 2023-05-16 ENCOUNTER — Inpatient Hospital Stay (HOSPITAL_COMMUNITY): Payer: MEDICAID

## 2023-05-16 LAB — CBC
HCT: 31 % — ABNORMAL LOW (ref 39.0–52.0)
Hemoglobin: 10.4 g/dL — ABNORMAL LOW (ref 13.0–17.0)
MCH: 31.3 pg (ref 26.0–34.0)
MCHC: 33.5 g/dL (ref 30.0–36.0)
MCV: 93.4 fL (ref 80.0–100.0)
Platelets: 124 10*3/uL — ABNORMAL LOW (ref 150–400)
RBC: 3.32 MIL/uL — ABNORMAL LOW (ref 4.22–5.81)
RDW: 12.8 % (ref 11.5–15.5)
WBC: 7.2 10*3/uL (ref 4.0–10.5)
nRBC: 0 % (ref 0.0–0.2)

## 2023-05-16 LAB — BASIC METABOLIC PANEL
Anion gap: 7 (ref 5–15)
Anion gap: 8 (ref 5–15)
BUN: 9 mg/dL (ref 8–23)
BUN: 10 mg/dL (ref 8–23)
CO2: 21 mmol/L — ABNORMAL LOW (ref 22–32)
CO2: 23 mmol/L (ref 22–32)
Calcium: 7.7 mg/dL — ABNORMAL LOW (ref 8.9–10.3)
Calcium: 7.9 mg/dL — ABNORMAL LOW (ref 8.9–10.3)
Chloride: 105 mmol/L (ref 98–111)
Chloride: 103 mmol/L (ref 98–111)
Creatinine, Ser: 0.59 mg/dL — ABNORMAL LOW (ref 0.61–1.24)
Creatinine, Ser: 0.62 mg/dL (ref 0.61–1.24)
GFR, Estimated: >60 mL/min (ref >60)
GFR, Estimated: >60 mL/min (ref >60)
Glucose, Bld: 138 mg/dL — ABNORMAL HIGH (ref 70–99)
Glucose, Bld: 98 mg/dL (ref 70–99)
Potassium: 3.7 mmol/L (ref 3.5–5.1)
Potassium: 4 mmol/L (ref 3.5–5.1)
Sodium: 133 mmol/L — ABNORMAL LOW (ref 135–145)
Sodium: 134 mmol/L — ABNORMAL LOW (ref 135–145)

## 2023-05-16 LAB — GLUCOSE, CAPILLARY
Glucose-Capillary: 101 mg/dL — ABNORMAL HIGH (ref 70–99)
Glucose-Capillary: 105 mg/dL — ABNORMAL HIGH (ref 70–99)
Glucose-Capillary: 121 mg/dL — ABNORMAL HIGH (ref 70–99)
Glucose-Capillary: 124 mg/dL — ABNORMAL HIGH (ref 70–99)
Glucose-Capillary: 126 mg/dL — ABNORMAL HIGH (ref 70–99)
Glucose-Capillary: 127 mg/dL — ABNORMAL HIGH (ref 70–99)
Glucose-Capillary: 130 mg/dL — ABNORMAL HIGH (ref 70–99)
Glucose-Capillary: 141 mg/dL — ABNORMAL HIGH (ref 70–99)
Glucose-Capillary: 154 mg/dL — ABNORMAL HIGH (ref 70–99)
Glucose-Capillary: 159 mg/dL — ABNORMAL HIGH (ref 70–99)
Glucose-Capillary: 186 mg/dL — ABNORMAL HIGH (ref 70–99)
Glucose-Capillary: 192 mg/dL — ABNORMAL HIGH (ref 70–99)
Glucose-Capillary: 96 mg/dL (ref 70–99)

## 2023-05-16 LAB — MAGNESIUM
Magnesium: 2.9 mg/dL — ABNORMAL HIGH (ref 1.7–2.4)
Magnesium: 2.7 mg/dL — ABNORMAL HIGH (ref 1.7–2.4)

## 2023-05-16 LAB — LIPOPROTEIN A (LPA): Lipoprotein (a): 209.3 nmol/L — ABNORMAL HIGH (ref <75.0)

## 2023-05-16 MED ORDER — INSULIN GLARGINE-YFGN 100 UNIT/ML ~~LOC~~ SOLN
12.0000 [IU] | Freq: Two times a day (BID) | SUBCUTANEOUS | Status: DC
  Filled 2023-05-16 (×2): qty 0.12

## 2023-05-16 MED ORDER — FUROSEMIDE 10 MG/ML IJ SOLN
20.0000 mg | Freq: Two times a day (BID) | INTRAMUSCULAR | Status: DC
  Administered 2023-05-16 – 2023-05-17 (×4): 20 mg via INTRAVENOUS
  Filled 2023-05-16 (×4): qty 2

## 2023-05-16 MED ORDER — POTASSIUM CHLORIDE 10 MEQ/50ML IV SOLN
10.0000 meq | INTRAVENOUS | Status: AC
  Administered 2023-05-16 (×2): 10 meq via INTRAVENOUS
  Filled 2023-05-16 (×2): qty 50

## 2023-05-16 MED ORDER — CHLORHEXIDINE GLUCONATE CLOTH 2 % EX PADS
6.0000 | MEDICATED_PAD | Freq: Every day | CUTANEOUS | Status: DC
  Administered 2023-05-16: 6 via TOPICAL

## 2023-05-16 MED ORDER — ENOXAPARIN SODIUM 40 MG/0.4ML IJ SOSY
40.0000 mg | PREFILLED_SYRINGE | Freq: Every day | INTRAMUSCULAR | Status: DC
  Administered 2023-05-16 – 2023-05-18 (×3): 40 mg via SUBCUTANEOUS
  Filled 2023-05-16 (×3): qty 0.4

## 2023-05-16 MED ORDER — INSULIN GLARGINE 100 UNIT/ML ~~LOC~~ SOLN
12.0000 [IU] | Freq: Two times a day (BID) | SUBCUTANEOUS | Status: DC
  Administered 2023-05-16 (×2): 12 [IU] via SUBCUTANEOUS
  Filled 2023-05-16 (×4): qty 0.12

## 2023-05-16 MED ORDER — INSULIN ASPART 100 UNIT/ML IJ SOLN
0.0000 [IU] | INTRAMUSCULAR | Status: DC
  Administered 2023-05-16 – 2023-05-17 (×3): 4 [IU] via SUBCUTANEOUS
  Administered 2023-05-17: 2 [IU] via SUBCUTANEOUS

## 2023-05-16 MED FILL — Sodium Chloride IV Soln 0.9%: INTRAVENOUS | Qty: 2000 | Status: AC

## 2023-05-16 MED FILL — Heparin Sodium (Porcine) Inj 1000 Unit/ML: INTRAMUSCULAR | Qty: 30 | Status: AC

## 2023-05-16 MED FILL — Heparin Sodium (Porcine) Inj 1000 Unit/ML: INTRAMUSCULAR | Qty: 10 | Status: AC

## 2023-05-16 MED FILL — Sodium Bicarbonate IV Soln 8.4%: INTRAVENOUS | Qty: 50 | Status: AC

## 2023-05-16 MED FILL — Electrolyte-R (PH 7.4) Solution: INTRAVENOUS | Qty: 4000 | Status: AC

## 2023-05-16 MED FILL — Lidocaine HCl Local Soln Prefilled Syringe 100 MG/5ML (2%): INTRAMUSCULAR | Qty: 5 | Status: AC

## 2023-05-16 NOTE — Plan of Care (Signed)
  Problem: Coping: Goal: Ability to adjust to condition or change in health will improve Outcome: Progressing   Problem: Nutritional: Goal: Maintenance of adequate nutrition will improve Outcome: Progressing   Problem: Education: Goal: Understanding of cardiac disease, CV risk reduction, and recovery process will improve Outcome: Progressing   Problem: Activity: Goal: Ability to tolerate increased activity will improve Outcome: Progressing   Problem: Cardiac: Goal: Ability to achieve and maintain adequate cardiovascular perfusion will improve Outcome: Progressing   Problem: Education: Goal: Knowledge of General Education information will improve Description: Including pain rating scale, medication(s)/side effects and non-pharmacologic comfort measures Outcome: Progressing   Problem: Cardiac: Goal: Will achieve and/or maintain hemodynamic stability Outcome: Progressing   Problem: Clinical Measurements: Goal: Postoperative complications will be avoided or minimized Outcome: Progressing   Problem: Respiratory: Goal: Respiratory status will improve Outcome: Progressing

## 2023-05-16 NOTE — Discharge Summary (Addendum)
 [address]       [place] [address]             [phone]    Physician Discharge Summary  Patient ID: [name] MRN: [id number] DOB/AGE: Oct 08, 1960 63 y.o.  Admit date: 05/10/2023 Discharge date: 05/19/2023  Admission Diagnoses:  Patient Active Problem List   Diagnosis Date Noted   Atherosclerotic heart disease 05/12/2023   Cigarette smoker 05/12/2023   Mixed hyperlipidemia 05/12/2023   Continuous dependence on cigarette smoking 05/11/2023   NSTEMI (non-ST elevated myocardial infarction) (HCC) 05/10/2023   PAD (peripheral artery disease) (HCC) 05/10/2023   Abnormal urine odor 12/16/2022   Perianal abscess 09/14/2022   Aortic atherosclerosis (HCC) 09/14/2022   Arthralgia of both lower legs 09/14/2022   Encounter for examination following treatment at hospital 09/14/2022   Chronic pain of left knee 07/26/2022   Tobacco abuse counseling 05/30/2022   Right leg pain 05/30/2022   Constipation 02/07/2022   Type 2 diabetes mellitus with hyperglycemia (HCC) 02/07/2022   Chronic pain of right knee 02/07/2022   Cervical stenosis of spine 02/07/2022   Primary osteoarthritis of right shoulder 02/07/2022   Primary hypertension 02/07/2022   Dyslipidemia, goal LDL below 70 02/07/2022   Discharge Diagnoses:  Patient Active Problem List   Diagnosis Date Noted   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   Atherosclerotic heart disease 05/12/2023   Cigarette smoker 05/12/2023   Mixed hyperlipidemia 05/12/2023   Continuous dependence on cigarette smoking 05/11/2023   NSTEMI (non-ST elevated myocardial infarction) (HCC) 05/10/2023   PAD (peripheral artery disease) (HCC) 05/10/2023   Abnormal urine odor 12/16/2022   Perianal abscess 09/14/2022   Aortic atherosclerosis (HCC) 09/14/2022   Arthralgia of both lower legs 09/14/2022   Encounter for examination following treatment at hospital 09/14/2022   Chronic pain of left knee 07/26/2022   Tobacco abuse counseling 05/30/2022    Right leg pain 05/30/2022   Constipation 02/07/2022   Type 2 diabetes mellitus with hyperglycemia (HCC) 02/07/2022   Chronic pain of right knee 02/07/2022   Cervical stenosis of spine 02/07/2022   Primary osteoarthritis of right shoulder 02/07/2022   Primary hypertension 02/07/2022   Dyslipidemia, goal LDL below 70 02/07/2022   Discharged Condition: stable  History of Present Illness:  [name] is a 63 year old gentleman with a past medical history notable for hypertension, type 2 diabetes mellitus, dyslipidemia, and tobacco use.  He had no prior cardiac history.  He presented to the [facility] emergency room on Wednesday, 05/10/2023 after having chest pain with associated abdominal pain several times over the previous 2 days.  He denied having any nausea, vomiting, or diaphoresis.  Once he arrived to the emergency room, he was not having any chest or abdominal pain and has not had any discomfort since.  Evaluation in the ED included an EKG that showed sinus rhythm with evidence of an anterior MI, age undetermined.  The high-sensitivity troponin level was greater than 3400.  A chest x-ray and CT of the abdomen and pelvis were unrevealing.  [name] was treated with aspirin and started on a heparin infusion for suspected acute non-ST elevation myocardial infarction.  He was transferred to [facility] for further evaluation.  He has remained stable since his initial presentation.  He underwent left heart catheterization yesterday demonstrating severe two-vessel coronary artery disease with a high-grade stenoses involving the ostium and proximal LAD as well as the first diagonal.  Additionally, there was severe stenosis of  a small first OM branch of the circumflex and moderately severe stenosis of the distal circumflex.  He was dominant with mild nonobstructive disease.  Left ventriculogram showed severe inferoapical and periapical hypokinesis with a left ventricular ejection fraction of 45% as  well as a thrombus at the LV anteroapex. CT surgery has been asked asked to evaluate for consideration of coronary bypass grafting.   Mr. Zalewski is currently resting in bed with IV heparin infusing.  He denies any chest discomfort or shortness of breath.  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  He is married but separated from his wife.  He has several family members in the area that he said would be able to help him in recovery phase after surgery.  He has not visited a dentist in several years and tells me he has some loose teeth on the upper and lower jaw.  He denies any previous surgeries.  Dr. Dorris Fetch reviewed the patient's diagnostic studies and determined he would benefit from surgical intervention. He reviewed the patient's treatment options as well as the risks and benefits of surgery. Mr. Osmond was agreeable to proceed with surgery.    Hospital Course: [name] was admitted to [facility] and remained in stable condition. He was brought to the operating room on 02/24 and underwent CABG x 4 utilizing LIMA to LAD, SVG to Diagonal and sequential SVG to Ramus Intermediate and OM,  as well as endoscopic harvest of the right greater saphenous vein. He tolerated the procedure well and was taken to the SICU in stable condition.  He was extubated the evening of surgery.  During his stay in the ICU the patient did not require inotropic support.  His chest tubes, arterial lines, and swan ganz catheter were removed without difficulty.  He was started on Lasix to help facilitate diuresis.  He has been transitioned off insulin drip to SSIP.  He was resumed on his home Metformin. Echocardiogram was suggestive of an LV thrombus, which was not noted on intraoperative TEE. This was discussed with Cardiology who recommended anticoagulation was not needed since there was no thrombus seen on TEE. He was maintaining NSR and his pacing wires were removed without difficulty.  He was felt stable  for transfer to the progressive care unit on 05/17/2023. His bowels began moving. He was started on Plavix for his history of NSTEMI. He was tolerating a diet and ambulating well. His incisions were healing well without sign of infection. He was felt stable for discharge.   Consults: None  Significant Diagnostic Studies: angiography:   1.  Patent left main with mild diffuse nonobstructive plaquing of up to 30% 2.  Severe ostial and proximal LAD stenosis of 75% with critical LAD stenosis at the first diagonal origin of 95% (lesion involves a large diagonal) 3.  Severe stenosis of a small first OM branch of the circumflex and moderately severe stenosis of the distal circumflex 4.  Patent, dominant RCA with mild nonobstructive plaquing 5.  Moderate segmental LV dysfunction with periapical and inferoapical severe hypokinesis, LVEF estimated at 45%.   Recommendations: Resume IV heparin, obtain cardiac surgical consultation for consideration of CABG.  The patient has significant stenosis involving the ostium of the LAD as well as disease involving the LAD/diagonal bifurcation.  ECHOCARDIOGRAM REPORT       Patient Name:   VIRAL SCHRAMM  Date of Exam: 05/12/2023 Medical Rec #:  [id number]  Height:       63.0 in  Accession #:    [id number] Weight:       160.0 lb Date of Birth:  [birth year]  BSA:          1.759 m Patient Age:    62 years   BP:           118/80 mmHg Patient Gender: M          HR:           73 bpm. Exam Location:  Inpatient  Procedure: 2D Echo, Cardiac Doppler and Color Doppler (Both Spectral and Color            Flow Doppler were utilized during procedure).  Indications:    Myocardial Infact I21,9   History:        Patient has no prior history of Echocardiogram examinations.                 Acute MI, PAD; Risk Factors:Hypertension, Diabetes and Current                 Smoker.   Sonographer:    Webb Laws Referring Phys: 63 MICHAEL COOPER  IMPRESSIONS    1. Left  ventricular ejection fraction, by estimation, is 45 to 50%. The left ventricle has mildly decreased function. The left ventricle demonstrates regional wall motion abnormalities (see scoring diagram/findings for description). There is mild concentric left ventricular hypertrophy. Left ventricular diastolic parameters are consistent with Grade I diastolic dysfunction (impaired relaxation).  2. Prominent trabeculation at LV apex. Definity contrast demonstrates slow flow and loosely organized mural thrombus at anteroapex as well.  3. Right ventricular systolic function is normal. The right ventricular size is normal. Tricuspid regurgitation signal is inadequate for assessing PA pressure.  4. The mitral valve is grossly normal. Trivial mitral valve regurgitation.  5. The aortic valve is tricuspid. Aortic valve regurgitation is not visualized. No aortic stenosis is present.  6. The inferior vena cava is normal in size with <50% respiratory variability, suggesting right atrial pressure of 8 mmHg.  Comparison(s): No prior Echocardiogram.  FINDINGS  Left Ventricle: Left ventricular ejection fraction, by estimation, is 45 to 50%. The left ventricle has mildly decreased function. The left ventricle demonstrates regional wall motion abnormalities. Strain imaging was not performed. The left ventricular  internal cavity size was normal in size. There is mild concentric left ventricular hypertrophy. Left ventricular diastolic parameters are consistent with Grade I diastolic dysfunction (impaired relaxation).    LV Wall Scoring: The apical septal segment, apical inferior segment, and apex are akinetic. The mid anteroseptal segment, apical lateral segment, mid inferoseptal segment, apical anterior segment, and mid inferior segment are hypokinetic. The anterior wall, antero-lateral wall, posterior wall, basal anteroseptal segment, basal inferior segment, and basal inferoseptal segment  are normal.  Right Ventricle: The right ventricular size is normal. No increase in right ventricular wall thickness. Right ventricular systolic function is normal. Tricuspid regurgitation signal is inadequate for assessing PA pressure.  Left Atrium: Left atrial size was normal in size.  Right Atrium: Right atrial size was normal in size.  Pericardium: There is no evidence of pericardial effusion.  Mitral Valve: The mitral valve is grossly normal. Trivial mitral valve regurgitation.  Tricuspid Valve: The tricuspid valve is grossly normal. Tricuspid valve regurgitation is trivial.  Aortic Valve: The aortic valve is tricuspid. Aortic valve regurgitation is not visualized. No aortic stenosis is present.  Pulmonic Valve: The pulmonic valve was grossly normal. Pulmonic valve regurgitation is trivial.  Aorta: The aortic root and ascending  aorta are structurally normal, with no evidence of dilitation.  Venous: The inferior vena cava is normal in size with less than 50% respiratory variability, suggesting right atrial pressure of 8 mmHg.  IAS/Shunts: No atrial level shunt detected by color flow Doppler.  Additional Comments: 3D imaging was not performed.    LEFT VENTRICLE PLAX 2D LVIDd:         5.00 cm      Diastology LVIDs:         3.10 cm      LV e' medial:    6.53 cm/s LV PW:         1.30 cm      LV E/e' medial:  7.7 LV IVS:        1.20 cm      LV e' lateral:   5.98 cm/s LVOT diam:     2.10 cm      LV E/e' lateral: 8.4 LV SV:         60 LV SV Index:   34 LVOT Area:     3.46 cm   LV Volumes (MOD) LV vol d, MOD A2C: 126.0 ml LV vol d, MOD A4C: 128.0 ml LV vol s, MOD A2C: 49.8 ml LV vol s, MOD A4C: 73.5 ml LV SV MOD A2C:     76.2 ml LV SV MOD A4C:     128.0 ml LV SV MOD BP:      70.9 ml  RIGHT VENTRICLE             IVC RV Basal diam:  4.00 cm     IVC diam: 2.10 cm RV Mid diam:    2.50 cm RV S prime:     13.10 cm/s TAPSE (M-mode): 1.8 cm  LEFT ATRIUM              Index        RIGHT ATRIUM           Index LA diam:        3.40 cm 1.93 cm/m   RA Area:     12.60 cm LA Vol (A2C):   32.8 ml 18.65 ml/m  RA Volume:   33.10 ml  18.82 ml/m LA Vol (A4C):   30.2 ml 17.17 ml/m LA Biplane Vol: 31.4 ml 17.85 ml/m  AORTIC VALVE LVOT Vmax:   85.50 cm/s LVOT Vmean:  58.500 cm/s LVOT VTI:    0.174 m   AORTA Ao Root diam: 3.40 cm Ao Asc diam:  3.40 cm  MITRAL VALVE MV Area (PHT): 4.21 cm    SHUNTS MV Decel Time: 180 msec    Systemic VTI:  0.17 m MV E velocity: 50.40 cm/s  Systemic Diam: 2.10 cm MV A velocity: 51.80 cm/s MV E/A ratio:  0.97  Nona Dell MD Electronically signed by Nona Dell MD Signature Date/Time: 05/12/2023/10:26:51 AM       Final      *INTRAOPERATIVE TRANSESOPHAGEAL REPORT *      Patient Name:   Voncille Lo      Date of Exam: 05/15/2023  Medical Rec #:  [id number]      Height:       63.0 in  Accession #:    [id number]     Weight:       160.0 lb  Date of Birth:  11/26/1960      BSA:          1.76 m  Patient Age:    21 years  BP:           103/66 mmHg  Patient Gender: M              HR:           62 bpm.  Exam Location:  Anesthesiology   Transesophogeal exam was perform intraoperatively during surgical  procedure.  Patient was closely monitored under general anesthesia during the entirety  of  examination.   Indications:    CABG  Performing Phys: Karna Christmas MD  Diagnosing Phys: Karna Christmas MD   Complications: No known complications during this procedure.  POST-OP IMPRESSIONS  Overall, there were no significant changes from pre-bypass.  _ Left Ventricle: The left ventricle is unchanged from pre-bypass.  _ Right Ventricle: The right ventricle appears unchanged from pre-bypass.  _ Aorta: The aorta appears unchanged from pre-bypass.  _ Left Atrium: The left atrium appears unchanged from pre-bypass.  _ Left Atrial Appendage: The left atrial appendage appears unchanged from  pre-bypass.  _ Aortic Valve:  The aortic valve appears unchanged from pre-bypass.  _ Mitral Valve: The mitral valve appears unchanged from pre-bypass.  _ Tricuspid Valve: The tricuspid valve appears unchanged from pre-bypass.  _ Pulmonic Valve: The pulmonic valve appears unchanged from pre-bypass.  _ Interatrial Septum: The interatrial septum appears unchanged from  pre-bypass.  _ Interventricular Septum: The interventricular septum appears unchanged  from  pre-bypass.  _ Pericardium: The pericardium appears unchanged from pre-bypass.   PRE-OP FINDINGS   Left Ventricle: The left ventricle has normal systolic function, with an  ejection fraction of 55-60%. The cavity size was normal. There is mildly  increased left ventricular wall thickness. There is mild concentric left  ventricular hypertrophy.   Right Ventricle: The right ventricle has normal systolic function. The  cavity was normal. There is no increase in right ventricular wall  thickness.   Left Atrium: Left atrial size was normal in size. No left atrial/left  atrial appendage thrombus was detected.   Right Atrium: Right atrial size was normal in size.   Interatrial Septum: No atrial level shunt detected by color flow Doppler.   Pericardium: There is no evidence of pericardial effusion.   Mitral Valve: The mitral valve is normal in structure. Mitral valve  regurgitation is trivial by color flow Doppler.   Tricuspid Valve: The tricuspid valve was normal in structure. Tricuspid  valve regurgitation was not visualized by color flow Doppler.   Aortic Valve: The aortic valve is normal in structure. Aortic valve  regurgitation was not visualized by color flow Doppler.   Pulmonic Valve: The pulmonic valve was normal in structure.  Pulmonic valve regurgitation is not visualized by color flow Doppler.    Aorta: The aortic root, ascending aorta and aortic arch are normal in size  and structure.     Karna Christmas MD  Electronically signed by Karna Christmas MD  Signature Date/Time: 05/15/2023/3:54:22 PM        Final      Treatments: surgery:   NAME: MARKAS, ALDREDGE MEDICAL RECORD NO: [id number] ACCOUNT NO: [id number] DATE OF BIRTH: 09-25-1960 FACILITY: [facility] LOCATION: [facility] PHYSICIAN: Salvatore Decent. Dorris Fetch, MD   Operative Report    DATE OF PROCEDURE: 05/15/2023   PREOPERATIVE DIAGNOSIS:  Severe two-vessel coronary artery disease status post non-ST elevation MI.   POSTOPERATIVE DIAGNOSIS:  Severe two-vessel coronary artery disease status post non-ST elevation MI.   PROCEDURE:  Median sternotomy, extracorporeal circulation, coronary artery bypass grafting x4 (left internal mammary artery to LAD, saphenous  vein graft to first diagonal, sequential saphenous vein graft to ramus intermedius and obtuse marginal 2),  endoscopic vein harvest right leg.   SURGEON:  Salvatore Decent. Dorris Fetch, MD   RESIDENT SURGEON:  Jayme Cloud, MD, [facility]   PHYSICIAN ASSISTANT:  Aloha Gell, PA-C  Discharge Exam: Blood pressure 119/78, pulse (!) 103, temperature 98.5 F (36.9 C), temperature source Oral, resp. rate 18, height 5\' 3"  (1.6 m), weight 67.9 kg, SpO2 98%. General appearance: alert, cooperative, and no distress Neurologic: intact Heart: regular rate and rhythm, S1, S2 normal, no murmur, click, rub or gallop Lungs: clear to auscultation bilaterally Abdomen: soft, non-tender; bowel sounds normal; no masses,  no organomegaly Extremities: extremities normal, atraumatic, no cyanosis or edema Wound: Clean and dry, no sign of infection  Discharge Medications:  The patient has been discharged on:   1.Beta Blocker:  Yes [  X ]                              No   [   ]                              If No, reason:  2.Ace Inhibitor/ARB: Yes [   ]                                     No  [  X  ]                                     If No, reason: Soft BP  3.Statin:   Yes [ X ]                  No  [   ]                  If No,  reason:  4.Ecasa:  Yes  [  X ]                  No   [   ]                  If No, reason:  Patient had ACS upon admission: Yes  Plavix/P2Y12 inhibitor: Yes [ X ]                                      No  [   ]     Discharge Instructions     Amb Referral to Cardiac Rehabilitation   Complete by: As directed    Diagnosis:  NSTEMI CABG     CABG X ___: 4   After initial evaluation and assessments completed: Virtual Based Care may be provided alone or in conjunction with Phase 2 Cardiac Rehab based on patient barriers.: Yes   Intensive Cardiac Rehabilitation (ICR) [facility] location only OR Traditional Cardiac Rehabilitation (TCR) *If criteria for ICR are not met will enroll in TCR [facility] only): Yes      Allergies as of 05/19/2023   No Known Allergies      Medication List     STOP taking these medications    ibuprofen  600 MG tablet Commonly known as: ADVIL   lisinopril 2.5 MG tablet Commonly known as: Zestril       TAKE these medications    acetaminophen 325 MG tablet Commonly known as: Tylenol Take 2 tablets (650 mg total) by mouth every 6 (six) hours as needed for mild pain (pain score 1-3).   aspirin EC 81 MG tablet Take 162 mg by mouth daily. Swallow whole.   atorvastatin 80 MG tablet Commonly known as: LIPITOR Tome 1 tableta (80 mg en total) por va oral diariamente. (Take 1 tablet (80 mg total) by mouth daily.) What changed:  medication strength how much to take   clopidogrel 75 MG tablet Commonly known as: PLAVIX Tome 1 tableta (75 mg en total) por va oral diariamente. (Take 1 tablet (75 mg total) by mouth daily.)   gabapentin 300 MG capsule Commonly known as: NEURONTIN Take 1 capsule (300 mg total) by mouth 3 (three) times daily. What changed: additional instructions   metFORMIN 1000 MG tablet Commonly known as: GLUCOPHAGE Take 1 tablet (1,000 mg total) by mouth 2 (two) times daily with a meal.   metoprolol tartrate 25 MG tablet Commonly known  as: LOPRESSOR Take 0.5 tablets (12.5 mg total) by mouth 2 (two) times daily.   oxyCODONE 5 MG immediate release tablet Commonly known as: Oxy IR/ROXICODONE Take 1 tablet (5 mg total) by mouth every 6 (six) hours as needed for severe pain (pain score 7-10).   polyethylene glycol 17 g packet Commonly known as: MiraLax Take 17 g by mouth daily. Or as needed for constipation        Follow-up Information     Sharlene Dory, PA-C Follow up on 06/06/2023.   Specialty: Cardiology Why: Appointment is at 10:55 Contact information: [address] [place] [address] [phone]         [facility] [place] Follow up on 06/14/2023.   Specialty: Cardiothoracic Surgery Why: Appointment is at 3:30.. please get CXR 1 hour prior to your appointment with Dr. Joneen Roach information: [address] [place], [address] [place] [address] [phone]         [facility] Follow up on 06/14/2023.   Why: Please get CXR at 2:30 prior to your appointment with Dr. Sunday Corn office Contact information: [address] [place] [address]        [place] [facility] Follow up on 05/25/2023.   Specialty: Cardiothoracic Surgery Why: Nurse visit for suture removal at 10:00AM Contact information: [address] [place], [address] [place] [address] [phone]                Signed:  Jenny Reichmann, PA-C  05/19/2023, 12:22 PM

## 2023-05-16 NOTE — Progress Notes (Signed)
      [address]       [place] [address]             [phone]      POD # 1 CABG  No complaints  BP 112/66   Pulse 85   Temp 97.9 F (36.6 C) (Oral)   Resp 16   Ht 5\' 3"  (1.6 m)   Wt 73.1 kg   SpO2 94%   BMI 28.55 kg/m   Intake/Output Summary (Last 24 hours) at 05/16/2023 1950 Last data filed at 05/16/2023 1701 Gross per 24 hour  Intake 1642.3 ml  Output 1996 ml  Net -353.7 ml   CBG moderately elevated  PM labs pending  Viviann Spare C. Dorris Fetch, MD [facility] [phone]

## 2023-05-16 NOTE — Progress Notes (Signed)
 1 Day Post-Op Procedure(s) (LRB): CORONARY ARTERY BYPASS GRAFTING (CABG) TIMES FOUR USING LEFT INTERNAL MAMMARY ARTERY AND ENDOSCOPICALLY HARVESTED RIGHT GREATER SAPHENOUS VEIN (N/A) TRANSESOPHAGEAL ECHOCARDIOGRAM (TEE) (N/A) Subjective: No complaints this AM, feels well  Objective: Vital signs in last 24 hours: Temp:  [94.1 F (34.5 C)-99.1 F (37.3 C)] 97.9 F (36.6 C) (02/25 0700) Pulse Rate:  [59-96] 88 (02/25 0700) Cardiac Rhythm: Normal sinus rhythm (02/25 0400) Resp:  [8-27] 15 (02/25 0700) BP: (79-125)/(56-76) 91/65 (02/24 2100) SpO2:  [94 %-100 %] 95 % (02/25 0700) Arterial Line BP: (42-331)/(0-275) 104/50 (02/25 0700) FiO2 (%):  [40 %-50 %] 40 % (02/24 1946) Weight:  [73.1 kg] 73.1 kg (02/25 0500)  Hemodynamic parameters for last 24 hours: PAP: (17-36)/(-11-21) 30/12 CVP:  [14 mmHg] 14 mmHg CO:  [4.6 L/min-6.6 L/min] 5.6 L/min CI:  [2.6 L/min/m2-3.7 L/min/m2] 3.2 L/min/m2  Intake/Output from previous day: 02/24 0701 - 02/25 0700 In: 4861.6 [P.O.:118; I.V.:2021.3; Blood:460; NG/GT:80; IV Piggyback:2182.3] Out: 3656 [Urine:2417; Blood:915; Chest Tube:324] Intake/Output this shift: No intake/output data recorded.  General appearance: alert, cooperative, and no distress Neurologic: intact Heart: regular rate and rhythm Lungs: diminished breath sounds bibasilar Abdomen: soft, nontender, + BS  Lab Results: Recent Labs    05/15/23 2326 05/16/23 0300  WBC 6.7 7.2  HGB 10.4* 10.4*  HCT 30.8* 31.0*  PLT 122* 124*   BMET:  Recent Labs    05/15/23 2326 05/16/23 0300  NA 133* 134*  K 3.7 4.0  CL 105 103  CO2 21* 23  GLUCOSE 138* 98  BUN 9 10  CREATININE 0.59* 0.62  CALCIUM 7.7* 7.9*    PT/INR:  Recent Labs    05/15/23 1743  LABPROT 15.6*  INR 1.2   ABG    Component Value Date/Time   PHART 7.336 (L) 05/15/2023 2144   HCO3 21.8 05/15/2023 2144   TCO2 23 05/15/2023 2144   ACIDBASEDEF 4.0 (H) 05/15/2023 2144   O2SAT 99 05/15/2023 2144   CBG  (last 3)  Recent Labs    05/16/23 0459 05/16/23 0603 05/16/23 0657  GLUCAP 121* 101* 105*    Assessment/Plan: S/P Procedure(s) (LRB): CORONARY ARTERY BYPASS GRAFTING (CABG) TIMES FOUR USING LEFT INTERNAL MAMMARY ARTERY AND ENDOSCOPICALLY HARVESTED RIGHT GREATER SAPHENOUS VEIN (N/A) TRANSESOPHAGEAL ECHOCARDIOGRAM (TEE) (N/A) POD # 1 NEURO- intact CV- in SR with good hemodynamics  Dc Swan and A line  ASA, beta blocker, statin, add Plavix PTD RESP_ IS for atelectasis RENAL- creatinine and lytes OK  IV Lasix ENDO- CBG well controlled  Transition from insulin drip to Sem Glee + SSI  Resume metformin tomorrow GI- diet as tolerated Dc chest tubes SCD + enoxaparin Ambulate  LOS: 6 days    [name] 05/16/2023

## 2023-05-17 ENCOUNTER — Inpatient Hospital Stay (HOSPITAL_COMMUNITY): Payer: MEDICAID

## 2023-05-17 LAB — CBC
HCT: 32.2 % — ABNORMAL LOW (ref 39.0–52.0)
Hemoglobin: 10.8 g/dL — ABNORMAL LOW (ref 13.0–17.0)
MCH: 31.7 pg (ref 26.0–34.0)
MCHC: 33.5 g/dL (ref 30.0–36.0)
MCV: 94.4 fL (ref 80.0–100.0)
Platelets: 119 10*3/uL — ABNORMAL LOW (ref 150–400)
RBC: 3.41 MIL/uL — ABNORMAL LOW (ref 4.22–5.81)
RDW: 13.2 % (ref 11.5–15.5)
WBC: 7.7 10*3/uL (ref 4.0–10.5)
nRBC: 0 % (ref 0.0–0.2)

## 2023-05-17 LAB — GLUCOSE, CAPILLARY
Glucose-Capillary: 162 mg/dL — ABNORMAL HIGH (ref 70–99)
Glucose-Capillary: 140 mg/dL — ABNORMAL HIGH (ref 70–99)
Glucose-Capillary: 153 mg/dL — ABNORMAL HIGH (ref 70–99)
Glucose-Capillary: 152 mg/dL — ABNORMAL HIGH (ref 70–99)
Glucose-Capillary: 106 mg/dL — ABNORMAL HIGH (ref 70–99)
Glucose-Capillary: 148 mg/dL — ABNORMAL HIGH (ref 70–99)

## 2023-05-17 LAB — BASIC METABOLIC PANEL
Anion gap: 10 (ref 5–15)
BUN: 10 mg/dL (ref 8–23)
CO2: 25 mmol/L (ref 22–32)
Calcium: 7.8 mg/dL — ABNORMAL LOW (ref 8.9–10.3)
Chloride: 99 mmol/L (ref 98–111)
Creatinine, Ser: 0.72 mg/dL (ref 0.61–1.24)
GFR, Estimated: >60 mL/min (ref >60)
Glucose, Bld: 145 mg/dL — ABNORMAL HIGH (ref 70–99)
Potassium: 3.6 mmol/L (ref 3.5–5.1)
Sodium: 134 mmol/L — ABNORMAL LOW (ref 135–145)

## 2023-05-17 MED ORDER — METFORMIN HCL 500 MG PO TABS
1000.0000 mg | ORAL_TABLET | Freq: Two times a day (BID) | ORAL | Status: DC
  Administered 2023-05-17 – 2023-05-19 (×5): 1000 mg via ORAL
  Filled 2023-05-17 (×5): qty 2

## 2023-05-17 MED ORDER — INSULIN ASPART 100 UNIT/ML IJ SOLN
0.0000 [IU] | Freq: Three times a day (TID) | INTRAMUSCULAR | Status: DC
  Administered 2023-05-17 (×2): 3 [IU] via SUBCUTANEOUS
  Administered 2023-05-18 – 2023-05-19 (×2): 2 [IU] via SUBCUTANEOUS

## 2023-05-17 MED ORDER — POTASSIUM CHLORIDE CRYS ER 20 MEQ PO TBCR
20.0000 meq | EXTENDED_RELEASE_TABLET | ORAL | Status: AC
  Administered 2023-05-17 (×3): 20 meq via ORAL
  Filled 2023-05-17 (×3): qty 1

## 2023-05-17 MED ORDER — INSULIN ASPART 100 UNIT/ML IJ SOLN: 0.0000 [IU] | Freq: Every day | INTRAMUSCULAR | Status: DC

## 2023-05-17 MED ORDER — INSULIN ASPART 100 UNIT/ML IJ SOLN: 0.0000 [IU] | Freq: Three times a day (TID) | INTRAMUSCULAR | Status: DC

## 2023-05-17 MED FILL — Magnesium Sulfate Inj 50%: INTRAMUSCULAR | Qty: 10 | Status: AC

## 2023-05-17 MED FILL — Heparin Sodium (Porcine) Inj 1000 Unit/ML: Qty: 1000 | Status: AC

## 2023-05-17 MED FILL — Potassium Chloride Inj 2 mEq/ML: INTRAVENOUS | Qty: 40 | Status: AC

## 2023-05-17 NOTE — Progress Notes (Signed)
 This RN D/Cd pts pacing wires per order and per protocol. NSR was noted on the monitor, pt tolerated well and no complications noted at the moment. Vital signs and monitoring per protocol.   Terri Piedra

## 2023-05-17 NOTE — Progress Notes (Signed)
 2 Days Post-Op Procedure(s) (LRB): CORONARY ARTERY BYPASS GRAFTING (CABG) TIMES FOUR USING LEFT INTERNAL MAMMARY ARTERY AND ENDOSCOPICALLY HARVESTED RIGHT GREATER SAPHENOUS VEIN (N/A) TRANSESOPHAGEAL ECHOCARDIOGRAM (TEE) (N/A) Subjective: Some incisional pain  Objective: Vital signs in last 24 hours: Temp:  [97.9 F (36.6 C)-98.7 F (37.1 C)] 98.7 F (37.1 C) (02/26 0700) Pulse Rate:  [81-99] 97 (02/26 0700) Cardiac Rhythm: Normal sinus rhythm (02/26 0000) Resp:  [10-24] 18 (02/26 0700) BP: (91-116)/(44-79) 112/44 (02/26 0700) SpO2:  [90 %-99 %] 98 % (02/26 0700) Arterial Line BP: (96-128)/(51-67) 96/53 (02/25 1000) Weight:  [70.2 kg] 70.2 kg (02/26 0500)  Hemodynamic parameters for last 24 hours: PAP: (28-31)/(12-16) 28/12  Intake/Output from previous day: 02/25 0701 - 02/26 0700 In: 453.4 [I.V.:156.6; IV Piggyback:296.8] Out: 2165 [Urine:2125; Chest Tube:40] Intake/Output this shift: No intake/output data recorded.  General appearance: alert, cooperative, and no distress Neurologic: intact Heart: regular rate and rhythm Lungs: diminished breath sounds bibasilar Abdomen: normal findings: soft, non-tender  Lab Results: Recent Labs    05/16/23 0300 05/17/23 0442  WBC 7.2 7.7  HGB 10.4* 10.8*  HCT 31.0* 32.2*  PLT 124* 119*   BMET:  Recent Labs    05/16/23 0300 05/17/23 0442  NA 134* 134*  K 4.0 3.6  CL 103 99  CO2 23 25  GLUCOSE 98 145*  BUN 10 10  CREATININE 0.62 0.72  CALCIUM 7.9* 7.8*    PT/INR:  Recent Labs    05/15/23 1743  LABPROT 15.6*  INR 1.2   ABG    Component Value Date/Time   PHART 7.336 (L) 05/15/2023 2144   HCO3 21.8 05/15/2023 2144   TCO2 23 05/15/2023 2144   ACIDBASEDEF 4.0 (H) 05/15/2023 2144   O2SAT 99 05/15/2023 2144   CBG (last 3)  Recent Labs    05/16/23 2127 05/17/23 0118 05/17/23 0440  GLUCAP 192* 162* 140*    Assessment/Plan: S/P Procedure(s) (LRB): CORONARY ARTERY BYPASS GRAFTING (CABG) TIMES FOUR USING  LEFT INTERNAL MAMMARY ARTERY AND ENDOSCOPICALLY HARVESTED RIGHT GREATER SAPHENOUS VEIN (N/A) TRANSESOPHAGEAL ECHOCARDIOGRAM (TEE) (N/A) POD # 2 CABG  Doing well NEURO - intact CV- in SR   Continue metoprolol, ASA, statin  Question LV thrombus on echo- not noted on intra-op TEE  Will d/w Cards re: if anticoagulation necessary  Dc pacing wires RESP- IS RENAL- creatinine and lytes OK  Diurese, supplement K, DC Foley ENDO- CBG mildly elevated  Change SSI to AC and HS GI- tolerating diet Anemia stable, monitor SCD + enoxaparin for DVT prophylaxis Transfer to 4E   LOS: 7 days    [name] 05/17/2023

## 2023-05-17 NOTE — Progress Notes (Signed)
 Mobility Specialist Progress Note:    05/17/23 1406  Mobility  Activity Ambulated with assistance in hallway;Ambulated with assistance in room  Level of Assistance Contact guard assist, steadying assist  Assistive Device Front wheel walker  Distance Ambulated (ft) 940 ft  Activity Response Tolerated well  Mobility Referral Yes  Mobility visit 1 Mobility  Mobility Specialist Start Time (ACUTE ONLY) 1350  Mobility Specialist Stop Time (ACUTE ONLY) 1400  Mobility Specialist Time Calculation (min) (ACUTE ONLY) 10 min   Pt requesting to ambulate in hallway. Required CGA for safety with RW. L knee buckling throughout session. Tolerated well, Max HR 112 bpm. Returned pt to room, sitting up in chair, all needs met.    Feliciana Rossetti Mobility Specialist Please contact via Special educational needs teacher or  Rehab office at [phone]

## 2023-05-17 NOTE — Plan of Care (Signed)
  Problem: Coping: Goal: Ability to adjust to condition or change in health will improve Outcome: Progressing   Problem: Fluid Volume: Goal: Ability to maintain a balanced intake and output will improve Outcome: Progressing   Problem: Nutritional: Goal: Maintenance of adequate nutrition will improve Outcome: Progressing   Problem: Skin Integrity: Goal: Risk for impaired skin integrity will decrease Outcome: Progressing   Problem: Activity: Goal: Ability to tolerate increased activity will improve Outcome: Progressing   Problem: Activity: Goal: Ability to return to baseline activity level will improve Outcome: Progressing

## 2023-05-17 NOTE — Plan of Care (Signed)
  Problem: Education: Goal: Understanding of cardiac disease, CV risk reduction, and recovery process will improve Outcome: Progressing   Problem: Nutrition: Goal: Adequate nutrition will be maintained Outcome: Progressing   Problem: Elimination: Goal: Will not experience complications related to urinary retention Outcome: Progressing

## 2023-05-18 LAB — GLUCOSE, CAPILLARY
Glucose-Capillary: 129 mg/dL — ABNORMAL HIGH (ref 70–99)
Glucose-Capillary: 119 mg/dL — ABNORMAL HIGH (ref 70–99)
Glucose-Capillary: 116 mg/dL — ABNORMAL HIGH (ref 70–99)
Glucose-Capillary: 121 mg/dL — ABNORMAL HIGH (ref 70–99)

## 2023-05-18 MED ORDER — LACTULOSE 10 GM/15ML PO SOLN
20.0000 g | Freq: Every day | ORAL | Status: DC
  Administered 2023-05-18: 20 g via ORAL
  Filled 2023-05-18: qty 30

## 2023-05-18 MED ORDER — POTASSIUM CHLORIDE CRYS ER 20 MEQ PO TBCR
40.0000 meq | EXTENDED_RELEASE_TABLET | Freq: Once | ORAL | Status: AC
  Administered 2023-05-18: 40 meq via ORAL
  Filled 2023-05-18: qty 2

## 2023-05-18 MED ORDER — LACTULOSE 10 GM/15ML PO SOLN: 20.0000 g | Freq: Once | ORAL | Status: DC

## 2023-05-18 NOTE — Progress Notes (Addendum)
 [address]       [place] [address]             [phone]      3 Days Post-Op Procedure(s) (LRB): CORONARY ARTERY BYPASS GRAFTING (CABG) TIMES FOUR USING LEFT INTERNAL MAMMARY ARTERY AND ENDOSCOPICALLY HARVESTED RIGHT GREATER SAPHENOUS VEIN (N/A) TRANSESOPHAGEAL ECHOCARDIOGRAM (TEE) (N/A) Subjective: Patient without new complaints. Has not had a bowel movement.   Objective: Vital signs in last 24 hours: Temp:  [98.1 F (36.7 C)-98.3 F (36.8 C)] 98.3 F (36.8 C) (02/27 0313) Pulse Rate:  [91-100] 100 (02/27 0313) Cardiac Rhythm: Normal sinus rhythm (02/26 1937) Resp:  [15-20] 20 (02/27 0313) BP: (99-131)/(63-85) 99/85 (02/27 0314) SpO2:  [96 %-98 %] 96 % (02/27 0314) Weight:  [69.7 kg] 69.7 kg (02/27 0314)  Hemodynamic parameters for last 24 hours:    Intake/Output from previous day: 02/26 0701 - 02/27 0700 In: 3 [I.V.:3] Out: 1000 [Urine:1000] Intake/Output this shift: No intake/output data recorded.  General appearance: alert, cooperative, and no distress Neurologic: intact Heart: regular rate and rhythm, S1, S2 normal, no murmur, click, rub or gallop Lungs: slightly diminished bibasilar breath sounds Abdomen: soft, non-tender; bowel sounds normal; no masses,  no organomegaly Extremities: extremities normal, atraumatic, no cyanosis or edema Wound: Clean and dry without sign of infection  Lab Results: Recent Labs    05/16/23 0300 05/17/23 0442  WBC 7.2 7.7  HGB 10.4* 10.8*  HCT 31.0* 32.2*  PLT 124* 119*   BMET:  Recent Labs    05/16/23 0300 05/17/23 0442  NA 134* 134*  K 4.0 3.6  CL 103 99  CO2 23 25  GLUCOSE 98 145*  BUN 10 10  CREATININE 0.62 0.72  CALCIUM 7.9* 7.8*    PT/INR:  Recent Labs    05/15/23 1743  LABPROT 15.6*  INR 1.2   ABG    Component Value Date/Time   PHART 7.336 (L) 05/15/2023 2144   HCO3 21.8 05/15/2023 2144   TCO2 23 05/15/2023 2144   ACIDBASEDEF 4.0 (H) 05/15/2023 2144   O2SAT 99  05/15/2023 2144   CBG (last 3)  Recent Labs    05/17/23 1632 05/17/23 2039 05/18/23 0605  GLUCAP 106* 148* 129*    Assessment/Plan: S/P Procedure(s) (LRB): CORONARY ARTERY BYPASS GRAFTING (CABG) TIMES FOUR USING LEFT INTERNAL MAMMARY ARTERY AND ENDOSCOPICALLY HARVESTED RIGHT GREATER SAPHENOUS VEIN (N/A) TRANSESOPHAGEAL ECHOCARDIOGRAM (TEE) (N/A)  Neuro: Pain controlled  CV: Controlled BP, SBP 99-120s. NSR, HR 90s. On Lopressor 12.5mg  BID. Possible LV thrombus on echo, not mentioned on intra-op TEE...discussing with cardiology if anticoagulation is necessary.   Pulm: Saturating well on RA. CXR yesterday with bibasilar atelectasis. Encourage ambulation and IS  GI: -BM. Tolerating a diet, no nausea or vomiting. Add lactulose.   Endo: T2DM, Preop A1C 6.7. CBGs 106/148/129, on home Metformin 1000mg  BID and SSI PRN.   Renal: Cr stable, 0.72. UO 1000cc/24hrs. IV Lasix 20mg  BID yesterday. Under preop weight, will d/c lasix. Mild hyponatremia, Na 134. Likely due to diuresis. K 3.6, supplement.   ID: No leukocytosis, afebrile  Expected postop ABLA: Last H/H 10.8/32.2, not clinically significant at this time. Reactive thrombocytopenia stable, plt 119,000.   DVT Prophylaxis: Lovenox  Dispo: Ambulating well. Work on [employer], can likely d/c home tomorrow AM if remains stable and +BM.   Addendum: Spoke with Dr. Wyline Mood with cardiology, she does not feel the patient requires anticoagulation since there was no thrombus seen on the intraoperative TEE.  LOS: 8 days    Jenny Reichmann, PA-C 05/18/2023  Patient seen and examined, agree with above Looks great Likely home tomorrow   Salvatore Decent. Dorris Fetch, MD [facility] [phone]

## 2023-05-18 NOTE — TOC Initial Note (Signed)
 Transition of Care (TOC) - Initial/Assessment Note  Donn Pierini RN, BSN Transitions of Care Unit 4E- RN Case Manager See Treatment Team for direct phone #   Patient Details  Name: [name] MRN: [id number] Date of Birth: 1960/07/19  [facility]) CM/SW Contact:    Darrold Span, RN Phone Number: 05/18/2023, 3:58 PM  Clinical Narrative:                 CM spoke with pt at bedside- order placed for DME- RW- per pt he already has RW at home, declines any other DME needs at this time.   Pt voiced that family will transport home,   Pt may need assistance with medications at discharge as he is un-insured- CM to follow up to see if pt needs MATCH assistance once scripts sent to [facility] pharmacy.   Expected Discharge Plan: Home/Self Care Barriers to Discharge: No Barriers Identified   Patient Goals and CMS Choice Patient states their goals for this hospitalization and ongoing recovery are:: return home CMS Medicare.gov Compare Post Acute Care list provided to:: Patient Choice offered to / list presented to : Patient      Expected Discharge Plan and Services   Discharge Planning Services: CM Consult, Medication Assistance Post Acute Care Choice: Durable Medical Equipment Living arrangements for the past 2 months: Single Family Home                 DME Arranged: Walker rolling (pt states he has RW at home) DME Agency: NA       HH Arranged: NA HH Agency: NA        Prior Living Arrangements/Services Living arrangements for the past 2 months: Single Family Home Lives with:: Self, Relatives Patient language and need for interpreter reviewed:: Yes Do you feel safe going back to the place where you live?: Yes      Need for Family Participation in Patient Care: Yes (Comment) Care giver support system in place?: Yes (comment) Current home services: DME (rolling walker) Criminal Activity/Legal Involvement Pertinent to Current Situation/Hospitalization: No -  Comment as needed  Activities of Daily Living   ADL Screening (condition at time of admission) Independently performs ADLs?: Yes (appropriate for developmental age) Is the patient deaf or have difficulty hearing?: No Does the patient have difficulty seeing, even when wearing glasses/contacts?: No Does the patient have difficulty concentrating, remembering, or making decisions?: No  Permission Sought/Granted                  Emotional Assessment Appearance:: Appears stated age Attitude/Demeanor/Rapport: Engaged Affect (typically observed): Accepting Orientation: : Oriented to Self, Oriented to Place, Oriented to  Time, Oriented to Situation Alcohol / Substance Use: Not Applicable Psych Involvement: No (comment)  Admission diagnosis:  NSTEMI (non-ST elevated myocardial infarction) (HCC) [I21.4] S/P CABG (coronary artery bypass graft) [Z95.1] Patient Active Problem List   Diagnosis Date Noted   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   Atherosclerotic heart disease 05/12/2023   Cigarette smoker 05/12/2023   Mixed hyperlipidemia 05/12/2023   Continuous dependence on cigarette smoking 05/11/2023   NSTEMI (non-ST elevated myocardial infarction) (HCC) 05/10/2023   PAD (peripheral artery disease) (HCC) 05/10/2023   Abnormal urine odor 12/16/2022   Perianal abscess 09/14/2022   Aortic atherosclerosis (HCC) 09/14/2022   Arthralgia of both lower legs 09/14/2022   Encounter for examination following treatment at hospital 09/14/2022   Chronic pain of left knee 07/26/2022   Tobacco abuse counseling 05/30/2022   Right leg  pain 05/30/2022   Constipation 02/07/2022   Type 2 diabetes mellitus with hyperglycemia (HCC) 02/07/2022   Chronic pain of right knee 02/07/2022   Cervical stenosis of spine 02/07/2022   Primary osteoarthritis of right shoulder 02/07/2022   Primary hypertension 02/07/2022   Dyslipidemia, goal LDL below 70 02/07/2022   PCP:  Donell Beers, FNP Pharmacy:   [facility] [address] [address] Phone: [phone] Fax: [phone]     Social Drivers of Health (SDOH) Social History: SDOH Screenings   Food Insecurity: No Food Insecurity (05/11/2023)  Housing: Patient Declined (05/14/2023)  Transportation Needs: No Transportation Needs (05/11/2023)  Utilities: Not At Risk (05/11/2023)  Depression (PHQ2-9): Low Risk  (12/16/2022)  Tobacco Use: High Risk (05/15/2023)   SDOH Interventions:     Readmission Risk Interventions     No data to display

## 2023-05-18 NOTE — Progress Notes (Signed)
 Mobility Specialist Progress Note:    05/18/23 1140  Mobility  Activity Ambulated with assistance in hallway  Level of Assistance Standby assist, set-up cues, supervision of patient - no hands on  Assistive Device Front wheel walker  Distance Ambulated (ft) 940 ft  Activity Response Tolerated well  Mobility Referral Yes  Mobility visit 1 Mobility  Mobility Specialist Start Time (ACUTE ONLY) 1130  Mobility Specialist Stop Time (ACUTE ONLY) 1140  Mobility Specialist Time Calculation (min) (ACUTE ONLY) 10 min   Pt received ambulating out of room. Accompanied pt in hallway as SB. Tolerated well with RW, reported fatigue near EOS. Max HR 109 bpm. Returned pt to room, sitting up in chair comfortably with all needs met and call bell in reach.   Feliciana Rossetti Mobility Specialist Please contact via Special educational needs teacher or  Rehab office at [phone]

## 2023-05-18 NOTE — Progress Notes (Addendum)
 CARDIAC REHAB PHASE I   PRE:  Rate/Rhythm: 100 ST  BP:  Sitting: 115/80      SpO2: 92 RA  MODE:  Ambulation: 940 ft    POST:  Rate/Rhythm: 113 ST  BP:  Sitting: 114/86      SpO2: 91 RA  Pt eager to mobilize in the hallway, pt walked with RW an standby assistance. Pt able to complete two laps without stopping and was returned to chair. Pt received verbal education and written materials on diet and exercise. Pt was also educated on his restrictions, IS use, and CR. Pt does not have insurance to cover CR program, will refer to meet protocol.   Faustino Congress  MS, ACSM-CEP 9:25 AM 05/18/2023    Service time is from 0850 to [id number]. '

## 2023-05-19 ENCOUNTER — Other Ambulatory Visit (HOSPITAL_COMMUNITY): Payer: Self-pay

## 2023-05-19 ENCOUNTER — Other Ambulatory Visit: Payer: Self-pay

## 2023-05-19 LAB — GLUCOSE, CAPILLARY
Glucose-Capillary: 105 mg/dL — ABNORMAL HIGH (ref 70–99)
Glucose-Capillary: 149 mg/dL — ABNORMAL HIGH (ref 70–99)

## 2023-05-19 MED ORDER — POLYETHYLENE GLYCOL 3350 17 G PO PACK: 17.0000 g | PACK | Freq: Every day | ORAL | Status: DC

## 2023-05-19 MED ORDER — OXYCODONE HCL 5 MG PO TABS
5.0000 mg | ORAL_TABLET | Freq: Four times a day (QID) | ORAL | 0 refills | Status: DC | PRN
  Filled 2023-05-19 (×2): qty 28, 7d supply, fill #0

## 2023-05-19 MED ORDER — ASPIRIN 81 MG PO TBEC
81.0000 mg | DELAYED_RELEASE_TABLET | Freq: Every day | ORAL | Status: DC
Start: 2023-05-19 — End: 2023-05-19

## 2023-05-19 MED ORDER — METOPROLOL TARTRATE 25 MG PO TABS
12.5000 mg | ORAL_TABLET | Freq: Two times a day (BID) | ORAL | 1 refills | Status: DC
  Filled 2023-05-19 (×2): qty 60, 60d supply, fill #0

## 2023-05-19 MED ORDER — ACETAMINOPHEN 325 MG PO TABS: 650.0000 mg | ORAL_TABLET | Freq: Four times a day (QID) | ORAL | Status: AC | PRN

## 2023-05-19 MED ORDER — CLOPIDOGREL BISULFATE 75 MG PO TABS
75.0000 mg | ORAL_TABLET | Freq: Every day | ORAL | 1 refills | Status: DC
  Filled 2023-05-19 (×2): qty 30, 30d supply, fill #0

## 2023-05-19 MED ORDER — LACTULOSE 10 GM/15ML PO SOLN
20.0000 g | Freq: Once | ORAL | Status: AC
  Administered 2023-05-19: 20 g via ORAL
  Filled 2023-05-19: qty 30

## 2023-05-19 MED ORDER — CLOPIDOGREL BISULFATE 75 MG PO TABS
75.0000 mg | ORAL_TABLET | Freq: Every day | ORAL | Status: DC
  Administered 2023-05-19: 75 mg via ORAL
  Filled 2023-05-19: qty 1

## 2023-05-19 MED ORDER — ATORVASTATIN CALCIUM 80 MG PO TABS
80.0000 mg | ORAL_TABLET | Freq: Every day | ORAL | 1 refills | Status: DC
  Filled 2023-05-19 (×2): qty 30, 30d supply, fill #0

## 2023-05-19 NOTE — Progress Notes (Addendum)
 [address]       [place] [address]             [phone]      4 Days Post-Op Procedure(s) (LRB): CORONARY ARTERY BYPASS GRAFTING (CABG) TIMES FOUR USING LEFT INTERNAL MAMMARY ARTERY AND ENDOSCOPICALLY HARVESTED RIGHT GREATER SAPHENOUS VEIN (N/A) TRANSESOPHAGEAL ECHOCARDIOGRAM (TEE) (N/A) Subjective: The patient complains of abdominal pain overnight. He states he went poop yesterday but it was very hard. Patient asked to stay another day or two to ensure his abdominal discomfort resolved.   Objective: Vital signs in last 24 hours: Temp:  [97.5 F (36.4 C)-98.6 F (37 C)] 98 F (36.7 C) (02/28 0505) Pulse Rate:  [85-102] 85 (02/28 0505) Cardiac Rhythm: Normal sinus rhythm (02/28 0430) Resp:  [14-20] 17 (02/28 0505) BP: (93-119)/(70-76) 110/72 (02/28 0505) SpO2:  [96 %-98 %] 98 % (02/28 0505) Weight:  [67.9 kg] 67.9 kg (02/28 0505)  Hemodynamic parameters for last 24 hours:    Intake/Output from previous day: 02/27 0701 - 02/28 0700 In: 360 [P.O.:360] Out: 900 [Urine:900] Intake/Output this shift: No intake/output data recorded.  General appearance: alert, cooperative, and no distress Neurologic: intact Heart: regular rate and rhythm, S1, S2 normal, no murmur, click, rub or gallop Lungs: clear to auscultation bilaterally Abdomen: soft, non-tender; bowel sounds normal; no masses,  no organomegaly Extremities: extremities normal, atraumatic, no cyanosis or edema Wound: Clean and dry, no sign of infection  Lab Results: Recent Labs    05/17/23 0442  WBC 7.7  HGB 10.8*  HCT 32.2*  PLT 119*   BMET:  Recent Labs    05/17/23 0442  NA 134*  K 3.6  CL 99  CO2 25  GLUCOSE 145*  BUN 10  CREATININE 0.72  CALCIUM 7.8*    PT/INR: No results for input(s): "LABPROT", "INR" in the last 72 hours. ABG    Component Value Date/Time   PHART 7.336 (L) 05/15/2023 2144   HCO3 21.8 05/15/2023 2144   TCO2 23 05/15/2023 2144   ACIDBASEDEF 4.0 (H)  05/15/2023 2144   O2SAT 99 05/15/2023 2144   CBG (last 3)  Recent Labs    05/18/23 1656 05/18/23 2145 05/19/23 0633  GLUCAP 116* 121* 105*    Assessment/Plan: S/P Procedure(s) (LRB): CORONARY ARTERY BYPASS GRAFTING (CABG) TIMES FOUR USING LEFT INTERNAL MAMMARY ARTERY AND ENDOSCOPICALLY HARVESTED RIGHT GREATER SAPHENOUS VEIN (N/A) TRANSESOPHAGEAL ECHOCARDIOGRAM (TEE) (N/A)  Neuro: Pain controlled   CV: Controlled BP, SBP 90s-120. NSR, HR 90s-100, up to 110s with ambulation. On Lopressor 12.5mg  BID, BP prohibitive of further titration. No anticoagulation needed since thrombus was not seen on TEE as discussed with cardiology.    Pulm: Saturating well on RA. Last CXR with bibasilar atelectasis. Encourage ambulation and IS.   GI: +BM yesterday, tolerating a diet. Patient with abdominal pain and tightness last night and this AM, states he had a very hard BM yesterday. Abdominal exam was benign without pain to palpation. Will give another dose of lactulose this AM and recommended stool softeners as an outpatient. Likely constipated from pain medication.    Endo: T2DM, Preop A1C 6.7. CBGs 116/121/105, on home Metformin 1000mg  BID and SSI PRN.    Renal: Last Cr stable, 0.72. UO 900cc/24hrs. Under preop weight, lasix has been d/c'd.    Expected postop ABLA: Last H/H 10.8/32.2, not clinically significant at this time. Reactive thrombocytopenia stable, plt 119,000.    DVT Prophylaxis: Lovenox   Dispo: Hopefully abdominal discomfort will resolve with  another dose of lactulose. D/C home today vs tomorrow, will discuss with Dr. Dorris Fetch.    LOS: 9 days    Jenny Reichmann, PA-C 05/19/2023 I have seen and examined [name] Looks great.  He tells me his pain was yesterday before he had a BM I think he is ready for dc this afternoon  Viviann Spare C. Dorris Fetch, MD [facility] [phone]

## 2023-05-19 NOTE — Plan of Care (Signed)
 Pt alert and oriented. Spanish speaking but able to understand and communicate in [place]. Pt has been ambulating in the room with standby assist. Sternal incision CDI, painted with betadine. Sternal precautions reiterated, incentive spirometry encouraged. Pt continues to experience chest pain with getting up, addressed with PRN pain meds as needed. Pt given a bath today. Will continue to monitor pt.   Problem: Pain Managment: Goal: General experience of comfort will improve and/or be controlled Outcome: Progressing   Problem: Safety: Goal: Ability to remain free from injury will improve Outcome: Progressing   Problem: Education: Goal: Will demonstrate proper wound care and an understanding of methods to prevent future damage Outcome: Progressing Goal: Knowledge of disease or condition will improve Outcome: Progressing Goal: Knowledge of the prescribed therapeutic regimen will improve Outcome: Progressing   Problem: Activity: Goal: Risk for activity intolerance will decrease Outcome: Progressing   Problem: Cardiac: Goal: Will achieve and/or maintain hemodynamic stability Outcome: Progressing

## 2023-05-19 NOTE — Progress Notes (Signed)
 Orlan Leavens - Medical Interpreter [phone] used

## 2023-05-19 NOTE — Progress Notes (Signed)
 Mobility Specialist Progress Note:   05/19/23 1047  Mobility  Activity Ambulated with assistance in hallway;Ambulated with assistance in room  Level of Assistance Standby assist, set-up cues, supervision of patient - no hands on  Assistive Device Front wheel walker  Distance Ambulated (ft) 940 ft  RUE Weight Bearing Per Provider Order NWB  LUE Weight Bearing Per Provider Order NWB  Activity Response Tolerated well  Mobility Referral Yes  Mobility visit 1 Mobility  Mobility Specialist Start Time (ACUTE ONLY) 1040  Mobility Specialist Stop Time (ACUTE ONLY) 1047  Mobility Specialist Time Calculation (min) (ACUTE ONLY) 7 min   Pt agreeable to ambulate with MS for mobility session. Required SBA with RW for safety. Tolerated well, asx throughout. Max HR 120 bpm. Returned pt to room, sitting up in chair, all needs met, call bell in reach.    Feliciana Rossetti Mobility Specialist Please contact via Special educational needs teacher or  Rehab office at [phone]

## 2023-05-19 NOTE — Progress Notes (Signed)
 CARDIAC REHAB PHASE I    Pt just returned from walk in hallway. Mobilizing well with good understanding of sternal precautions. Reviewed postop [facility] education, no questions or concerns. Referral for CRP2 sent to [facility]. Will continue to follow.   [id number] Woodroe Chen, RN BSN 05/19/2023 10:20 AM

## 2023-05-19 NOTE — Plan of Care (Signed)
 Agree with medication regimen. No LV thrombus seen in the intra-op TEE.

## 2023-05-25 ENCOUNTER — Ambulatory Visit: Payer: Self-pay

## 2023-05-25 DIAGNOSIS — Z4802 Encounter for removal of sutures: Secondary | ICD-10-CM

## 2023-05-25 NOTE — Progress Notes (Signed)
 Patient arrived for nurse visit to remove suture/staples post- procedure CABG with Dr. Dorris Fetch 05/15/23.  Two Sutures removed with no signs/ symptoms of infection noted.  Patient tolerated procedure well.  Patient/ family instructed to keep the incision sites clean and dry.  Patient/ family acknowledged instructions given.  Patient was stating that he was having pain and says that he has not taken his Oxycodone in a couple days. Advised that it is important to take it when needed. He acknowledged receipt. Had medication with him and states he will take it when he gets in the car.

## 2023-05-29 ENCOUNTER — Ambulatory Visit: Payer: Self-pay | Admitting: Nurse Practitioner

## 2023-05-31 ENCOUNTER — Telehealth (HOSPITAL_COMMUNITY): Payer: Self-pay

## 2023-05-31 NOTE — Progress Notes (Signed)
 [address]       [name] [address]             [phone]       HPI: [name] is a 63 year old male with a past medical history of hypertension, type 2 diabetes mellitus, dyslipidemia, NSTEMI, and tobacco use. He presents today with an interpreter Anjie. The patient returns for routine postoperative follow-up having undergone CABG x 4 by Dr. Dorris Fetch on 05/15/23. The patient had a routine postoperative hospital stay and was started on Plavix for NSTEMI prior to discharge. He saw cardiology on 03/18 and was noted to be doing well but did note some generalized weakness and numbness and weakness of the left hand. He was also noted to have some peripheral neuropathy of his lower extremities.  Since hospital discharge the patient reports a little chest soreness but he has only used Oxycodone about 4 or 5 times. He denies shortnes of breath, lower extremity edema, dizziness and LOC. He admits to walking about 2 blocks daily. He also complains of numbness and tingling in bilateral lower extremities and bilateral hands. This was present prior to surgery but the left arm numbness has worsened since surgery. He does admit to a history of cervical stenosis of the spine that he never underwent treatment for.    Current Outpatient Medications  Medication Sig Dispense Refill   acetaminophen (TYLENOL) 325 MG tablet Take 2 tablets (650 mg total) by mouth every 6 (six) hours as needed for mild pain (pain score 1-3).     aspirin EC 81 MG tablet Take 162 mg by mouth daily. Swallow whole.     atorvastatin (LIPITOR) 80 MG tablet Take 1 tablet (80 mg total) by mouth daily. 30 tablet 1   clopidogrel (PLAVIX) 75 MG tablet Take 1 tablet (75 mg total) by mouth daily. 30 tablet 1   gabapentin (NEURONTIN) 300 MG capsule Take 1 capsule (300 mg total) by mouth 3 (three) times daily. (Patient taking differently: Take 300 mg by mouth 3 (three) times daily. Take 300 mg by mouth two to three times a  day) 90 capsule 3   metFORMIN (GLUCOPHAGE) 1000 MG tablet Take 1 tablet (1,000 mg total) by mouth 2 (two) times daily with a meal. 120 tablet 2   metoprolol tartrate (LOPRESSOR) 25 MG tablet Take 0.5 tablets (12.5 mg total) by mouth 2 (two) times daily. 60 tablet 1   oxyCODONE (OXY IR/ROXICODONE) 5 MG immediate release tablet Take 1 tablet (5 mg total) by mouth every 6 (six) hours as needed for severe pain (pain score 7-10). 28 tablet 0   polyethylene glycol (MIRALAX) 17 g packet Take 17 g by mouth daily. Or as needed for constipation     No current facility-administered medications for this visit.   Vitals: Today's Vitals   06/14/23 1524  BP: 122/74  Pulse: 76  Resp: 18  SpO2: 98%  Weight: 150 lb (68 kg)  Height: 5\' 3"  (1.6 m)   Body mass index is 26.57 kg/m.  Physical Exam: General: Alert and oriented, no acute distress Neuro: Grossly intact CV: Regular rate and rhythm, no murmur Pulm: Clear to auscultation bilaterally GI: +BS, nontender Extremities: No edema BLE Wound: Healing well without sign of infection  Diagnostic Tests: Patient did not get CXR prior to appointment, he was told to get this following his appointment and given the address but it does not look like he went.   Impression/Plan: S/P CABG: The  patient is overall doing well from surgery. He has minimal chest pain and has needed minimal Oxycodone. He is ambulating with a cane that he needed prior to surgery due to a knee injury. He admits he is walking about 2 blocks a day without difficulty. He has no shortness of breath or cough but he never got his CXR. His incisions are healing well without sign of infection and his appetite is improving. He was out of most of his medications including Plavix and had not been taking Aspirin. I told the patient how important these medications were and refilled his Plavix and Metformin. Jari Favre PA-C refilled his Atorvastatin and Lopressor at his cardiology visit but he never  picked them up. We discussed continued sternal precautions for 3 months. I also cleared him for driving and cardiac rehab today. Plan to have the patient return to the clinic as needed.   Peripheral neuropathy: He does admit to numbness, tingling and burning of BLE, cardiology may get ABIs at the next visit and he continues to take gabapentin prescribed by his PCP.   Cervical stenosis: He admits to numbness and tingling of bilateral hands that was present prior to surgery and he believes is due to his neck problems that he had prior to surgery but nothing was done for it. He also admits that his left arm numbness and tingling has worsened since surgery. There is a possibility there was a brachial plexus injury during surgery or surgery could have worsened cervical stenosis symptoms due to positioning. If this is a brachial plexus injury it should improve with time. I recommended he discuss his cervical stenosis and peripheral neuropathy with his PCP at appointment next week.    Jenny Reichmann, PA-C [facility] [phone]

## 2023-05-31 NOTE — Telephone Encounter (Signed)
 Per Phase I note, " Pt does not have insurance to cover CR program, will refer to meet protocol."   Closed referral

## 2023-06-05 NOTE — Progress Notes (Unsigned)
 Cardiology Office Note:  .   Date:  06/06/2023  ID:  [name], DOB [birth year], MRN [id number] PCP: Donell Beers, FNP  [facility] Providers Cardiologist:  None {   History of Present Illness: .   [name] is a 63 y.o. male with a past medical history of hypertension, type 2 diabetes mellitus, hyperlipidemia and tobacco use who presented to [facility] emergency room on Wednesday, 05/10/2023 after having chest pain with associated abdominal pain for several hours over the last 2 days.  Ultimately, ended up with coronary bypass surgery.  Here for follow-up.  History includes presenting to the ER after associated chest pain and abdominal pain several times over the past 2 days prior to ER visit.  When he arrived he was not having any chest pain or abdominal pain and had not had any discomfort while inpatient.  Evaluation in the ED included EKG which showed sinus rhythm with evidence of anterior MI, age undetermined.  High sensitivity troponin level was greater than 3400.  Chest x-ray and CT of the abdomen/pelvis were unrevealing.  She with aspirin and started on heparin infusion for suspected non-ST elevation MI.  Transferred to [facility].  She underwent LHC which demonstrated high-grade stenosis involving the ostium and proximal LAD as well as the first diagonal.  Referred to surgery.  Also left ventriculogram showed severe inferior apical and periapical hypokinesis with left ventricular ejection fraction of 45% as well as a thrombus in the LV anterior apex.  Ultimately underwent coronary artery bypass grafting x 4 utilizing LIMA to LAD, SVG to diagonal and sequential SVG to ramus intermediate and OM on 05/15/2023.  Tolerate procedure well.  No significant postoperative complications.  Today, he presents, who recently underwent open heart surgery, reports feeling generally well post-operatively. However, he experiences numbness in the left hand and general weakness. He also reports  difficulty in getting up from bed due to lack of assistance at home. Despite these challenges, the patient has been able to walk about a block for exercise. He expresses interest in cardiac rehabilitation, but is concerned about the cost due to lack of insurance. The patient is on atorvastatin for cholesterol management and has been advised to increase the dosage. He also reports feeling weak and dizzy, which could be due to possible dehydration. The patient also experiences some numbness and pain in the legs, particularly at night. His diet consists mainly of soups and vegetables, with some concern about the sodium content in the soups. He also experiences some chest soreness but has not needed his oxycodone.  Reports no shortness of breath nor dyspnea on exertion. Reports no chest pain, pressure, or tightness. No edema, orthopnea, PND. Reports no palpitations.   Discussed the use of AI scribe software for clinical note transcription with the patient, who gave verbal consent to proceed.  ROS: Pertinent ROS in HPI  Studies Reviewed: .        Significant Diagnostic Studies: angiography:    1.  Patent left main with mild diffuse nonobstructive plaquing of up to 30% 2.  Severe ostial and proximal LAD stenosis of 75% with critical LAD stenosis at the first diagonal origin of 95% (lesion involves a large diagonal) 3.  Severe stenosis of a small first OM branch of the circumflex and moderately severe stenosis of the distal circumflex 4.  Patent, dominant RCA with mild nonobstructive plaquing 5.  Moderate segmental LV dysfunction with periapical and inferoapical severe hypokinesis, LVEF estimated at 45%.   Recommendations:  Resume IV heparin, obtain cardiac surgical consultation for consideration of CABG.  The patient has significant stenosis involving the ostium of the LAD as well as disease involving the LAD/diagonal bifurcation.   ECHOCARDIOGRAM REPORT       Patient Name:   [name]  Date of  Exam: 05/12/2023 Medical Rec #:  [id number]  Height:       63.0 in Accession #:    [id number] Weight:       160.0 lb Date of Birth:  06-Nov-1960  BSA:          1.759 m Patient Age:    62 years   BP:           118/80 mmHg Patient Gender: M          HR:           73 bpm. Exam Location:  Inpatient  Procedure: 2D Echo, Cardiac Doppler and Color Doppler (Both Spectral and Color            Flow Doppler were utilized during procedure).  Indications:    Myocardial Infact I21,9   History:        Patient has no prior history of Echocardiogram examinations.                 Acute MI, PAD; Risk Factors:Hypertension, Diabetes and Current                 Smoker.   Sonographer:    Webb Laws Referring Phys: 38 MICHAEL COOPER  IMPRESSIONS    1. Left ventricular ejection fraction, by estimation, is 45 to 50%. The left ventricle has mildly decreased function. The left ventricle demonstrates regional wall motion abnormalities (see scoring diagram/findings for description). There is mild concentric left ventricular hypertrophy. Left ventricular diastolic parameters are consistent with Grade I diastolic dysfunction (impaired relaxation).  2. Prominent trabeculation at LV apex. Definity contrast demonstrates slow flow and loosely organized mural thrombus at anteroapex as well.  3. Right ventricular systolic function is normal. The right ventricular size is normal. Tricuspid regurgitation signal is inadequate for assessing PA pressure.  4. The mitral valve is grossly normal. Trivial mitral valve regurgitation.  5. The aortic valve is tricuspid. Aortic valve regurgitation is not visualized. No aortic stenosis is present.  6. The inferior vena cava is normal in size with <50% respiratory variability, suggesting right atrial pressure of 8 mmHg.  Comparison(s): No prior Echocardiogram.  FINDINGS  Left Ventricle: Left ventricular ejection fraction, by estimation, is 45 to 50%. The left  ventricle has mildly decreased function. The left ventricle demonstrates regional wall motion abnormalities. Strain imaging was not performed. The left ventricular  internal cavity size was normal in size. There is mild concentric left ventricular hypertrophy. Left ventricular diastolic parameters are consistent with Grade I diastolic dysfunction (impaired relaxation).    LV Wall Scoring: The apical septal segment, apical inferior segment, and apex are akinetic. The mid anteroseptal segment, apical lateral segment, mid inferoseptal segment, apical anterior segment, and mid inferior segment are hypokinetic. The anterior wall, antero-lateral wall, posterior wall, basal anteroseptal segment, basal inferior segment, and basal inferoseptal segment are normal.  Right Ventricle: The right ventricular size is normal. No increase in right ventricular wall thickness. Right ventricular systolic function is normal. Tricuspid regurgitation signal is inadequate for assessing PA pressure.  Left Atrium: Left atrial size was normal in size.  Right Atrium: Right atrial size was normal in size.  Pericardium: There is no evidence of pericardial effusion.  Mitral Valve: The mitral valve is grossly normal. Trivial mitral valve regurgitation.  Tricuspid Valve: The tricuspid valve is grossly normal. Tricuspid valve regurgitation is trivial.  Aortic Valve: The aortic valve is tricuspid. Aortic valve regurgitation is not visualized. No aortic stenosis is present.  Pulmonic Valve: The pulmonic valve was grossly normal. Pulmonic valve regurgitation is trivial.  Aorta: The aortic root and ascending aorta are structurally normal, with no evidence of dilitation.  Venous: The inferior vena cava is normal in size with less than 50% respiratory variability, suggesting right atrial pressure of 8 mmHg.  IAS/Shunts: No atrial level shunt detected by color flow Doppler.  Additional Comments: 3D imaging was  not performed.          Physical Exam:   VS:  BP 102/60 (Cuff Size: Normal)   Pulse 84   Ht 5\' 3"  (1.6 m)   Wt 147 lb 3.2 oz (66.8 kg)   SpO2 96%   BMI 26.08 kg/m    Wt Readings from Last 3 Encounters:  06/06/23 147 lb 3.2 oz (66.8 kg)  05/19/23 149 lb 11.1 oz (67.9 kg)  12/16/22 150 lb 9.6 oz (68.3 kg)    GEN: Well nourished, well developed in no acute distress NECK: No JVD; No carotid bruits CARDIAC: RRR, no murmurs, rubs, gallops RESPIRATORY:  Clear to auscultation without rales, wheezing or rhonchi  ABDOMEN: Soft, non-tender, non-distended EXTREMITIES:  No edema; No deformity   ASSESSMENT AND PLAN: .    Postoperative care following coronary artery bypass grafting (CABG) Recovering from CABG. Left hand numbness likely from nerve stretching. No chest pain. Incisions healing well. Weakness and hypotension likely from dehydration. Not on analgesics. Cardiac rehab not pursued due to insurance issues. - Avoid large movements for first few weeks. - Increase water intake to four bottles daily, preferably before 6 PM. - Monitor left hand numbness, report if persistent. - Avoid analgesics unless necessary.  Dietary management post-CABG Advised Mediterranean diet with focus on lean meats, fish, vegetables, fruits, and complex carbohydrates. Avoid high sodium foods, keep sodium under 2 grams daily to prevent fluid accumulation and cardiac strain. - Provide Mediterranean diet literature in Spanish. - Avoid high sodium foods, keep sodium intake under 2 grams daily.  Hyperlipidemia On atorvastatin 80 mg for hyperlipidemia. Dosage clarified. Plan to recheck lipid panel in eight weeks. Advised fasting before test. - Take atorvastatin 80 mg daily, use two 40 mg tablets if necessary. - Recheck lipid panel in eight weeks. - Fast for 12 hours before lipid panel test.  Peripheral neuropathy Reports numbness, coldness, and burning sensation in legs, especially at night. Symptoms worsened  over the past year. Vascular studies normal. Uses walking stick due to fear of falling. - consider ABIs at next visit -no claudication  Dispo: He can follow-up in a few months  Signed, Sharlene Dory, PA-C

## 2023-06-06 ENCOUNTER — Ambulatory Visit: Payer: Self-pay | Attending: Physician Assistant | Admitting: Physician Assistant

## 2023-06-06 ENCOUNTER — Other Ambulatory Visit (HOSPITAL_COMMUNITY): Payer: Self-pay

## 2023-06-06 VITALS — BP 102/60 | HR 84 | Ht 63.0 in | Wt 147.2 lb

## 2023-06-06 DIAGNOSIS — I2510 Atherosclerotic heart disease of native coronary artery without angina pectoris: Secondary | ICD-10-CM

## 2023-06-06 DIAGNOSIS — I10 Essential (primary) hypertension: Secondary | ICD-10-CM

## 2023-06-06 DIAGNOSIS — E785 Hyperlipidemia, unspecified: Secondary | ICD-10-CM

## 2023-06-06 DIAGNOSIS — I739 Peripheral vascular disease, unspecified: Secondary | ICD-10-CM

## 2023-06-06 DIAGNOSIS — E1165 Type 2 diabetes mellitus with hyperglycemia: Secondary | ICD-10-CM

## 2023-06-06 MED ORDER — METOPROLOL TARTRATE 25 MG PO TABS
12.5000 mg | ORAL_TABLET | Freq: Two times a day (BID) | ORAL | 3 refills | Status: DC
  Filled 2023-06-06: qty 90, 90d supply, fill #0
  Filled 2024-01-09: qty 30, 30d supply, fill #0
  Filled 2024-02-23 – 2024-03-07 (×2): qty 30, 30d supply, fill #1

## 2023-06-06 MED ORDER — ATORVASTATIN CALCIUM 80 MG PO TABS
80.0000 mg | ORAL_TABLET | Freq: Every day | ORAL | 3 refills | Status: DC
  Filled 2023-06-06: qty 90, 90d supply, fill #0

## 2023-06-06 NOTE — Patient Instructions (Signed)
 Medication Instructions:  Your physician has requested that you have an echocardiogram. Echocardiography is a painless test that uses sound waves to create images of your heart. It provides your doctor with information about the size and shape of your heart and how well your heart's chambers and valves are working. This procedure takes approximately one hour. There are no restrictions for this procedure. Please do NOT wear cologne, perfume, aftershave, or lotions (deodorant is allowed). Please arrive 15 minutes prior to your appointment time.  Please note: We ask at that you not bring children with you during ultrasound (echo/ vascular) testing. Due to room size and safety concerns, children are not allowed in the ultrasound rooms during exams. Our front office staff cannot provide observation of children in our lobby area while testing is being conducted. An adult accompanying a patient to their appointment will only be allowed in the ultrasound room at the discretion of the ultrasound technician under special circumstances. We apologize for any inconvenience.  *If you need a refill on your cardiac medications before your next appointment, please call your pharmacy*   Lab Work: Fasting lipid panel in 8 weeks If you have labs (blood work) drawn today and your tests are completely normal, you will receive your results only by: MyChart Message (if you have MyChart) OR A paper copy in the mail If you have any lab test that is abnormal or we need to change your treatment, we will call you to review the results.   Testing/Procedures: NONE   Follow-Up: At [facility], you and your health needs are our priority.  As part of our continuing mission to provide you with exceptional heart care, we have created designated Provider Care Teams.  These Care Teams include your primary Cardiologist (physician) and Advanced Practice Providers (APPs -  Physician Assistants and Nurse Practitioners) who  all work together to provide you with the care you need, when you need it.  We recommend signing up for the patient portal called "MyChart".  Sign up information is provided on this After Visit Summary.  MyChart is used to connect with patients for Virtual Visits (Telemedicine).  Patients are able to view lab/test results, encounter notes, upcoming appointments, etc.  Non-urgent messages can be sent to your provider as well.   To learn more about what you can do with MyChart, go to [email or web address].    Your next appointment:   3 month(s)  Provider:   Lynnette Caffey, MD  Other Instructions   1st Floor: - Lobby - Registration  - Pharmacy  - Lab - Cafe  2nd Floor: - PV Lab - Diagnostic Testing (echo, CT, nuclear med)  3rd Floor: - Vacant  4th Floor: - TCTS (cardiothoracic surgery) - AFib Clinic - Structural Heart Clinic - Vascular Surgery  - Vascular Ultrasound  5th Floor: - [facility] (general and EP) - Clinical Pharmacy for coumadin, hypertension, lipid, weight-loss medications, and med management appointments    Valet parking services will be available as well.

## 2023-06-07 ENCOUNTER — Other Ambulatory Visit: Payer: Self-pay | Admitting: Nurse Practitioner

## 2023-06-07 DIAGNOSIS — E1165 Type 2 diabetes mellitus with hyperglycemia: Secondary | ICD-10-CM

## 2023-06-07 DIAGNOSIS — G8929 Other chronic pain: Secondary | ICD-10-CM

## 2023-06-07 DIAGNOSIS — M4802 Spinal stenosis, cervical region: Secondary | ICD-10-CM

## 2023-06-07 NOTE — Telephone Encounter (Signed)
 Copied from CRM [id number]. Topic: Clinical - Medication Refill >> Jun 07, 2023 12:03 PM Higinio Roger wrote: Most Recent Primary Care Visit:  Provider: Nilda Simmer T  Department: [facility]  Visit Type: OFFICE VISIT  Date: 02/02/2023  Medication: metFORMIN (GLUCOPHAGE) 1000 MG tablet  clopidogrel (PLAVIX) 75 MG tablet  gabapentin (NEURONTIN) 300 MG capsule  metoprolol tartrate (LOPRESSOR) 25 MG tablet  atorvastatin (LIPITOR) 80 MG tablet  oxyCODONE (OXY IR/ROXICODONE) 5 MG immediate release tablet   Has the patient contacted their pharmacy? Yes (Agent: If no, request that the patient contact the pharmacy for the refill. If patient does not wish to contact the pharmacy document the reason why and proceed with request.) (Agent: If yes, when and what did the pharmacy advise?)  Is this the correct pharmacy for this prescription? Yes If no, delete pharmacy and type the correct one.  This is the patient's preferred pharmacy:  [facility] [address] [place] [address] Phone: [phone] Fax: [phone]   Has the prescription been filled recently? Yes  Is the patient out of the medication? Yes  Has the patient been seen for an appointment in the last year OR does the patient have an upcoming appointment? Yes  Can we respond through MyChart? No. Call [phone]  Agent: Please be advised that Rx refills may take up to 3 business days. We ask that you follow-up with your pharmacy.

## 2023-06-07 NOTE — Telephone Encounter (Signed)
 Copied from CRM [id number]. Topic: Clinical - Medication Refill >> Jun 07, 2023 11:58 AM [name] wrote: Most Recent Primary Care Visit:  Provider: Nilda Simmer T  Department: [facility]  Visit Type: OFFICE VISIT  Date: 02/02/2023  Medication: metFORMIN (GLUCOPHAGE) 1000 MG tablet  clopidogrel (PLAVIX) 75 MG tablet  gabapentin (NEURONTIN) 300 MG capsule  metoprolol tartrate (LOPRESSOR) 25 MG tablet  atorvastatin (LIPITOR) 80 MG tablet   Has the patient contacted their pharmacy? Yes (Agent: If no, request that the patient contact the pharmacy for the refill. If patient does not wish to contact the pharmacy document the reason why and proceed with request.) (Agent: If yes, when and what did the pharmacy advise?)  Is this the correct pharmacy for this prescription? Yes If no, delete pharmacy and type the correct one.  This is the patient's preferred pharmacy:   [facility] [address] [place] [address] Phone: [phone] Fax: [phone]   Has the prescription been filled recently? Yes  Is the patient out of the medication? Yes  Has the patient been seen for an appointment in the last year OR does the patient have an upcoming appointment? Yes  Can we respond through MyChart? No. Call [phone]  Agent: Please be advised that Rx refills may take up to 3 business days. We ask that you follow-up with your pharmacy.

## 2023-06-07 NOTE — Telephone Encounter (Signed)
 Lipitor refilled yesterday 06/06/23. Metformin refills available at pharmacy. Plavix refilled 05/19/23, refills at pharmacy. Gabapentin refills at pharmacy. Metoprolol refilled yesterday 06/06/23.

## 2023-06-12 ENCOUNTER — Other Ambulatory Visit: Payer: Self-pay | Admitting: Thoracic Surgery (Cardiothoracic Vascular Surgery)

## 2023-06-12 DIAGNOSIS — Z951 Presence of aortocoronary bypass graft: Secondary | ICD-10-CM

## 2023-06-13 NOTE — Patient Instructions (Incomplete)
 Continue to avoid any heavy lifting or strenuous use of your arms or shoulders for at least a total of three months from the time of surgery.  After three months you may gradually increase how much you lift or otherwise use your arms or chest as tolerated, with limits based upon whether or not activities lead to the return of significant discomfort.  You may return to driving an automobile as long as you are no longer requiring oral narcotic pain relievers during the daytime.  It would be wise to start driving only short distances during the daylight and gradually increase from there as you feel comfortable.  You are encouraged to enroll and participate in the outpatient cardiac rehab program beginning as soon as practical.

## 2023-06-14 ENCOUNTER — Ambulatory Visit (INDEPENDENT_AMBULATORY_CARE_PROVIDER_SITE_OTHER): Payer: Self-pay | Admitting: Physician Assistant

## 2023-06-14 ENCOUNTER — Encounter: Payer: Self-pay | Admitting: Internal Medicine

## 2023-06-14 ENCOUNTER — Encounter: Payer: Self-pay | Admitting: Physician Assistant

## 2023-06-14 ENCOUNTER — Other Ambulatory Visit (HOSPITAL_COMMUNITY): Payer: Self-pay

## 2023-06-14 VITALS — BP 122/74 | HR 76 | Resp 18 | Ht 63.0 in | Wt 150.0 lb

## 2023-06-14 DIAGNOSIS — E1165 Type 2 diabetes mellitus with hyperglycemia: Secondary | ICD-10-CM

## 2023-06-14 DIAGNOSIS — Z951 Presence of aortocoronary bypass graft: Secondary | ICD-10-CM

## 2023-06-14 MED ORDER — METFORMIN HCL 1000 MG PO TABS
1000.0000 mg | ORAL_TABLET | Freq: Two times a day (BID) | ORAL | 2 refills | Status: DC
  Filled 2023-06-14: qty 120, 60d supply, fill #0

## 2023-06-14 MED ORDER — CLOPIDOGREL BISULFATE 75 MG PO TABS
75.0000 mg | ORAL_TABLET | Freq: Every day | ORAL | 1 refills | Status: DC
  Filled 2023-06-14: qty 30, 30d supply, fill #0

## 2023-06-15 ENCOUNTER — Other Ambulatory Visit: Payer: Self-pay

## 2023-06-15 ENCOUNTER — Encounter (HOSPITAL_COMMUNITY): Payer: Self-pay

## 2023-06-15 ENCOUNTER — Emergency Department (HOSPITAL_COMMUNITY): Payer: MEDICAID

## 2023-06-15 ENCOUNTER — Inpatient Hospital Stay (HOSPITAL_COMMUNITY)
Admission: EM | Admit: 2023-06-15 | Discharge: 2023-06-18 | DRG: 281 | Disposition: A | Discharge status: Home / Self Care | Payer: MEDICAID | Attending: Cardiovascular Disease | Admitting: Cardiovascular Disease

## 2023-06-15 ENCOUNTER — Ambulatory Visit
Admission: RE | Admit: 2023-06-15 | Discharge: 2023-06-15 | Disposition: A | Discharge status: Home / Self Care | Payer: Self-pay | Source: Ambulatory Visit | Attending: Thoracic Surgery (Cardiothoracic Vascular Surgery) | Admitting: Thoracic Surgery (Cardiothoracic Vascular Surgery)

## 2023-06-15 DIAGNOSIS — Z7982 Long term (current) use of aspirin: Secondary | ICD-10-CM

## 2023-06-15 DIAGNOSIS — R7989 Other specified abnormal findings of blood chemistry: Secondary | ICD-10-CM

## 2023-06-15 DIAGNOSIS — I10 Essential (primary) hypertension: Secondary | ICD-10-CM | POA: Diagnosis present

## 2023-06-15 DIAGNOSIS — R079 Chest pain, unspecified: Secondary | ICD-10-CM

## 2023-06-15 DIAGNOSIS — I700 Atherosclerosis of aorta: Secondary | ICD-10-CM | POA: Diagnosis present

## 2023-06-15 DIAGNOSIS — I252 Old myocardial infarction: Secondary | ICD-10-CM

## 2023-06-15 DIAGNOSIS — Z79899 Other long term (current) drug therapy: Secondary | ICD-10-CM

## 2023-06-15 DIAGNOSIS — I25810 Atherosclerosis of coronary artery bypass graft(s) without angina pectoris: Secondary | ICD-10-CM | POA: Diagnosis present

## 2023-06-15 DIAGNOSIS — F17210 Nicotine dependence, cigarettes, uncomplicated: Secondary | ICD-10-CM | POA: Diagnosis present

## 2023-06-15 DIAGNOSIS — Z7984 Long term (current) use of oral hypoglycemic drugs: Secondary | ICD-10-CM

## 2023-06-15 DIAGNOSIS — Z833 Family history of diabetes mellitus: Secondary | ICD-10-CM

## 2023-06-15 DIAGNOSIS — Z951 Presence of aortocoronary bypass graft: Secondary | ICD-10-CM

## 2023-06-15 DIAGNOSIS — Z603 Acculturation difficulty: Secondary | ICD-10-CM | POA: Diagnosis present

## 2023-06-15 DIAGNOSIS — E118 Type 2 diabetes mellitus with unspecified complications: Secondary | ICD-10-CM | POA: Diagnosis present

## 2023-06-15 DIAGNOSIS — E7800 Pure hypercholesterolemia, unspecified: Secondary | ICD-10-CM | POA: Diagnosis present

## 2023-06-15 DIAGNOSIS — E785 Hyperlipidemia, unspecified: Secondary | ICD-10-CM | POA: Diagnosis present

## 2023-06-15 DIAGNOSIS — I214 Non-ST elevation (NSTEMI) myocardial infarction: Principal | ICD-10-CM | POA: Diagnosis present

## 2023-06-15 DIAGNOSIS — Z7902 Long term (current) use of antithrombotics/antiplatelets: Secondary | ICD-10-CM

## 2023-06-15 DIAGNOSIS — E1151 Type 2 diabetes mellitus with diabetic peripheral angiopathy without gangrene: Secondary | ICD-10-CM | POA: Diagnosis present

## 2023-06-15 DIAGNOSIS — I2510 Atherosclerotic heart disease of native coronary artery without angina pectoris: Secondary | ICD-10-CM | POA: Diagnosis present

## 2023-06-15 HISTORY — DX: Pure hypercholesterolemia, unspecified: E78.00

## 2023-06-15 LAB — TROPONIN I (HIGH SENSITIVITY): Troponin I (High Sensitivity): 2609 ng/L (ref <18)

## 2023-06-15 LAB — CBC
HCT: 39.9 % (ref 39.0–52.0)
Hemoglobin: 12.8 g/dL — ABNORMAL LOW (ref 13.0–17.0)
MCH: 29.9 pg (ref 26.0–34.0)
MCHC: 32.1 g/dL (ref 30.0–36.0)
MCV: 93.2 fL (ref 80.0–100.0)
Platelets: 207 10*3/uL (ref 150–400)
RBC: 4.28 MIL/uL (ref 4.22–5.81)
RDW: 12.9 % (ref 11.5–15.5)
WBC: 7.5 10*3/uL (ref 4.0–10.5)
nRBC: 0 % (ref 0.0–0.2)

## 2023-06-15 LAB — BASIC METABOLIC PANEL WITH GFR
Anion gap: 8 (ref 5–15)
BUN: 24 mg/dL — ABNORMAL HIGH (ref 8–23)
CO2: 24 mmol/L (ref 22–32)
Calcium: 9.1 mg/dL (ref 8.9–10.3)
Chloride: 102 mmol/L (ref 98–111)
Creatinine, Ser: 0.81 mg/dL (ref 0.61–1.24)
GFR, Estimated: >60 mL/min (ref >60)
Glucose, Bld: 103 mg/dL — ABNORMAL HIGH (ref 70–99)
Potassium: 3.5 mmol/L (ref 3.5–5.1)
Sodium: 134 mmol/L — ABNORMAL LOW (ref 135–145)

## 2023-06-15 MED ORDER — ASPIRIN 81 MG PO CHEW
243.0000 mg | CHEWABLE_TABLET | Freq: Once | ORAL | Status: AC
  Administered 2023-06-16: 243 mg via ORAL
  Filled 2023-06-15: qty 3

## 2023-06-15 MED ORDER — ASPIRIN 81 MG PO CHEW: 162.0000 mg | CHEWABLE_TABLET | Freq: Once | ORAL | Status: DC

## 2023-06-15 MED ORDER — OXYCODONE HCL 5 MG PO TABS
5.0000 mg | ORAL_TABLET | Freq: Once | ORAL | Status: AC
  Administered 2023-06-15: 5 mg via ORAL
  Filled 2023-06-15: qty 1

## 2023-06-15 NOTE — ED Triage Notes (Signed)
 Pt reports tight centralized chest pain starting at 10am. Pain is non-radiating and intermittent. Pt has surgical scar down sternum from pacemaker placement. Pt denies any other symptoms. Denies fever, SOB, weakness, fatigue, dizziness. Pt awake and alert, A&Ox4 in triage.

## 2023-06-15 NOTE — ED Notes (Signed)
 Date and time results received: 06/15/23 10:19 PM  (use smartphrase ".now" to insert current time)  Test: Troponin Critical Value: 2609  Name of Provider Notified: Trellis Paganini  Orders Received? Or Actions Taken?:  MAR

## 2023-06-15 NOTE — ED Provider Notes (Incomplete)
 [place] EMERGENCY DEPARTMENT AT [facility] Provider Note   CSN: [id number] Arrival date & time: 06/15/23  2059     History {Add pertinent medical, surgical, social history, OB history to HPI:1} Chief Complaint  Patient presents with  . Chest Pain    [name] is a 63 y.o. 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  He describes pain as a "mild stabbing".  He endorses the pain 7/10 but does not feel similar to heart attack in the past.  Pain is nonexertional with no associated shortness of breath.  Reports he is has been taking medications as prescribed.  No coughing, congestion, fevers.  The history is provided by the patient. The history is limited by a language barrier. A language interpreter was used.  Chest Pain Associated symptoms: no abdominal pain, no cough, no dizziness, no fatigue, no fever, no headache, no nausea, no numbness, no palpitations, no shortness of breath, no vomiting and no weakness       Home Medications Prior to Admission medications   Medication Sig Start Date End Date Taking? Authorizing Provider  acetaminophen (TYLENOL) 325 MG tablet Take 2 tablets (650 mg total) by mouth every 6 (six) hours as needed for mild pain (pain score 1-3). 05/19/23   Stehler, Oren Bracket, PA-C  aspirin EC 81 MG tablet Take 162 mg by mouth daily. Swallow whole.    [provider]  atorvastatin (LIPITOR) 80 MG tablet Take 1 tablet (80 mg total) by mouth daily. 06/06/23   Sharlene Dory, PA-C  clopidogrel (PLAVIX) 75 MG tablet Take 1 tablet (75 mg total) by mouth daily. 06/14/23   Stehler, Oren Bracket, PA-C  gabapentin (NEURONTIN) 300 MG capsule Take 1 capsule (300 mg total) by mouth 3 (three) times daily. Patient taking differently: Take 300 mg by mouth 3  (three) times daily. Take 300 mg by mouth two to three times a day 05/05/23 08/03/23  Donell Beers, FNP  metFORMIN (GLUCOPHAGE) 1000 MG tablet Take 1 tablet (1,000 mg total) by mouth 2 (two) times daily with a meal. 06/14/23   Stehler, Oren Bracket, PA-C  metoprolol tartrate (LOPRESSOR) 25 MG tablet Take 0.5 tablets (12.5 mg total) by mouth 2 (two) times daily. 06/06/23   Sharlene Dory, PA-C  oxyCODONE (OXY IR/ROXICODONE) 5 MG immediate release tablet Take 1 tablet (5 mg total) by mouth every 6 (six) hours as needed for severe pain (pain score 7-10). 05/19/23   Stehler, Oren Bracket, PA-C  polyethylene glycol (MIRALAX) 17 g packet Take 17 g by mouth daily. Or as needed for constipation 05/19/23   Jenny Reichmann, PA-C      Allergies    Patient has no known allergies.    Review of Systems   Review of Systems  Constitutional:  Negative for chills, fatigue and fever.  Respiratory:  Negative for cough, chest tightness, shortness of breath and wheezing.   Cardiovascular:  Positive for chest pain. Negative for palpitations.  Gastrointestinal:  Negative for abdominal pain, constipation, diarrhea, nausea and vomiting.  Neurological:  Negative for dizziness, seizures, weakness, light-headedness, numbness and headaches.    Physical Exam Updated Vital Signs BP 128/75 (BP Location: Left Arm)   Pulse 78   Temp 97.9 F (36.6 C) (Oral)   Resp 17   Ht  5\' 3"  (1.6 m)   Wt 68.9 kg   SpO2 99%   BMI 26.93 kg/m  Physical Exam Vitals and nursing note reviewed.  Constitutional:      General: He is not in acute distress.    Appearance: Normal appearance.  HENT:     Head: Normocephalic and atraumatic.  Eyes:     Conjunctiva/sclera: Conjunctivae normal.  Cardiovascular:     Rate and Rhythm: Normal rate.     Heart sounds: No murmur heard. Pulmonary:     Effort: Pulmonary effort is normal. No respiratory distress.  Musculoskeletal:     Right lower leg: No edema.     Left lower leg: No edema.      Comments: No BLE swelling nor TTP  Skin:    General: Skin is warm.     Capillary Refill: Capillary refill takes less than 2 seconds.     Coloration: Skin is not jaundiced or pale.  Neurological:     Mental Status: He is alert and oriented to person, place, and time. Mental status is at baseline.     ED Results / Procedures / Treatments   Labs (all labs ordered are listed, but only abnormal results are displayed) Labs Reviewed  CBC - Abnormal; Notable for the following components:      Result Value   Hemoglobin 12.8 (*)    All other components within normal limits  BASIC METABOLIC PANEL WITH GFR  TROPONIN I (HIGH SENSITIVITY)    EKG None  Radiology DG Chest 2 View Result Date: 06/15/2023 CLINICAL DATA:  Status post coronary artery bypass graft. EXAM: CHEST - 2 VIEW COMPARISON:  May 17, 2023. FINDINGS: The heart size and mediastinal contours are within normal limits. Both lungs are clear. The visualized skeletal structures are unremarkable. IMPRESSION: No active cardiopulmonary disease. Electronically Signed   By: Lupita Raider M.D.   On: 06/15/2023 12:31    Procedures Procedures  {Document cardiac monitor, telemetry assessment procedure when appropriate:1}  Medications Ordered in ED Medications - No data to display  ED Course/ Medical Decision Making/ A&P   {   Click here for ABCD2, HEART and other calculatorsREFRESH Note before signing :1}                              Medical Decision Making Amount and/or Complexity of Data Reviewed Labs: ordered. Radiology: ordered.  Risk Prescription drug management.     Patient presents to the ED for concern of ***, this involves an extensive number of treatment options, and is a complaint that carries with it a high risk of complications and morbidity.  The differential diagnosis includes ***   Co morbidities that complicate the patient evaluation  ***   Additional history obtained:  Additional history  obtained from *** {Blank multiple:19196::"EMS","Family","Nursing","Outside Medical Records","Past Admission"}   External records from outside source obtained and reviewed including ***   Lab Tests:  I Ordered, and personally interpreted labs.  The pertinent results include:  ***   Imaging Studies ordered:  I ordered imaging studies including CXR  I independently visualized and interpreted imaging which showed no acute cardiopulmonary pathology I agree with the radiologist interpretation   Cardiac Monitoring:  The patient was maintained on a cardiac monitor.  I personally viewed and interpreted the cardiac monitored which showed an underlying rhythm of: ***   Medicines ordered and prescription drug management:  I ordered medication including ASA 243mg   for CP  Reevaluation of the patient after these medicines showed that the patient stayed the same I have reviewed the patients home medicines and have made adjustments as needed    Consultations Obtained:  I requested consultation with cardiology Dr. Hulan Saas at 2231,  and discussed lab, EKG, and imaging findings as well as pertinent plan - they recommend: to give remaining ASA 243mg  and hold off on heparin for second troponin to see if uptrending or flat. He will likely be admitted for elevated troponin He requested to be called back with troponin result   Problem List / ED Course:  CP Elevated troponin Reports pain does not feel like previous NSTEMI Provided remaining ASA dose as he takes one daily Discussed patient with cards with recommendation above   Reevaluation:  After the interventions noted above, I reevaluated the patient and found that they have :stayed the same     Dispostion:  After consideration of the diagnostic results and the patients response to treatment, I feel that the patent would benefit from ***.   Final Clinical Impression(s) / ED Diagnoses Final diagnoses:  None    Rx / DC Orders ED  Discharge Orders     None

## 2023-06-15 NOTE — ED Provider Notes (Signed)
 [place] EMERGENCY DEPARTMENT AT [facility] Provider Note   CSN: [id number] Arrival date & time: 06/15/23  2059     History  Chief Complaint  Patient presents with   Chest Pain    [name] is a 63 y.o. 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  He describes pain as a "mild stabbing".  He endorses the pain 7/10 but does not feel similar to heart attack in the past.  Pain is nonexertional with no associated shortness of breath.  Reports he is has been taking medications as prescribed.  No coughing, congestion, fevers.  He was evaluated by cardiology yesterday with no complaints of CP.  The history is provided by the patient. The history is limited by a language barrier. A language interpreter was used.  Chest Pain Associated symptoms: no abdominal pain, no cough, no dizziness, no fatigue, no fever, no headache, no nausea, no numbness, no palpitations, no shortness of breath, no vomiting and no weakness       Home Medications Prior to Admission medications   Medication Sig Start Date End Date Taking? Authorizing Provider  acetaminophen (TYLENOL) 325 MG tablet Take 2 tablets (650 mg total) by mouth every 6 (six) hours as needed for mild pain (pain score 1-3). 05/19/23   Stehler, Oren Bracket, PA-C  aspirin EC 81 MG tablet Take 162 mg by mouth daily. Swallow whole.    [provider]  atorvastatin (LIPITOR) 80 MG tablet Take 1 tablet (80 mg total) by mouth daily. 06/06/23   Sharlene Dory, PA-C  clopidogrel (PLAVIX) 75 MG tablet Take 1 tablet (75 mg total) by mouth daily. 06/14/23   Stehler, Oren Bracket, PA-C  gabapentin (NEURONTIN) 300 MG capsule Take 1 capsule (300 mg total) by mouth 3 (three) times daily. Patient taking differently: Take 300 mg by mouth 3 (three)  times daily. Take 300 mg by mouth two to three times a day 05/05/23 08/03/23  Donell Beers, FNP  metFORMIN (GLUCOPHAGE) 1000 MG tablet Take 1 tablet (1,000 mg total) by mouth 2 (two) times daily with a meal. 06/14/23   Stehler, Oren Bracket, PA-C  metoprolol tartrate (LOPRESSOR) 25 MG tablet Take 0.5 tablets (12.5 mg total) by mouth 2 (two) times daily. 06/06/23   Sharlene Dory, PA-C  oxyCODONE (OXY IR/ROXICODONE) 5 MG immediate release tablet Take 1 tablet (5 mg total) by mouth every 6 (six) hours as needed for severe pain (pain score 7-10). 05/19/23   Stehler, Oren Bracket, PA-C  polyethylene glycol (MIRALAX) 17 g packet Take 17 g by mouth daily. Or as needed for constipation 05/19/23   Jenny Reichmann, PA-C      Allergies    Patient has no known allergies.    Review of Systems   Review of Systems  Constitutional:  Negative for chills, fatigue and fever.  Respiratory:  Negative for cough, chest tightness, shortness of breath and wheezing.   Cardiovascular:  Positive for chest pain. Negative for palpitations.  Gastrointestinal:  Negative for abdominal pain, constipation, diarrhea, nausea and vomiting.  Neurological:  Negative for dizziness, seizures, weakness, light-headedness, numbness and headaches.    Physical Exam Updated Vital Signs BP 128/75 (BP Location: Left Arm)   Pulse 78   Temp 97.9 F (36.6 C) (Oral)   Resp 17  Ht 5\' 3"  (1.6 m)   Wt 68.9 kg   SpO2 99%   BMI 26.93 kg/m  Physical Exam Vitals and nursing note reviewed.  Constitutional:      General: He is not in acute distress.    Appearance: Normal appearance. He is not diaphoretic.  HENT:     Head: Normocephalic and atraumatic.  Eyes:     Conjunctiva/sclera: Conjunctivae normal.  Cardiovascular:     Rate and Rhythm: Normal rate.     Heart sounds: No murmur heard. Pulmonary:     Effort: Pulmonary effort is normal. No accessory muscle usage or respiratory distress.     Breath sounds: Normal breath sounds.      Comments: Speaking in full and complete sentences without difficulty Chest:     Comments: Well healing noninfectious appearing midsternal surgical scar from previous CABG.  Musculoskeletal:     Right lower leg: No edema.     Left lower leg: No edema.     Comments: No BLE swelling nor TTP  Skin:    General: Skin is warm.     Capillary Refill: Capillary refill takes less than 2 seconds.     Coloration: Skin is not jaundiced or pale.  Neurological:     Mental Status: He is alert and oriented to person, place, and time. Mental status is at baseline.     ED Results / Procedures / Treatments   Labs (all labs ordered are listed, but only abnormal results are displayed) Labs Reviewed  BASIC METABOLIC PANEL WITH GFR - Abnormal; Notable for the following components:      Result Value   Sodium 134 (*)    Glucose, Bld 103 (*)    BUN 24 (*)    All other components within normal limits  CBC - Abnormal; Notable for the following components:   Hemoglobin 12.8 (*)    All other components within normal limits  TROPONIN I (HIGH SENSITIVITY) - Abnormal; Notable for the following components:   Troponin I (High Sensitivity) 2,609 (*)    All other components within normal limits  TROPONIN I (HIGH SENSITIVITY)    EKG EKG Interpretation Date/Time:  Thursday June 15 2023 21:15:59 EDT Ventricular Rate:  79 PR Interval:  148 QRS Duration:  91 QT Interval:  441 QTC Calculation: 506 R Axis:   91  Text Interpretation: Sinus rhythm Anterior infarct, age indeterminate Lateral leads are also involved Prolonged QT interval No significant change since last tracing Confirmed by Jacalyn Lefevre [phone]) on 06/15/2023 9:46:01 PM  Radiology DG Chest 2 View Result Date: 06/15/2023 CLINICAL DATA:  Left upper chest pain. EXAM: CHEST - 2 VIEW COMPARISON:  06/15/2023. FINDINGS: The heart size and mediastinal contours are stable. Mild atelectasis or scarring is noted at the lung bases. No consolidation, effusion,  or pneumothorax. Sternotomy wires are noted. Degenerative changes are present in the thoracic spine. No acute osseous abnormality. IMPRESSION: No active cardiopulmonary disease. Electronically Signed   By: Thornell Sartorius M.D.   On: 06/15/2023 21:42   DG Chest 2 View Result Date: 06/15/2023 CLINICAL DATA:  Status post coronary artery bypass graft. EXAM: CHEST - 2 VIEW COMPARISON:  May 17, 2023. FINDINGS: The heart size and mediastinal contours are within normal limits. Both lungs are clear. The visualized skeletal structures are unremarkable. IMPRESSION: No active cardiopulmonary disease. Electronically Signed   By: Lupita Raider M.D.   On: 06/15/2023 12:31    Procedures Procedures    Medications Ordered in ED Medications  aspirin  chewable tablet 243 mg (has no administration in time range)  oxyCODONE (Oxy IR/ROXICODONE) immediate release tablet 5 mg (5 mg Oral Given 06/15/23 2238)    ED Course/ Medical Decision Making/ A&P                                 Medical Decision Making Amount and/or Complexity of Data Reviewed Labs: ordered. Radiology: ordered.  Risk OTC drugs. Prescription drug management.   Patient presents to the ED for concern of CP, this involves an extensive number of treatment options, and is a complaint that carries with it a high risk of complications and morbidity.  The differential diagnosis includes ACS, dresslers, PNA, pulmonary edema, MSK   Co morbidities that complicate the patient evaluation  Recent CABG and NSTEMI   Additional history obtained:  Additional history obtained from Nursing, Outside Medical Records, and Past Admission   External records from outside source obtained and reviewed including  Triage RN note    Lab Tests:  I Ordered, and personally interpreted labs.  The pertinent results include:   Troponin 2609   Imaging Studies ordered:  I ordered imaging studies including CXR  I independently visualized and interpreted  imaging which showed no acute cardiopulmonary pathology I agree with the radiologist interpretation   Cardiac Monitoring:  The patient was maintained on a cardiac monitor.  I personally viewed and interpreted the cardiac monitored which showed an underlying rhythm of: NSR. Inverted T waves noted in leads 1, V2, V3, V4 per previous EKG. No STE nor new changes noted   Medicines ordered and prescription drug management:  I ordered medication including ASA 243mg   for CP  Reevaluation of the patient after these medicines showed that the patient stayed the same I have reviewed the patients home medicines and have made adjustments as needed    Consultations Obtained:  I requested consultation with cardiology Dr. Hulan Saas at 2231,  and discussed lab, EKG, and imaging findings as well as pertinent plan - they recommend: to give remaining ASA 243mg  and hold off on heparin for second troponin to see if uptrending or flat. He will likely be admitted for elevated troponin He requested to be called back with troponin result   Problem List / ED Course:  CP Elevated troponin Reports pain does not feel like previous NSTEMI Provided remaining ASA dose as he takes one daily Discussed patient with cards with recommendation above   Reevaluation:  After the interventions noted above, I reevaluated the patient and found that they have :stayed the same   Dispostion:  Sign out to [employer] PA-C pending delta troponin. This will determine treatment plan. However, patient will likely be admitted for further cards workup   Final Clinical Impression(s) / ED Diagnoses Final diagnoses:  Chest pain, unspecified type  Elevated troponin    Rx / DC Orders ED Discharge Orders     None         Judithann Sheen, PA 06/16/23 Dinah Beers, MD 06/19/23 [phone]

## 2023-06-15 NOTE — ED Notes (Addendum)
 Pt was ambulatory to the restroom, advised pt can not have anything until scan come back

## 2023-06-16 ENCOUNTER — Encounter (HOSPITAL_COMMUNITY)
Admission: EM | Disposition: A | Discharge status: Home / Self Care | Payer: Self-pay | Source: Home / Self Care | Attending: Cardiovascular Disease

## 2023-06-16 ENCOUNTER — Inpatient Hospital Stay (HOSPITAL_COMMUNITY): Payer: Self-pay

## 2023-06-16 DIAGNOSIS — Z79899 Other long term (current) drug therapy: Secondary | ICD-10-CM | POA: Diagnosis not present

## 2023-06-16 DIAGNOSIS — I214 Non-ST elevation (NSTEMI) myocardial infarction: Secondary | ICD-10-CM | POA: Diagnosis not present

## 2023-06-16 DIAGNOSIS — I252 Old myocardial infarction: Secondary | ICD-10-CM | POA: Diagnosis not present

## 2023-06-16 DIAGNOSIS — R0789 Other chest pain: Secondary | ICD-10-CM | POA: Diagnosis present

## 2023-06-16 DIAGNOSIS — I25810 Atherosclerosis of coronary artery bypass graft(s) without angina pectoris: Secondary | ICD-10-CM | POA: Diagnosis present

## 2023-06-16 DIAGNOSIS — E785 Hyperlipidemia, unspecified: Secondary | ICD-10-CM

## 2023-06-16 DIAGNOSIS — Z7984 Long term (current) use of oral hypoglycemic drugs: Secondary | ICD-10-CM | POA: Diagnosis not present

## 2023-06-16 DIAGNOSIS — I700 Atherosclerosis of aorta: Secondary | ICD-10-CM | POA: Diagnosis present

## 2023-06-16 DIAGNOSIS — I2510 Atherosclerotic heart disease of native coronary artery without angina pectoris: Secondary | ICD-10-CM

## 2023-06-16 DIAGNOSIS — I10 Essential (primary) hypertension: Secondary | ICD-10-CM | POA: Diagnosis present

## 2023-06-16 DIAGNOSIS — E1151 Type 2 diabetes mellitus with diabetic peripheral angiopathy without gangrene: Secondary | ICD-10-CM | POA: Diagnosis present

## 2023-06-16 DIAGNOSIS — Z603 Acculturation difficulty: Secondary | ICD-10-CM | POA: Diagnosis present

## 2023-06-16 DIAGNOSIS — E7800 Pure hypercholesterolemia, unspecified: Secondary | ICD-10-CM | POA: Diagnosis present

## 2023-06-16 DIAGNOSIS — F17210 Nicotine dependence, cigarettes, uncomplicated: Secondary | ICD-10-CM | POA: Diagnosis present

## 2023-06-16 DIAGNOSIS — Z7902 Long term (current) use of antithrombotics/antiplatelets: Secondary | ICD-10-CM | POA: Diagnosis not present

## 2023-06-16 DIAGNOSIS — Z833 Family history of diabetes mellitus: Secondary | ICD-10-CM | POA: Diagnosis not present

## 2023-06-16 DIAGNOSIS — Z7982 Long term (current) use of aspirin: Secondary | ICD-10-CM | POA: Diagnosis not present

## 2023-06-16 HISTORY — PX: LEFT HEART CATH AND CORS/GRAFTS ANGIOGRAPHY: CATH118250

## 2023-06-16 LAB — CBC
HCT: 39.3 % (ref 39.0–52.0)
Hemoglobin: 13 g/dL (ref 13.0–17.0)
MCH: 30.3 pg (ref 26.0–34.0)
MCHC: 33.1 g/dL (ref 30.0–36.0)
MCV: 91.6 fL (ref 80.0–100.0)
Platelets: 202 10*3/uL (ref 150–400)
RBC: 4.29 MIL/uL (ref 4.22–5.81)
RDW: 12.8 % (ref 11.5–15.5)
WBC: 6.9 10*3/uL (ref 4.0–10.5)
nRBC: 0 % (ref 0.0–0.2)

## 2023-06-16 LAB — ECHOCARDIOGRAM COMPLETE
Area-P 1/2: 4.89 cm2
Height: 63 in
S' Lateral: 3.4 cm
Single Plane A4C EF: 42.7 %
Weight: 2432 [oz_av]

## 2023-06-16 LAB — HEPARIN LEVEL (UNFRACTIONATED): Heparin Unfractionated: 0.24 [IU]/mL — ABNORMAL LOW (ref 0.30–0.70)

## 2023-06-16 LAB — PROTIME-INR
INR: 1 (ref 0.8–1.2)
Prothrombin Time: 13.2 s (ref 11.4–15.2)

## 2023-06-16 LAB — TROPONIN I (HIGH SENSITIVITY): Troponin I (High Sensitivity): 3828 ng/L (ref <18)

## 2023-06-16 SURGERY — LEFT HEART CATH AND CORS/GRAFTS ANGIOGRAPHY
Anesthesia: LOCAL

## 2023-06-16 MED ORDER — SODIUM CHLORIDE 0.9 % WEIGHT BASED INFUSION
1.0000 mL/kg/h | INTRAVENOUS | Status: DC
  Administered 2023-06-16: 1 mL/kg/h via INTRAVENOUS

## 2023-06-16 MED ORDER — HEPARIN SODIUM (PORCINE) 1000 UNIT/ML IJ SOLN
INTRAMUSCULAR | Status: AC
  Filled 2023-06-16: qty 10

## 2023-06-16 MED ORDER — PNEUMOCOCCAL 20-VAL CONJ VACC 0.5 ML IM SUSY
0.5000 mL | PREFILLED_SYRINGE | INTRAMUSCULAR | Status: DC
  Filled 2023-06-16: qty 0.5

## 2023-06-16 MED ORDER — IOHEXOL 350 MG/ML SOLN
INTRAVENOUS | Status: DC | PRN
  Administered 2023-06-16: 55 mL

## 2023-06-16 MED ORDER — HEPARIN SODIUM (PORCINE) 1000 UNIT/ML IJ SOLN
INTRAMUSCULAR | Status: DC | PRN
  Administered 2023-06-16: 4000 [IU] via INTRAVENOUS

## 2023-06-16 MED ORDER — SODIUM CHLORIDE 0.9 % IV SOLN: 250.0000 mL | INTRAVENOUS | Status: DC | PRN

## 2023-06-16 MED ORDER — HEPARIN SODIUM (PORCINE) 5000 UNIT/ML IJ SOLN
4000.0000 [IU] | Freq: Once | INTRAMUSCULAR | Status: AC
  Administered 2023-06-16: 4000 [IU] via INTRAVENOUS
  Filled 2023-06-16: qty 1

## 2023-06-16 MED ORDER — FENTANYL CITRATE (PF) 100 MCG/2ML IJ SOLN
INTRAMUSCULAR | Status: AC
  Filled 2023-06-16: qty 2

## 2023-06-16 MED ORDER — VERAPAMIL HCL 2.5 MG/ML IV SOLN
INTRAVENOUS | Status: AC
  Filled 2023-06-16: qty 2

## 2023-06-16 MED ORDER — ATORVASTATIN CALCIUM 80 MG PO TABS
80.0000 mg | ORAL_TABLET | Freq: Every day | ORAL | Status: DC
  Administered 2023-06-16 – 2023-06-18 (×3): 80 mg via ORAL
  Filled 2023-06-16 (×3): qty 1

## 2023-06-16 MED ORDER — LIDOCAINE HCL (PF) 1 % IJ SOLN
INTRAMUSCULAR | Status: AC
  Filled 2023-06-16: qty 30

## 2023-06-16 MED ORDER — VERAPAMIL HCL 2.5 MG/ML IV SOLN
INTRAVENOUS | Status: DC | PRN
  Administered 2023-06-16: 5 mL via INTRA_ARTERIAL

## 2023-06-16 MED ORDER — SODIUM CHLORIDE 0.9 % WEIGHT BASED INFUSION
3.0000 mL/kg/h | INTRAVENOUS | Status: DC
  Administered 2023-06-16: 3 mL/kg/h via INTRAVENOUS

## 2023-06-16 MED ORDER — HEPARIN SODIUM (PORCINE) 5000 UNIT/ML IJ SOLN: 60.0000 [IU]/kg | Freq: Once | INTRAMUSCULAR | Status: DC

## 2023-06-16 MED ORDER — INFLUENZA VAC A&B SURF ANT ADJ 0.5 ML IM SUSY
0.5000 mL | PREFILLED_SYRINGE | INTRAMUSCULAR | Status: DC
  Filled 2023-06-16: qty 0.5

## 2023-06-16 MED ORDER — MELATONIN 3 MG PO TABS: 3.0000 mg | ORAL_TABLET | Freq: Every evening | ORAL | Status: DC | PRN

## 2023-06-16 MED ORDER — NITROGLYCERIN 0.4 MG SL SUBL: 0.4000 mg | SUBLINGUAL_TABLET | SUBLINGUAL | Status: DC | PRN

## 2023-06-16 MED ORDER — ASPIRIN 81 MG PO TBEC
81.0000 mg | DELAYED_RELEASE_TABLET | Freq: Every day | ORAL | Status: DC
  Administered 2023-06-17 – 2023-06-18 (×2): 81 mg via ORAL
  Filled 2023-06-16 (×2): qty 1

## 2023-06-16 MED ORDER — GABAPENTIN 300 MG PO CAPS
300.0000 mg | ORAL_CAPSULE | Freq: Three times a day (TID) | ORAL | Status: DC
  Administered 2023-06-16 – 2023-06-18 (×7): 300 mg via ORAL
  Filled 2023-06-16 (×7): qty 1

## 2023-06-16 MED ORDER — POLYETHYLENE GLYCOL 3350 17 G PO PACK
17.0000 g | PACK | Freq: Every day | ORAL | Status: DC
  Administered 2023-06-16 – 2023-06-18 (×3): 17 g via ORAL
  Filled 2023-06-16 (×3): qty 1

## 2023-06-16 MED ORDER — METOPROLOL SUCCINATE ER 25 MG PO TB24
12.5000 mg | ORAL_TABLET | Freq: Two times a day (BID) | ORAL | Status: DC
  Administered 2023-06-16 – 2023-06-18 (×5): 12.5 mg via ORAL
  Filled 2023-06-16 (×5): qty 1

## 2023-06-16 MED ORDER — HEPARIN (PORCINE) IN NACL 2000-0.9 UNIT/L-% IV SOLN
INTRAVENOUS | Status: DC | PRN
  Administered 2023-06-16: 1000 mL

## 2023-06-16 MED ORDER — FENTANYL CITRATE (PF) 100 MCG/2ML IJ SOLN
INTRAMUSCULAR | Status: DC | PRN
  Administered 2023-06-16: 25 ug via INTRAVENOUS

## 2023-06-16 MED ORDER — SODIUM CHLORIDE 0.9% FLUSH: 3.0000 mL | INTRAVENOUS | Status: DC | PRN

## 2023-06-16 MED ORDER — ACETAMINOPHEN 325 MG PO TABS
650.0000 mg | ORAL_TABLET | Freq: Four times a day (QID) | ORAL | Status: DC | PRN
  Administered 2023-06-16 – 2023-06-17 (×3): 650 mg via ORAL
  Filled 2023-06-16 (×3): qty 2

## 2023-06-16 MED ORDER — MAGNESIUM HYDROXIDE 400 MG/5ML PO SUSP
30.0000 mL | Freq: Every day | ORAL | Status: DC | PRN
  Administered 2023-06-16: 30 mL via ORAL
  Filled 2023-06-16: qty 30

## 2023-06-16 MED ORDER — HEPARIN (PORCINE) 25000 UT/250ML-% IV SOLN
950.0000 [IU]/h | INTRAVENOUS | Status: DC
  Administered 2023-06-16: 850 [IU]/h via INTRAVENOUS
  Filled 2023-06-16: qty 250

## 2023-06-16 MED ORDER — LIDOCAINE HCL (PF) 1 % IJ SOLN
INTRAMUSCULAR | Status: DC | PRN
  Administered 2023-06-16: 2 mL
  Administered 2023-06-16: 12 mL

## 2023-06-16 MED ORDER — MIDAZOLAM HCL 2 MG/2ML IJ SOLN
INTRAMUSCULAR | Status: AC
  Filled 2023-06-16: qty 2

## 2023-06-16 MED ORDER — CLOPIDOGREL BISULFATE 75 MG PO TABS
75.0000 mg | ORAL_TABLET | Freq: Every day | ORAL | Status: DC
  Administered 2023-06-16 – 2023-06-18 (×3): 75 mg via ORAL
  Filled 2023-06-16 (×3): qty 1

## 2023-06-16 MED ORDER — MIDAZOLAM HCL 2 MG/2ML IJ SOLN
INTRAMUSCULAR | Status: DC | PRN
  Administered 2023-06-16: 2 mg via INTRAVENOUS

## 2023-06-16 SURGICAL SUPPLY — 13 items
CATH INFINITI 5FR MULTPACK ANG (CATHETERS) IMPLANT
CLOSURE MYNX CONTROL 5F (Vascular Products) IMPLANT
DEVICE RAD COMP TR BAND LRG (VASCULAR PRODUCTS) IMPLANT
ELECT DEFIB PAD ADLT CADENCE (PAD) IMPLANT
GLIDESHEATH SLEND A-KIT 6F 22G (SHEATH) IMPLANT
GUIDEWIRE ANGLED .035X150CM (WIRE) IMPLANT
GUIDEWIRE INQWIRE 1.5J.035X260 (WIRE) IMPLANT
INQWIRE 1.5J .035X260CM (WIRE) ×1 IMPLANT
PACK CARDIAC CATHETERIZATION (CUSTOM PROCEDURE TRAY) ×1 IMPLANT
SET ATX-X65L (MISCELLANEOUS) IMPLANT
SHEATH PINNACLE 5F 10CM (SHEATH) IMPLANT
SHEATH PROBE COVER 6X72 (BAG) IMPLANT
WIRE MICRO SET SILHO 5FR 7 (SHEATH) IMPLANT

## 2023-06-16 NOTE — ED Notes (Signed)
 Patient arrived to facility from [facility] via [facility], Patient alert and orientedx4, denies any pain at this time. Patient is ambulatory. Patient currently on Heparin drip at 851ml/hr.

## 2023-06-16 NOTE — Progress Notes (Signed)
  Echocardiogram 2D Echocardiogram has been performed.  Delcie Roch 06/16/2023, 11:35 AM

## 2023-06-16 NOTE — Progress Notes (Signed)
 Cardiology follow-up note:  Please see the fellow admission H&P from earlier this morning.  The patient is independently interviewed, examined, and evaluated.  He presents with recurrent non-STEMI after recent CABG 1 month ago.  He is currently chest pain-free.  His exam is unremarkable and his left radial pulse is 2+.  In February he presented with non-STEMI and was found to have severe multivessel disease.  He underwent four-vessel CABG with a LIMA to LAD, saphenous vein graft to first diagonal, and sequential graft to the ramus intermedius and second OM branches.  He has done well until yesterday when he developed substernal chest pain and ultimately ruled in for non-STEMI with a high-sensitivity troponin peak of 3828.  Cardiac catheterization and possible PCI are indicated.  I suspect he has had early graft failure of one of his saphenous vein grafts. I have reviewed the risks, indications, and alternatives to cardiac catheterization, possible angioplasty, and stenting with the patient. Risks include but are not limited to bleeding, infection, vascular injury, stroke, myocardial infection, arrhythmia, kidney injury, radiation-related injury in the case of prolonged fluoroscopy use, emergency cardiac surgery, and death. The patient understands the risks of serious complication is 1-2 in 1000 with diagnostic cardiac cath and 1-2% or less with angioplasty/stenting.  He understands to remain n.p.o. until the procedure.  Further plans/disposition pending his cardiac catheterization result.  He will remain on IV heparin, as well as DAPT with aspirin and clopidogrel.  Tonny Bollman 06/16/2023 9:29 AM

## 2023-06-16 NOTE — ED Provider Notes (Signed)
  Physical Exam  BP 122/77   Pulse 72   Temp 97.8 F (36.6 C) (Oral)   Resp 12   Ht 5\' 3"  (1.6 m)   Wt 68.9 kg   SpO2 98%   BMI 26.93 kg/m   Physical Exam  Procedures  .Critical Care  Performed by: Paris Lore, PA-C Authorized by: Paris Lore, PA-C   Critical care provider statement:    Critical care time (minutes):  45   Critical care was time spent personally by me on the following activities:  Development of treatment plan with patient or surrogate, discussions with consultants, evaluation of patient's response to treatment, examination of patient, obtaining history from patient or surrogate, ordering and performing treatments and interventions, ordering and review of laboratory studies, ordering and review of radiographic studies, pulse oximetry and re-evaluation of patient's condition   ED Course / MDM   Clinical Course as of 06/16/23 0133  Fri Jun 16, 2023  0048 Consult to Dr. Hulan Saas, cardiology fellow to discuss uptrending troponin in context of recent CABG. Will proceed with heparin bolus and infusion, per cardiologist, and will plan for admission to cardiology at [facility] for likely urgent heart catheterization. I appreciate his collaboration in the care of this patient.  [RS]  0054 Repeat EKG without progression to STEMI, will not activate cath lab.  [RS]  0059 ED-ED transfer accepting EDP Dr. Clayborne Dana.  [RS]    Clinical Course User Index [RS] Adrain Butrick, Eugene Gavia, PA-C   Medical Decision Making Amount and/or Complexity of Data Reviewed Labs: ordered. Radiology: ordered.  Risk OTC drugs. Prescription drug management.    Care of this patient assumed from preceding ED provider Edyth Gunnels, PA-C at time of shift change.  Please see nurses note for further insight and the patient did course.  In brief patient is a 63 year old male with history of NSTEMI in February of this year status post CABG on 2/24 who presents the emergency department today for  persistent chest pain x 12 hours described as stabbing pain 7 out of 10.  Concern intake for troponin of 2609.  Consult to cardiology fellow Dr. Hulan Saas by preceding ED provider.  Plan is for repeat troponin. If uptrending will have plan for heparinization and admission to cardiology at [facility].  If flat will consider admission to [facility] for the time being.  I reevaluated the patient at the bedside and he states that his chest pain is completely resolved at this time, he is resting comfortably, interview conducted with the assistance of Spanish interpreter Christiane Ha [phone].  Unfortunately troponin is uptrending to 3828.  Patient remains chest pain-free and repeat EKG at this time is nonischemic, reviewed while on the phone with cardiologist.  Plan will be for ED to ED transfer to [facility] for an [facility] cardiology consultation, admission to cardiology service, and plan for likely catheterization later in the day.  Patient remains hemodynamically stable at this time.  Shandell  voiced understanding of his medical evaluation and treatment plan. Each of their questions answered to their expressed satisfaction.  He is amenable to plan for admission.  This chart was dictated using voice recognition software, Dragon. Despite the best efforts of this provider to proofread and correct errors, errors may still occur which can change documentation meaning.   Patient collected by [facility] for transportation to [facility].      Paris Lore, PA-C 06/16/23 0157    Gilda Crease, MD 06/16/23 [phone]

## 2023-06-16 NOTE — H&P (Signed)
 Cardiology Admission History and Physical:   Patient ID: [name] MRN: [id number]; DOB: 02-23-1961   Admission date: 06/15/2023  Primary Care Provider: Donell Beers, FNP [facility] Cardiologist: None  [facility] Electrophysiologist:  None   Chief Complaint:  chest pain  Patient Profile:   [name] is a 63 y.o. male with HTN, T2DM, HLD, NSTEMI (05/10/2023), CABG x 4 (05/15/2023).  History of Present Illness:   [name] on 3/27 at approximately 10 AM while at an appointment for an xray, he developed constant chest pain in the middle of his chest.  He rated the pain at a 7/10.  He had associated numbness in his left fingers. No associated nausea, vomiting, or shortness of breath. Pain was not associated with exertion.  The pain felt diffierent than his NSTEMI one month ago. This pain continued until 7pm, at which point he decided to go to the ED.  In the ED found to have rising troponins.His chest pain stopped while he was in the ED. He was transferred to [facility] for further management. He is not currently feeling any chest pain during my examination, but has some residual numbness in his left chest and left fingers. He was overall feeling very good since surgery until yesterday, without recent sickness/illness.  He reports he was taking all his medications, and his 30 day prescriptions ended this Wednesday, but he will not miss any doses as he got refills.  He has been taking the oxycodone intermittently and only has 3 tablets letf; recently taking only every few days.  Denies smoking, alcohol, or drugs.   Past Medical History:  Diagnosis Date   Arthritis of left knee    Cervical stenosis of spine    Diabetes mellitus without complication (HCC)    High cholesterol    Hyperlipidemia LDL goal <70    Primary hypertension 02/07/2022   Tobacco use     Past Surgical History:  Procedure Laterality Date   CORONARY ARTERY BYPASS GRAFT N/A 05/15/2023   Procedure:  CORONARY ARTERY BYPASS GRAFTING (CABG) TIMES FOUR USING LEFT INTERNAL MAMMARY ARTERY AND ENDOSCOPICALLY HARVESTED RIGHT GREATER SAPHENOUS VEIN;  Surgeon: [name], MD;  Location: [facility] OR;  Service: Open Heart Surgery;  Laterality: N/A;   LEFT HEART CATH AND CORONARY ANGIOGRAPHY N/A 05/11/2023   Procedure: LEFT HEART CATH AND CORONARY ANGIOGRAPHY;  Surgeon: Tonny Bollman, MD;  Location: [facility] INVASIVE CV LAB;  Service: Cardiovascular;  Laterality: N/A;   TEE WITHOUT CARDIOVERSION N/A 05/15/2023   Procedure: TRANSESOPHAGEAL ECHOCARDIOGRAM (TEE);  Surgeon: [name], MD;  Location: [facility] OR;  Service: Open Heart Surgery;  Laterality: N/A;     Medications Prior to Admission: Prior to Admission medications   Medication Sig Start Date End Date Taking? Authorizing Provider  acetaminophen (TYLENOL) 325 MG tablet Take 2 tablets (650 mg total) by mouth every 6 (six) hours as needed for mild pain (pain score 1-3). 05/19/23   Stehler, Oren Bracket, PA-C  aspirin EC 81 MG tablet Take 162 mg by mouth daily. Swallow whole.    [provider]  atorvastatin (LIPITOR) 80 MG tablet Take 1 tablet (80 mg total) by mouth daily. 06/06/23   Sharlene Dory, PA-C  clopidogrel (PLAVIX) 75 MG tablet Take 1 tablet (75 mg total) by mouth daily. 06/14/23   Stehler, Oren Bracket, PA-C  gabapentin (NEURONTIN) 300 MG capsule Take 1 capsule (300 mg total) by mouth 3 (three) times daily. Patient taking differently: Take 300 mg by mouth 3 (three)  times daily. Take 300 mg by mouth two to three times a day 05/05/23 08/03/23  Donell Beers, FNP  metFORMIN (GLUCOPHAGE) 1000 MG tablet Take 1 tablet (1,000 mg total) by mouth 2 (two) times daily with a meal. 06/14/23   Stehler, Oren Bracket, PA-C  metoprolol tartrate (LOPRESSOR) 25 MG tablet Take 0.5 tablets (12.5 mg total) by mouth 2 (two) times daily. 06/06/23   Sharlene Dory, PA-C  oxyCODONE (OXY IR/ROXICODONE) 5 MG immediate release tablet Take 1 tablet (5 mg total) by  mouth every 6 (six) hours as needed for severe pain (pain score 7-10). 05/19/23   Stehler, Oren Bracket, PA-C  polyethylene glycol (MIRALAX) 17 g packet Take 17 g by mouth daily. Or as needed for constipation 05/19/23   Stehler, Oren Bracket, PA-C     Allergies:   No Known Allergies  Social History:   Social History   Socioeconomic History   Marital status: Single    Spouse name: Not on file   Number of children: 9   Years of education: Not on file   Highest education level: Not on file  Occupational History   Not on file  Tobacco Use   Smoking status: Every Day    Current packs/day: 0.25    Types: Cigarettes   Smokeless tobacco: Current  Substance and Sexual Activity   Alcohol use: Yes    Alcohol/week: 2.0 standard drinks of alcohol    Types: 2 Cans of beer per week    Comment: occas   Drug use: Not Currently    Comment: ued to smoke weed   Sexual activity: Not on file  Other Topics Concern   Not on file  Social History Narrative   Lives alone with a room mate .    Social Drivers of Corporate investment banker Strain: Not on file  Food Insecurity: No Food Insecurity (05/11/2023)   Hunger Vital Sign    Worried About Running Out of Food in the Last Year: Never true    Ran Out of Food in the Last Year: Never true  Transportation Needs: No Transportation Needs (05/11/2023)   PRAPARE - Administrator, Civil Service (Medical): No    Lack of Transportation (Non-Medical): No  Physical Activity: Not on file  Stress: Not on file  Social Connections: Not on file  Intimate Partner Violence: Patient Declined (05/14/2023)   Humiliation, Afraid, Rape, and Kick questionnaire    Fear of Current or Ex-Partner: Patient declined    Emotionally Abused: Patient declined    Physically Abused: Patient declined    Sexually Abused: Patient declined    Family History:   The patient's family history includes Diabetes in his father. There is no history of Colon cancer or Prostate cancer.     ROS:   Review of Systems: [y] = yes, [ ]  = no      General: Weight gain [ ] ; Weight loss [ ] ; Anorexia [ ] ; Fatigue [ ] ; Fever [ ] ; Chills [ ] ; Weakness [ ]    Cardiac: Chest pain/pressure [ y]; Resting SOB [ ] ; Exertional SOB [ ] ; Orthopnea [ ] ; Pedal Edema [ ] ; Palpitations [ ] ; Syncope [ ] ; Presyncope [ ] ; Paroxysmal nocturnal dyspnea [ ]    Pulmonary: Cough [ ] ; Wheezing [ ] ; Hemoptysis [ ] ; Sputum [ ] ; Snoring [ ]    GI: Vomiting [ ] ; Dysphagia [ ] ; Melena [ ] ; Hematochezia [ ] ; Heartburn [ ] ; Abdominal pain [ ] ; Constipation [ ] ; Diarrhea [ ] ;  BRBPR [ ]    GU: Hematuria [ ] ; Dysuria [ ] ; Nocturia [ ]  Vascular: Pain in legs with walking [ ] ; Pain in feet with lying flat [ ] ; Non-healing sores [ ] ; Stroke [ ] ; TIA [ ] ; Slurred speech [ ] ;   Neuro: Headaches [ ] ; Vertigo [ ] ; Seizures [ ] ; Paresthesias [ ] ;Blurred vision [ ] ; Diplopia [ ] ; Vision changes [ ]    Ortho/Skin: Arthritis [ ] ; Joint pain [ ] ; Muscle pain [ ] ; Joint swelling [ ] ; Back Pain [ ] ; Rash [ ]    Psych: Depression [ ] ; Anxiety [ ]    Heme: Bleeding problems [ ] ; Clotting disorders [ ] ; Anemia [ ]    Endocrine: Diabetes [ ] ; Thyroid dysfunction [ ]    Physical Exam/Data:   Vitals:   06/15/23 2115 06/16/23 0015 06/16/23 0101 06/16/23 0211  BP: 128/75 122/77  127/74  Pulse: 78 72  79  Resp: 17 12  16   Temp: 97.9 F (36.6 C)  97.8 F (36.6 C) (!) 97.4 F (36.3 C)  TempSrc: Oral  Oral Oral  SpO2: 99% 98%  100%  Weight:      Height:       No intake or output data in the 24 hours ending 06/16/23 0501    06/15/2023    9:12 PM 06/14/2023    3:24 PM 06/06/2023   11:15 AM  Last 3 Weights  Weight (lbs) 152 lb 150 lb 147 lb 3.2 oz  Weight (kg) 68.947 kg 68.04 kg 66.769 kg     Body mass index is 26.93 kg/m.  General:  Well nourished, well developed, in no acute distress HEENT: normal Lymph: no adenopathy Neck: no JVD Endocrine:  No thryomegaly Vascular: No carotid bruits; FA pulses 2+ bilaterally without bruits   Cardiac:  normal S1, S2; RRR; no murmur  Lungs:  clear to auscultation bilaterally, no wheezing, rhonchi or rales  Abd: soft, nontender, no hepatomegaly  Ext: no edema Musculoskeletal:  No deformities, BUE and BLE strength normal and equal Skin: warm and dry  Neuro:  CNs 2-12 intact, no focal abnormalities noted Psych:  Normal affect   EKG:  The ECG that was done and was personally reviewed and demonstrates NSR, prior anterior MI (old on review of prior ECG), no acute ST changes  Relevant CV Studies:  Echo (05/12/23): IMPRESSIONS   1. Left ventricular ejection fraction, by estimation, is 45 to 50%. The  left ventricle has mildly decreased function. The left ventricle  demonstrates regional wall motion abnormalities (see scoring  diagram/findings for description). There is mild  concentric left ventricular hypertrophy. Left ventricular diastolic  parameters are consistent with Grade I diastolic dysfunction (impaired  relaxation).   2. Prominent trabeculation at LV apex. Definity contrast demonstrates  slow flow and loosely organized mural thrombus at anteroapex as well.   3. Right ventricular systolic function is normal. The right ventricular  size is normal. Tricuspid regurgitation signal is inadequate for assessing  PA pressure.   4. The mitral valve is grossly normal. Trivial mitral valve  regurgitation.   5. The aortic valve is tricuspid. Aortic valve regurgitation is not  visualized. No aortic stenosis is present.   6. The inferior vena cava is normal in size with <50% respiratory  variability, suggesting right atrial pressure of 8 mmHg.   Laboratory Data:  High Sensitivity Troponin:   Recent Labs  Lab 06/15/23 2128 06/16/23 0001  TROPONINIHS 2,609* 3,828*      Chemistry Recent Labs  Lab 06/15/23  2128  NA 134*  K 3.5  CL 102  CO2 24  GLUCOSE 103*  BUN 24*  CREATININE 0.81  CALCIUM 9.1  GFRNONAA >60  ANIONGAP 8    No results for input(s): "PROT",  "ALBUMIN", "AST", "ALT", "ALKPHOS", "BILITOT" in the last 168 hours. Hematology Recent Labs  Lab 06/15/23 2128  WBC 7.5  RBC 4.28  HGB 12.8*  HCT 39.9  MCV 93.2  MCH 29.9  MCHC 32.1  RDW 12.9  PLT 207   BNPNo results for input(s): "BNP", "PROBNP" in the last 168 hours.  DDimer No results for input(s): "DDIMER" in the last 168 hours.  Radiology/Studies:  DG Chest 2 View Result Date: 06/15/2023 CLINICAL DATA:  Left upper chest pain. EXAM: CHEST - 2 VIEW COMPARISON:  06/15/2023. FINDINGS: The heart size and mediastinal contours are stable. Mild atelectasis or scarring is noted at the lung bases. No consolidation, effusion, or pneumothorax. Sternotomy wires are noted. Degenerative changes are present in the thoracic spine. No acute osseous abnormality. IMPRESSION: No active cardiopulmonary disease. Electronically Signed   By: Thornell Sartorius M.D.   On: 06/15/2023 21:42   DG Chest 2 View Result Date: 06/15/2023 CLINICAL DATA:  Status post coronary artery bypass graft. EXAM: CHEST - 2 VIEW COMPARISON:  May 17, 2023. FINDINGS: The heart size and mediastinal contours are within normal limits. Both lungs are clear. The visualized skeletal structures are unremarkable. IMPRESSION: No active cardiopulmonary disease. Electronically Signed   By: Lupita Raider M.D.   On: 06/15/2023 12:31      TIMI Risk Score for Unstable Angina or Non-ST Elevation MI:   The patient's TIMI risk score is 5, which indicates a 26% risk of all cause mortality, new or recurrent myocardial infarction or need for urgent revascularization in the next 14 days.    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 Troponins elevated and rising 2609 ->3828. Of note he had a recent NSTEMI last month, for which he was found to have multivessel CAD and  received a CABG just over a month ago. He has been compliant with all his medications, no missed doses. I am most concerned for a graft failure as etiology of this event, but he will require heart catheterization to ensure no new type 1 event with plaque rupture. -Coronary and graft angiography this am, npo -TTE to evaluate for wall motion abnormalities -Continue home asa/plavix/atorvastatin/metoprolol tartrate -s/p asa load, heparin bolus, now on heaprin gtt -telemetry -nitro sl tabs prn chest pain q5 minute  HLD Aortic Atherosclerosis PAD -continue home atorvastatin/asa/plavix  DM2 A1c 6.6.  -holding home metformin -continue home gabapentin -no SSI at this time as mild DM2  HTN -continue home metoprolol   Severity of Illness: The appropriate patient status for this patient is INPATIENT. Inpatient status is judged to be reasonable and necessary in order to provide the required intensity of service to ensure the patient's safety. The patient's presenting symptoms, physical exam findings, and initial radiographic and laboratory data in the context of their chronic comorbidities is felt to place them at high risk for further clinical deterioration. Furthermore, it is not anticipated that the patient will be medically stable for discharge from the hospital within 2 midnights of admission.   * I certify that at the point of admission it is my clinical judgment that  the patient will require inpatient hospital care spanning beyond 2 midnights from the point of admission due to high intensity of service, high risk for further deterioration and high frequency of surveillance required.*   For questions or updates, please contact [facility] Please consult [email or web address] for contact info under     Signed, Freddy Finner, MD  06/16/2023 5:01 AM

## 2023-06-16 NOTE — Plan of Care (Signed)

## 2023-06-16 NOTE — Care Management (Addendum)
 Transition of Care [facility]) - Inpatient Brief Assessment   Patient Details  Name: [name] MRN: [id number] Date of Birth: March 11, 1961  [facility] CM/SW Contact:    Lockie Pares, RN Phone Number: 06/16/2023, 4:03 PM   Clinical Narrative:  Patient with history of DM, CABG on 05/15/23 now presents with new onset chest pain with troponin elevation , NSTEMI cath  being performed to assess coronary arteries. Has SDOH that need addressing in Food insecurity and  Housing.  Patient is uninsured and may need medication assistance. TOC will follow for needs, recommendations,a nd transitions of care Transition of Care Asessment: Insurance and Status: Selfpay Patient has primary care physician: Yes   Prior level of function:: Independent Prior/Current Home Services: No current home services Social Drivers of Health Review: SDOH reviewed needs interventions Readmission risk has been reviewed: Yes Transition of care needs: transition of care needs identified, TOC will continue to follow

## 2023-06-16 NOTE — Progress Notes (Signed)
 PHARMACY - ANTICOAGULATION CONSULT NOTE  Pharmacy Consult for Heparin Indication: chest pain/ACS  No Known Allergies  Patient Measurements: Height: 5\' 3"  (160 cm) Weight: 68.9 kg (152 lb) IBW/kg (Calculated) : 56.9 HEPARIN DW (KG): 68.9  Vital Signs: Temp: 98.3 F (36.8 C) (03/28 0615) Temp Source: Oral (03/28 0615) BP: 118/75 (03/28 0845) Pulse Rate: 79 (03/28 0845)  Labs: Recent Labs    06/15/23 2128 06/16/23 0001 06/16/23 0624 06/16/23 0625  HGB 12.8*  --   --  13.0  HCT 39.9  --   --  39.3  PLT 207  --   --  202  LABPROT  --   --  13.2  --   INR  --   --  1.0  --   HEPARINUNFRC  --   --   --  0.24*  CREATININE 0.81  --   --   --   TROPONINIHS 2,609* 3,828*  --   --     Estimated Creatinine Clearance: 80.4 mL/min (by C-G formula based on SCr of 0.81 mg/dL).   Medical History: Past Medical History:  Diagnosis Date   Arthritis of left knee    Cervical stenosis of spine    Diabetes mellitus without complication (HCC)    High cholesterol    Hyperlipidemia LDL goal <70    Primary hypertension 02/07/2022   Tobacco use     Medications:  Infusions:   sodium chloride     Followed by   sodium chloride     heparin 850 Units/hr (06/16/23 0105)    Assessment: 63 yo M s/p CABG x 4 by Dr. Dorris Fetch on 05/15/23. Presents with chest pain.   Noted to have elevated troponin, EKG changes. On asa, plavix PTA but no anticoagulation.  CBC WNL.   Pharmacy consulted to dose IV heparin.  Plan transfer to [facility] for possible cardiac cath later today.   Initial heparin level slightly subtherapeutic on arrival at [facility] on 850 units/hr  Goal of Therapy:  Heparin level 0.3-0.7 units/ml Monitor platelets by anticoagulation protocol: Yes   Plan:  Increase heparin gtt to 950 units/hr F/u 6 hour heparin level, cath plans  Daylene Posey, PharmD, [facility] Clinical Pharmacist ED Pharmacist Phone # [phone] 06/16/2023 9:31 AM

## 2023-06-16 NOTE — Progress Notes (Addendum)
 Pre-cath orders written per Dr. Earmon Phoenix request. He reports that ASA 324mg  given after midnight qualifies for today's ASA dose. Notified Janne Napoleon plan for L heart cath with grafts and possible PCI.

## 2023-06-16 NOTE — Progress Notes (Signed)
 PHARMACY - ANTICOAGULATION CONSULT NOTE  Pharmacy Consult for Heparin Indication: chest pain/ACS  No Known Allergies  Patient Measurements: Height: 5\' 3"  (160 cm) Weight: 68.9 kg (152 lb) IBW/kg (Calculated) : 56.9 HEPARIN DW (KG): 68.9  Vital Signs: Temp: 97.8 F (36.6 C) (03/28 0101) Temp Source: Oral (03/28 0101) BP: 122/77 (03/28 0015) Pulse Rate: 72 (03/28 0015)  Labs: Recent Labs    06/15/23 2128 06/16/23 0001  HGB 12.8*  --   HCT 39.9  --   PLT 207  --   CREATININE 0.81  --   TROPONINIHS 2,609* 3,828*    Estimated Creatinine Clearance: 80.4 mL/min (by C-G formula based on SCr of 0.81 mg/dL).   Medical History: Past Medical History:  Diagnosis Date   Arthritis of left knee    Cervical stenosis of spine    Diabetes mellitus without complication (HCC)    High cholesterol    Hyperlipidemia LDL goal <70    Primary hypertension 02/07/2022   Tobacco use     Medications:  Infusions:   heparin 850 Units/hr (06/16/23 0105)    Assessment: 63 yo M s/p CABG x 4 by Dr. Dorris Fetch on 05/15/23. Presents with chest pain.   Noted to have elevated troponin, EKG changes. On asa, plavix PTA but no anticoagulation.  CBC WNL.   Pharmacy consulted to dose IV heparin.  Plan transfer to [facility] for possible cardiac cath later today.   Goal of Therapy:  Heparin level 0.3-0.7 units/ml Monitor platelets by anticoagulation protocol: Yes   Plan:  Give 4000 units bolus x 1 Start heparin infusion at 850 units/hr Check anti-Xa level in 6 hours and daily while on heparin Continue to monitor H&H and platelets  Junita Push PharmD 06/16/2023,1:19 AM

## 2023-06-16 NOTE — Interval H&P Note (Signed)
 History and Physical Interval Note:  06/16/2023 1:18 PM  [name]  has presented today for surgery, with the diagnosis of nstemi.  The various methods of treatment have been discussed with the patient and family. After consideration of risks, benefits and other options for treatment, the patient has consented to  Procedure(s): LEFT HEART CATH AND CORS/GRAFTS ANGIOGRAPHY (N/A) and possible coronary angioplasty as a surgical intervention.  The patient's history has been reviewed, patient examined, no change in status, stable for surgery.  I have reviewed the patient's chart and labs.  Questions were answered to the patient's satisfaction.     Yates Decamp

## 2023-06-16 NOTE — TOC Initial Note (Addendum)
 Transition of Care [facility]) - Initial/Assessment Note    Patient Details  Name: [name] MRN: [id number] Date of Birth: Mar 16, 1961  Transition of Care [facility]) CM/SW Contact:    Marliss Coots, LCSW Phone Number: 06/16/2023, 4:13 PM  Clinical Narrative:                  4:13 PM CSW introduced self and role to patient at bedside. CSW followed up on SDOH needs (food and housing). Patient confirmed needs and consented CSW to provide resources for transportation, rent/utilities, and food. CSW provided patient with lists of organizations in [facility] that provide financial and transportation assistance. CSW also provided patient with food stamps application and list of food pantries. Patient declined CSW of using an interpreter to converse but requested resources to be printed in the Spanish language. Resources CSW provided were printed in the Spanish language. Patient confirmed he lives in a home with a roommate and that friends are able to provide transportation for him. Patient confirmed he has a PCP but no insurance. CSW offered to consult financial counseling to assist patient in applying to Medicaid. Patient accepted CSW offer and CSW consulted financial counseling. Patient stated he lost his ID. CSW offered to provide printed out instructions on how to apply for new ID. Patient accepted CSW offer and CSW printed out instructions on obtaining a new ID. CSW offered contact information for patient to follow up. Patient accepted offer and CSW provided contact information.  Expected Discharge Plan: Home/Self Care Barriers to Discharge: Continued Medical Work up   Patient Goals and CMS Choice Patient states their goals for this hospitalization and ongoing recovery are:: to return home          Expected Discharge Plan and Services       Living arrangements for the past 2 months: Single Family Home                                      Prior Living Arrangements/Services Living  arrangements for the past 2 months: Single Family Home Lives with:: Roommate Patient language and need for interpreter reviewed:: Yes Do you feel safe going back to the place where you live?: Yes            Criminal Activity/Legal Involvement Pertinent to Current Situation/Hospitalization: No - Comment as needed  Activities of Daily Living   ADL Screening (condition at time of admission) Independently performs ADLs?: Yes (appropriate for developmental age) Is the patient deaf or have difficulty hearing?: No Does the patient have difficulty seeing, even when wearing glasses/contacts?: No Does the patient have difficulty concentrating, remembering, or making decisions?: No  Permission Sought/Granted Permission sought to share information with : Family Supports Permission granted to share information with : No (Contact information on chart)  Share Information with NAME: Albin Felling     Permission granted to share info w Relationship: Niece  Permission granted to share info w Contact Information: [phone]  Emotional Assessment Appearance:: Appears stated age Attitude/Demeanor/Rapport: Engaged Affect (typically observed): Accepting, Adaptable, Stable, Calm, Pleasant, Appropriate Orientation: : Oriented to Self, Oriented to Place, Oriented to  Time, Oriented to Situation Alcohol / Substance Use: Not Applicable Psych Involvement: No (comment)  Admission diagnosis:  Elevated troponin [R79.89] NSTEMI (non-ST elevated myocardial infarction) [facility]) [I21.4] Chest pain, unspecified type [R07.9] Patient Active Problem List   Diagnosis Date Noted   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  Atherosclerotic heart disease 05/12/2023   Cigarette smoker 05/12/2023   Mixed hyperlipidemia 05/12/2023   Continuous dependence on cigarette smoking 05/11/2023   NSTEMI (non-ST elevated myocardial infarction) (HCC) 05/10/2023   PAD (peripheral artery disease) (HCC) 05/10/2023   Abnormal urine odor 12/16/2022    Perianal abscess 09/14/2022   Aortic atherosclerosis (HCC) 09/14/2022   Arthralgia of both lower legs 09/14/2022   Encounter for examination following treatment at hospital 09/14/2022   Chronic pain of left knee 07/26/2022   Tobacco abuse counseling 05/30/2022   Right leg pain 05/30/2022   Constipation 02/07/2022   Type 2 diabetes mellitus with hyperglycemia (HCC) 02/07/2022   Chronic pain of right knee 02/07/2022   Cervical stenosis of spine 02/07/2022   Primary osteoarthritis of right shoulder 02/07/2022   Primary hypertension 02/07/2022   Dyslipidemia, goal LDL below 70 02/07/2022   PCP:  Donell Beers, FNP Pharmacy:   [facility] [address] [place] [address] Phone: [phone] Fax: [phone]     Social Drivers of Health (SDOH) Social History: SDOH Screenings   Food Insecurity: Food Insecurity Present (06/16/2023)  Housing: High Risk (06/16/2023)  Transportation Needs: No Transportation Needs (06/16/2023)  Utilities: Not At Risk (06/16/2023)  Depression (PHQ2-9): Low Risk  (12/16/2022)  Tobacco Use: High Risk (06/15/2023)   SDOH Interventions:     Readmission Risk Interventions     No data to display

## 2023-06-16 NOTE — ED Provider Notes (Signed)
  Physical Exam  BP 122/77   Pulse 72   Temp 97.8 F (36.6 C) (Oral)   Resp 12   Ht 5\' 3"  (1.6 m)   Wt 68.9 kg   SpO2 98%   BMI 26.93 kg/m   Physical Exam Vitals and nursing note reviewed.  Constitutional:      Appearance: He is well-developed. He is not ill-appearing.  Cardiovascular:     Rate and Rhythm: Normal rate.  Chest:     Chest wall: No mass or tenderness.  Abdominal:     Palpations: Abdomen is soft.  Musculoskeletal:        General: Normal range of motion.     Procedures  .Critical Care  Performed by: Marily Memos, MD Authorized by: Marily Memos, MD   Critical care provider statement:    Critical care time (minutes):  30   Critical care was necessary to treat or prevent imminent or life-threatening deterioration of the following conditions:  Cardiac failure   Critical care was time spent personally by me on the following activities:  Development of treatment plan with patient or surrogate, discussions with consultants, evaluation of patient's response to treatment, examination of patient, ordering and review of laboratory studies, ordering and review of radiographic studies, ordering and performing treatments and interventions, pulse oximetry, re-evaluation of patient's condition and review of old charts   ED Course / MDM   Clinical Course as of 06/16/23 0217  Fri Jun 16, 2023  0048 Consult to Dr. Hulan Saas, cardiology fellow to discuss uptrending troponin in context of recent CABG. Will proceed with heparin bolus and infusion, per cardiologist, and will plan for admission to cardiology at [facility] for likely urgent heart catheterization. I appreciate his collaboration in the care of this patient.  [RS]  0054 Repeat EKG without progression to STEMI, will not activate cath lab.  [RS]  0059 ED-ED transfer accepting EDP Dr. Clayborne Dana.  [RS]    Clinical Course User Index [RS] Sponseller, Eugene Gavia, PA-C   Medical Decision Making Amount and/or Complexity of Data  Reviewed Labs: ordered. Radiology: ordered.  Risk OTC drugs. Prescription drug management. Decision regarding hospitalization.   Received from [facility] for NSTEMI. Patient chest pain free currently. On heparin. Has had aspirin.  Paging cardiology for admit  D/w cardiology for admit.      Marily Memos, MD 06/16/23 [phone]

## 2023-06-16 NOTE — Plan of Care (Signed)
 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 Troponins trending from 2609 ->3828. ECG with non-specific ST changes, no elevations, and no dynamic changes on serial ECG. Altogether, this is c/f Type I NSTEMI. It is possible a graft went down, or has had a new plaque rupture.  Brief Plan: -s/p ASA load to 324mg  total -continue home clopidogrel -heparin bolus + infusion for ACS -transfer to [facility] for admission to cardiology here -NPO for likely cath today -echo today  Achille Rich, MD Cardiology

## 2023-06-17 LAB — CBC
HCT: 36.4 % — ABNORMAL LOW (ref 39.0–52.0)
Hemoglobin: 12.1 g/dL — ABNORMAL LOW (ref 13.0–17.0)
MCH: 30.3 pg (ref 26.0–34.0)
MCHC: 33.2 g/dL (ref 30.0–36.0)
MCV: 91.2 fL (ref 80.0–100.0)
Platelets: 188 10*3/uL (ref 150–400)
RBC: 3.99 MIL/uL — ABNORMAL LOW (ref 4.22–5.81)
RDW: 12.7 % (ref 11.5–15.5)
WBC: 7.4 10*3/uL (ref 4.0–10.5)
nRBC: 0 % (ref 0.0–0.2)

## 2023-06-17 LAB — BASIC METABOLIC PANEL WITH GFR
Anion gap: 7 (ref 5–15)
BUN: 13 mg/dL (ref 8–23)
CO2: 28 mmol/L (ref 22–32)
Calcium: 9.1 mg/dL (ref 8.9–10.3)
Chloride: 102 mmol/L (ref 98–111)
Creatinine, Ser: 0.67 mg/dL (ref 0.61–1.24)
GFR, Estimated: >60 mL/min (ref >60)
Glucose, Bld: 131 mg/dL — ABNORMAL HIGH (ref 70–99)
Potassium: 3.6 mmol/L (ref 3.5–5.1)
Sodium: 137 mmol/L (ref 135–145)

## 2023-06-17 MED ORDER — ISOSORBIDE DINITRATE 10 MG PO TABS
10.0000 mg | ORAL_TABLET | Freq: Three times a day (TID) | ORAL | Status: DC
  Administered 2023-06-17 (×3): 10 mg via ORAL
  Filled 2023-06-17 (×4): qty 1

## 2023-06-17 MED ORDER — ISOSORBIDE MONONITRATE ER 30 MG PO TB24: 30.0000 mg | ORAL_TABLET | Freq: Every day | ORAL | Status: DC

## 2023-06-17 NOTE — Plan of Care (Signed)
  Problem: Education: Goal: Understanding of cardiac disease, CV risk reduction, and recovery process will improve Outcome: Progressing   Problem: Activity: Goal: Ability to tolerate increased activity will improve Outcome: Progressing   Problem: Health Behavior/Discharge Planning: Goal: Ability to safely manage health-related needs after discharge will improve Outcome: Progressing

## 2023-06-17 NOTE — Discharge Instructions (Signed)
 Post NSTEMI: NO HEAVY LIFTING X 2 WEEKS. NO SEXUAL ACTIVITY X 2 WEEKS. NO DRIVING X 1 WEEK. NO SOAKING BATHS, HOT TUBS, POOLS, ETC., X 7 DAYS.   Groin Site Care: Refer to this sheet in the next few weeks. These instructions provide you with information on caring for yourself after your procedure. Your caregiver may also give you more specific instructions. Your treatment has been planned according to current medical practices, but problems sometimes occur. Call your caregiver if you have any problems or questions after your procedure. HOME CARE INSTRUCTIONS You may shower 24 hours after the procedure. Remove the bandage (dressing) and gently wash the site with plain soap and water. Gently pat the site dry.  Do not apply powder or lotion to the site.  Do not sit in a bathtub, swimming pool, or whirlpool for 5 to 7 days.  No bending, squatting, or lifting anything over 10 pounds (4.5 kg) as directed by your caregiver.  Inspect the site at least twice daily.  Do not drive home if you are discharged the same day of the procedure. Have someone else drive you.  What to expect: Any bruising will usually fade within 1 to 2 weeks.  Blood that collects in the tissue (hematoma) may be painful to the touch. It should usually decrease in size and tenderness within 1 to 2 weeks.  SEEK IMMEDIATE MEDICAL CARE IF: You have unusual pain at the groin site or down the affected leg.  You have redness, warmth, swelling, or pain at the groin site.  You have drainage (other than a small amount of blood on the dressing).  You have chills.  You have a fever or persistent symptoms for more than 72 hours.  You have a fever and your symptoms suddenly get worse.  Your leg becomes pale, cool, tingly, or numb.  You have heavy bleeding from the site. Hold pressure on the site.

## 2023-06-17 NOTE — Discharge Summary (Incomplete)
 Discharge Summary    Patient ID: [name] MRN: [id number]; DOB: 1960/07/12  Admit date: 06/15/2023 Discharge date: 06/18/2023  PCP:  Donell Beers, FNP   [facility] Providers Cardiologist:  None        Discharge Diagnoses    Principal Problem:   NSTEMI (non-ST elevated myocardial infarction) [facility]) Active Problems:   CAD (coronary artery disease)   Type 2 diabetes mellitus with complication, without long-term current use of insulin (HCC)   Primary hypertension   Hyperlipidemia    Diagnostic Studies/Procedures    Echocardiogram 06/16/2023: Impressions:  1. Left ventricular ejection fraction, by estimation, is 55 to 60%. The  left ventricle has normal function. The left ventricle has no regional  wall motion abnormalities. There is mild left ventricular hypertrophy.  Left ventricular diastolic parameters  were normal. The average left ventricular global longitudinal strain is  -10.0 %. The global longitudinal strain is abnormal.   2. Right ventricular systolic function is normal. The right ventricular  size is normal. Tricuspid regurgitation signal is inadequate for assessing  PA pressure.   3. The mitral valve is normal in structure. No evidence of mitral valve  regurgitation. No evidence of mitral stenosis.   4. The aortic valve is tricuspid. Aortic valve regurgitation is not  visualized. No aortic stenosis is present.   5. The inferior vena cava is normal in size with greater than 50%  respiratory variability, suggesting right atrial pressure of 3 mmHg.   Comparison(s): A prior study was performed on 05/12/2023. LVEF improved  compared to prior study.  _____________  Left Cardiac Catheterization 06/16/2023: Hemodynamic data: LVEDP 12 mmHg.  There is no pressure gradient across the aortic valve.   Angiographic data: LV: Normal LV systolic function, EF 55 to 60% without regional wall motion abnormality. LM: Large vessel with no significant  disease. LCx: It is a moderate caliber vessel.  Gives origin to a moderate-sized OM1 with a high-grade 95 to 99% stenosis.  CX at the termination is moderately diffusely diseased. LAD: Large-caliber vessel however severely diseased all the way from the ostium to the mid segment constituting a 90 to 95% stenosis and a tandem 80% stenosis.  Mid segment gives origin to large D1 which has a ostial 95% stenosis however it is now kinked up due to vein graft insertion.  LAD is occluded after the origin of D1. RI: Was previously moderate-sized vessel now a small vessel with a diffusely diseased proximal 90% stenosis.      History of Present Illness     [name] is a 63 y.o. male with a history of CAD with recent NSTEMI s/p CABG x4 (LIMA-LAD, SVG-Diag, sequential SVG-RI-OM) on 05/15/2023, hypertension, hyperlipidemia, type 2 diabetes mellitus, and tobacco abuse who was admitted on 06/16/2023 for NSTEMI after presenting with chest pain with associated numbness in his left fingers.  Hospital Course     Consultants: None   NSTEMI Patient has a history of CAD with recent NSTEMI s/p CABG x4 with LIMA to LAD, SVG to Diag, and sequential SVG to RI and OM on 05/15/2023. He was readmitted on 06/16/2023 with NSTEMI after presenting with chest pain with associated numbness in his left fingers. EKG showed no acute ischemic changes. High-sensitivity elevated at 2,609 >> 3,828. He was started on IV Heparin. Echo showed LVEF of 55-60% with no regional wall motion abnormalities, normal RV function, and no significant valvular disease. He underwent LHC which showed occluded SVG to Diag and occluded sequential SVG to  RI and OM but patent LIMA to LAD. Native LAD was severely diseased and disease extends into a moderate to large size D1; however, the D1 is kinked at the graft insertion site with extremely difficult anatomy. Native OM appeared amenable for angioplasty; however, there was also severe disease involving a distal  moderate to large PL branch. Medical therapy was recommended with plans to consider PCI if he has recurrent angina. He was having some right shoulder pain following cardiac catheterization and started on Isordil with improvement in symptoms. No recurrent chest pain. Will transition to Imdur 30mg  daily at discharge. Continue DAPT with Aspirin 81mg  daily and Plavix 75mg  daily. Continue home Lopressor 12.5mg  twice daily and Lipitor 80mg  daily. He does not have sublingual Nitroglycerin at home. BP has been soft here with systolic BP in the 90s to low 045W and he has not way of checking his BP at home. Therefore, will not prescribe PRN sublingual Nitroglycerin at discharge given concerns of him taken this with baseline soft BP.  Hypertension BP soft but stable this admission. Continue low dose Toprol-XL as above.  Hyperlipidemia Lipid panel in 04/2023 (during recent admission for NSTEMI): Total Cholesterol 145, Triglycerides 85, HDL 24, LDL 104. Lipoprotein (a) in 04/2023 was 209.3. LDL goal <55. Repeat lipid panel was not checked this admission. Continue Lipitor 80mg  daily. Recommend repeat lipid panel and LFTs at follow-up visit.   Type 2 Diabetes Mellitus Hemoglobin A1c 6.7% in 04/2023. Can resume home Metformin 48 hours after cardiac catheterization.    Patient seen and examined by Dr. Jimmey Ralph today and felt to be stable for discharge. Outpatient follow-up arranged. Medications as below.    Did the patient have an acute coronary syndrome (MI, NSTEMI, STEMI, etc) this admission?:  Yes                               AHA/ACC ACS Clinical Performance & Quality Measures: Aspirin prescribed? - Yes ADP Receptor Inhibitor (Plavix/Clopidogrel, Brilinta/Ticagrelor or Effient/Prasugrel) prescribed (includes medically managed patients)? - Yes Beta Blocker prescribed? - Yes High Intensity Statin (Lipitor 40-80mg  or Crestor 20-40mg ) prescribed? - Yes EF assessed during THIS hospitalization? - Yes For EF <40%, was  ACEI/ARB prescribed? - Not Applicable (EF >/= 40%) For EF <40%, Aldosterone Antagonist (Spironolactone or Eplerenone) prescribed? - Not Applicable (EF >/= 40%) Cardiac Rehab Phase II ordered (including medically managed patients)? - Yes    The patient will be scheduled for a TOC follow up appointment within 14 days.  A message has been sent to the [facility] and Scheduling Pool at the office where the patient should be seen for follow up.  _____________  Discharge Vitals Blood pressure 98/67, pulse 84, temperature 98.3 F (36.8 C), temperature source Oral, resp. rate 18, height 5\' 3"  (1.6 m), weight 68.9 kg, SpO2 98%.  Filed Weights   06/15/23 2112  Weight: 68.9 kg    Labs & Radiologic Studies    CBC Recent Labs    06/17/23 0236 06/18/23 0223  WBC 7.4 7.4  HGB 12.1* 11.5*  HCT 36.4* 34.9*  MCV 91.2 91.6  PLT 188 170   Basic Metabolic Panel Recent Labs    09/81/19 2128 06/17/23 0236  NA 134* 137  K 3.5 3.6  CL 102 102  CO2 24 28  GLUCOSE 103* 131*  BUN 24* 13  CREATININE 0.81 0.67  CALCIUM 9.1 9.1   Liver Function Tests No results for input(s): "AST", "ALT", "ALKPHOS", "  BILITOT", "PROT", "ALBUMIN" in the last 72 hours. No results for input(s): "LIPASE", "AMYLASE" in the last 72 hours. High Sensitivity Troponin:   Recent Labs  Lab 06/15/23 2128 06/16/23 0001  TROPONINIHS 2,609* 3,828*    BNP Invalid input(s): "POCBNP" D-Dimer No results for input(s): "DDIMER" in the last 72 hours. Hemoglobin A1C No results for input(s): "HGBA1C" in the last 72 hours. Fasting Lipid Panel No results for input(s): "CHOL", "HDL", "LDLCALC", "TRIG", "CHOLHDL", "LDLDIRECT" in the last 72 hours. Thyroid Function Tests No results for input(s): "TSH", "T4TOTAL", "T3FREE", "THYROIDAB" in the last 72 hours.  Invalid input(s): "FREET3" _____________  CARDIAC CATHETERIZATION Result Date: 06/16/2023 Images from the original result were not included. Cardiac Catheterization 06/16/23:  Hemodynamic data: LVEDP 12 mmHg.  There is no pressure gradient across the aortic valve. Angiographic data: LV: Normal LV systolic function, EF 55 to 60% without regional wall motion abnormality. LM: Large vessel with no significant disease. LCx: It is a moderate caliber vessel.  Gives origin to a moderate-sized OM1 with a high-grade 95 to 99% stenosis.  CX at the termination is moderately diffusely diseased. LAD: Large-caliber vessel however severely diseased all the way from the ostium to the mid segment constituting a 90 to 95% stenosis and a tandem 80% stenosis.  Mid segment gives origin to large D1 which has a ostial 95% stenosis however it is now kinked up due to vein graft insertion.  LAD is occluded after the origin of D1. RI: Was previously moderate-sized vessel now a small vessel with a diffusely diseased proximal 90% stenosis. Impression and recommendations: NSTEMI secondary to recent occlusion of SVG graft to D1 and sequential OM1 and RI.  LAD is severely diseased all the way from the ostium and disease extends into the moderate to large size D1 however the D1 is kinked at the graft insertion site and extremely difficult anatomy and a long segment disease hence left alone. OM lesion appears to be amenable for angioplasty however patient has diffuse disease also involving distal PL branch which is moderate to large size with a secondary branch, patient's LVEF is normal, we will treat him medically aggressively and if he has recurrence of angina we will have low threshold to proceed with coronary angiography.   ECHOCARDIOGRAM COMPLETE Result Date: 06/16/2023    ECHOCARDIOGRAM REPORT   Patient Name:   [name]  Date of Exam: 06/16/2023 Medical Rec #:  [id number]  Height:       63.0 in Accession #:    [id number] Weight:       152.0 lb Date of Birth:  08-08-1960  BSA:          1.721 m Patient Age:    62 years   BP:           113/75 mmHg Patient Gender: M          HR:           75 bpm. Exam Location:   Inpatient Procedure: 2D Echo (Both Spectral and Color Flow Doppler were utilized during            procedure). Indications:    NSTEMI  History:        Patient has prior history of Echocardiogram examinations, most                 recent 05/12/2023. Prior CABG, PAD; Risk Factors:Diabetes,                 Hypertension, Dyslipidemia and Current Smoker.  Sonographer:    Delcie Roch RDCS Referring Phys: [id number] Freddy Finner  Sonographer Comments: Global longitudinal strain was attempted. IMPRESSIONS  1. Left ventricular ejection fraction, by estimation, is 55 to 60%. The left ventricle has normal function. The left ventricle has no regional wall motion abnormalities. There is mild left ventricular hypertrophy. Left ventricular diastolic parameters were normal. The average left ventricular global longitudinal strain is -10.0 %. The global longitudinal strain is abnormal.  2. Right ventricular systolic function is normal. The right ventricular size is normal. Tricuspid regurgitation signal is inadequate for assessing PA pressure.  3. The mitral valve is normal in structure. No evidence of mitral valve regurgitation. No evidence of mitral stenosis.  4. The aortic valve is tricuspid. Aortic valve regurgitation is not visualized. No aortic stenosis is present.  5. The inferior vena cava is normal in size with greater than 50% respiratory variability, suggesting right atrial pressure of 3 mmHg. Comparison(s): A prior study was performed on 05/12/2023. LVEF improved compared to prior study. FINDINGS  Left Ventricle: Left ventricular ejection fraction, by estimation, is 55 to 60%. The left ventricle has normal function. The left ventricle has no regional wall motion abnormalities. The average left ventricular global longitudinal strain is -10.0 %. Strain was performed and the global longitudinal strain is abnormal. The left ventricular internal cavity size was normal in size. There is mild left ventricular hypertrophy.  Abnormal (paradoxical) septal motion consistent with post-operative status. Left ventricular diastolic parameters were normal. Right Ventricle: The right ventricular size is normal. No increase in right ventricular wall thickness. Right ventricular systolic function is normal. Tricuspid regurgitation signal is inadequate for assessing PA pressure. Left Atrium: Left atrial size was normal in size. Right Atrium: Right atrial size was normal in size. Pericardium: There is no evidence of pericardial effusion. Mitral Valve: The mitral valve is normal in structure. No evidence of mitral valve regurgitation. No evidence of mitral valve stenosis. Tricuspid Valve: The tricuspid valve is normal in structure. Tricuspid valve regurgitation is trivial. No evidence of tricuspid stenosis. Aortic Valve: The aortic valve is tricuspid. Aortic valve regurgitation is not visualized. No aortic stenosis is present. Pulmonic Valve: The pulmonic valve was grossly normal. Pulmonic valve regurgitation is not visualized. No evidence of pulmonic stenosis. Aorta: The aortic root and ascending aorta are structurally normal, with no evidence of dilitation. Venous: The inferior vena cava is normal in size with greater than 50% respiratory variability, suggesting right atrial pressure of 3 mmHg. IAS/Shunts: No atrial level shunt detected by color flow Doppler.  LEFT VENTRICLE PLAX 2D LVIDd:         5.20 cm     Diastology LVIDs:         3.40 cm     LV e' medial:    6.20 cm/s LV PW:         1.00 cm     LV E/e' medial:  6.8 LV IVS:        1.10 cm     LV e' lateral:   9.79 cm/s LVOT diam:     2.20 cm     LV E/e' lateral: 4.3 LV SV:         54 LV SV Index:   32          2D Longitudinal Strain LVOT Area:     3.80 cm    2D Strain GLS Avg:     -10.0 %  LV Volumes (MOD) LV vol d, MOD A4C: 75.9 ml LV  vol s, MOD A4C: 43.5 ml LV SV MOD A4C:     75.9 ml RIGHT VENTRICLE             IVC RV Basal diam:  2.80 cm     IVC diam: 1.50 cm RV S prime:     11.10 cm/s  TAPSE (M-mode): 1.6 cm LEFT ATRIUM             Index        RIGHT ATRIUM           Index LA diam:        3.40 cm 1.98 cm/m   RA Area:     14.80 cm LA Vol (A2C):   37.7 ml 21.91 ml/m  RA Volume:   35.90 ml  20.86 ml/m LA Vol (A4C):   33.5 ml 19.47 ml/m LA Biplane Vol: 36.1 ml 20.98 ml/m  AORTIC VALVE LVOT Vmax:   85.20 cm/s LVOT Vmean:  53.900 cm/s LVOT VTI:    0.143 m  AORTA Ao Root diam: 3.20 cm Ao Asc diam:  3.40 cm MITRAL VALVE MV Area (PHT): 4.89 cm    SHUNTS MV Decel Time: 155 msec    Systemic VTI:  0.14 m MV E velocity: 42.30 cm/s  Systemic Diam: 2.20 cm MV A velocity: 47.00 cm/s MV E/A ratio:  0.90 Sunit Tolia Electronically signed by Tessa Lerner Signature Date/Time: 06/16/2023/1:02:07 PM    Final    DG Chest 2 View Result Date: 06/15/2023 CLINICAL DATA:  Left upper chest pain. EXAM: CHEST - 2 VIEW COMPARISON:  06/15/2023. FINDINGS: The heart size and mediastinal contours are stable. Mild atelectasis or scarring is noted at the lung bases. No consolidation, effusion, or pneumothorax. Sternotomy wires are noted. Degenerative changes are present in the thoracic spine. No acute osseous abnormality. IMPRESSION: No active cardiopulmonary disease. Electronically Signed   By: Thornell Sartorius M.D.   On: 06/15/2023 21:42   DG Chest 2 View Result Date: 06/15/2023 CLINICAL DATA:  Status post coronary artery bypass graft. EXAM: CHEST - 2 VIEW COMPARISON:  May 17, 2023. FINDINGS: The heart size and mediastinal contours are within normal limits. Both lungs are clear. The visualized skeletal structures are unremarkable. IMPRESSION: No active cardiopulmonary disease. Electronically Signed   By: Lupita Raider M.D.   On: 06/15/2023 12:31   Disposition   Patient is being discharged home today in good condition.  Follow-up Plans & Appointments     Follow-up Information     Beatrice Lecher, PA-C Follow up.   Specialties: Cardiology, Physician Assistant Why: Hospital follow-up with Cardiology  scheduled for 06/27/2023 at 8:50am. Please arrive 15 minutes early for check-in. If this date/ time does not work for you, please call our office to reschedule. Contact information: [address] [place] [address] [phone]                Discharge Instructions     Amb Referral to Cardiac Rehabilitation   Complete by: As directed    Will need spanish interpreter is interested.   Diagnosis: NSTEMI   After initial evaluation and assessments completed: Virtual Based Care may be provided alone or in conjunction with Phase 2 Cardiac Rehab based on patient barriers.: Yes   Intensive Cardiac Rehabilitation (ICR) [facility] location only OR Traditional Cardiac Rehabilitation (TCR) *If criteria for ICR are not met will enroll in TCR [facility] only): Yes   Diet - low sodium heart healthy   Complete by: As directed  Increase activity slowly   Complete by: As directed         Discharge Medications   Allergies as of 06/18/2023   No Known Allergies      Medication List     PAUSE taking these medications    metFORMIN 1000 MG tablet Wait to take this until: June 19, 2023 Morning Commonly known as: GLUCOPHAGE Take 1 tablet (1,000 mg total) by mouth 2 (two) times daily with a meal.       STOP taking these medications    polyethylene glycol 17 g packet Commonly known as: MiraLax       TAKE these medications    acetaminophen 325 MG tablet Commonly known as: Tylenol Take 2 tablets (650 mg total) by mouth every 6 (six) hours as needed for mild pain (pain score 1-3).   aspirin EC 81 MG tablet Take 1 tablet (81 mg total) by mouth daily. Swallow whole. What changed: how much to take   atorvastatin 80 MG tablet Commonly known as: LIPITOR Tome 1 tableta (80 mg en total) por va oral diariamente. (Take 1 tablet (80 mg total) by mouth daily.)   clopidogrel 75 MG tablet Commonly known as: PLAVIX Tome 1 tableta (75 mg en total) por va oral  diariamente. (Take 1 tablet (75 mg total) by mouth daily.)   gabapentin 300 MG capsule Commonly known as: NEURONTIN Take 1 capsule (300 mg total) by mouth 3 (three) times daily. What changed: additional instructions   isosorbide mononitrate 30 MG 24 hr tablet Commonly known as: IMDUR Take 1 tablet (30 mg total) by mouth daily. Start taking on: June 19, 2023   metoprolol tartrate 25 MG tablet Commonly known as: LOPRESSOR Take 0.5 tablets (12.5 mg total) by mouth 2 (two) times daily.   oxyCODONE 5 MG immediate release tablet Commonly known as: Oxy IR/ROXICODONE Take 1 tablet (5 mg total) by mouth every 6 (six) hours as needed for severe pain (pain score 7-10).           Outstanding Labs/Studies   Repeat lipid panel and LFTs at follow-up visit.  Signed, Corrin Johnita Palleschi, PA-C 06/18/2023, 11:17 AM  I have seen, examined the patient, and reviewed the above assessment and plan.    Hospital Course and Plan: [name] is a 63 y.o. male with HTN, T2DM, HLD, NSTEMI (05/10/2023), CABG x 4 (05/15/2023) who presented with acute chest pain and was found to have NSTEMI.  He was taken for urgent left heart catheterization and was found to have occlusion of his SVG graft to D1 and sequential OM1 and RI.  Unfortunately, due to difficult anatomy and diffuse disease he was not felt to be amenable to revascularization.  Given that he has a normal LVEF decision was made to manage him medically.  He remained in the hospital for medication optimization.  Ultimately, he was discharged on a regimen of aspirin 81 mg once daily, Plavix 75 mg once daily, Imdur 30 mg once daily, metoprolol XL 12.5 mg twice daily.  GEN: No acute distress.   Neck: No JVD Cardiac: Normal rate, regular. Respiratory: Clear to auscultation bilaterally. GI: Soft, nontender, non-distended  MS: No edema  Duration of Discharge Encounter:55 minutes MD Time: 30 minutes  Nobie Putnam, MD 06/22/2023 9:01 PM

## 2023-06-17 NOTE — Progress Notes (Signed)
 Patient Name: [name] Date of Encounter: 06/17/2023 [facility] Cardiologist: None   Interval Summary  .    Urgent left heart cath yesterday with occluded SVG but not amenable to revascularization.  No acute overnight events. Some bilateral shoulder discomfort, intermittent. No chest pain or shortness of breath at rest or with ambulation.  Vital Signs .    Vitals:   06/16/23 1900 06/16/23 2342 06/17/23 0356 06/17/23 0742  BP: 113/75 103/70 110/72 96/63  Pulse: 83 79 83   Resp: 17 13 13    Temp: 98.3 F (36.8 C) 97.6 F (36.4 C) 98.3 F (36.8 C) 98.4 F (36.9 C)  TempSrc: Oral Oral Oral Oral  SpO2: 98% 98% 98%   Weight:      Height:        Intake/Output Summary (Last 24 hours) at 06/17/2023 0924 Last data filed at 06/17/2023 0744 Gross per 24 hour  Intake 480 ml  Output 1300 ml  Net -820 ml      06/15/2023    9:12 PM 06/14/2023    3:24 PM 06/06/2023   11:15 AM  Last 3 Weights  Weight (lbs) 152 lb 150 lb 147 lb 3.2 oz  Weight (kg) 68.947 kg 68.04 kg 66.769 kg      Telemetry/ECG    Sinus rhythm, no VAs - Personally Reviewed  Physical Exam .   GEN: No acute distress.   Neck: No JVD Cardiac: Normal rate, regular. Respiratory: Clear to auscultation bilaterally. GI: Soft, nontender, non-distended  MS: No edema  Echo 06/17/23:   1. Left ventricular ejection fraction, by estimation, is 55 to 60%. The  left ventricle has normal function. The left ventricle has no regional  wall motion abnormalities. There is mild left ventricular hypertrophy.  Left ventricular diastolic parameters  were normal. The average left ventricular global longitudinal strain is  -10.0 %. The global longitudinal strain is abnormal.   2. Right ventricular systolic function is normal. The right ventricular  size is normal. Tricuspid regurgitation signal is inadequate for assessing  PA pressure.   3. The mitral valve is normal in structure. No evidence of mitral valve   regurgitation. No evidence of mitral stenosis.   4. The aortic valve is tricuspid. Aortic valve regurgitation is not  visualized. No aortic stenosis is present.   5. The inferior vena cava is normal in size with greater than 50%  respiratory variability, suggesting right atrial pressure of 3 mmHg.   Assessment & Plan .     [name] is a 63 y.o. male with HTN, T2DM, HLD, NSTEMI (05/10/2023), CABG x 4 (05/15/2023) who presented with acute chest pain and was found to have NSTEMI.  He was taken for urgent left heart catheterization and was found to have occlusion of his SVG graft to D1 and sequential OM1 and RI.  Unfortunately, due to difficult anatomy and diffuse disease he was not felt to be amenable to revascularization.  Given that he has a normal LVEF decision was made to manage him medically.  #. Acute NSTEMI, type I #. 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 -Continue Plavix 75 mg daily. -Will start long-acting nitroglycerin, Isordil 10 mg 3 times daily for now and transition to Imdur on discharge. -Continue low-dose metoprolol XL 12.5 mg once daily.   #. HLD #. Aortic Atherosclerosis #. PAD -Continue aspirin, Plavix, atorvastatin as above.   #. DM2: Last A1c 6.6.  -Holding home metformin -Resumed home gabapentin -No SSI at  this time as mild DM2   #. HTN: Normotensive here. -Continue home metoprolol.also starting long-acting nitrate as above given ongoing basilar ICD.  Likely discharge tomorrow.    For questions or updates, please contact [facility] Please consult [email or web address] for contact info under    Signed, Nobie Putnam, MD

## 2023-06-17 NOTE — Progress Notes (Signed)
 CARDIAC REHAB PHASE I   PRE:  Rate/Rhythm: 92  BP:  Supine: 91/64 asymptomatic      SaO2: 96 RA  MODE:  Ambulation: 680 ft   POST:  Rate/Rhythm: 95  BP:  Supine: 103/75      SaO2: 97RA  With the assistance of a mobile interpreter, pt tolerated exercise well and AMB 680 ft with front wheel Cephus Tupy and standby assist. Pt had no rest break, chest pain, SOB or pain. Education given to pt in spanish on heart healthy diet, radial/femoral weight restrictions, MI booklet, adherence to NTG, Brilinta and ASA.  Home exercise guidelines given and will refer to cardiac rehab phase 2 in [facility] (will need spanish interpreter if interested). Pt left in the chair with call bell in reach. All questions were answered and pt verbalized understanding.  Harrie Jeans ACSM-CEP 06/17/2023 2:05 PM

## 2023-06-18 ENCOUNTER — Telehealth: Payer: Self-pay | Admitting: Student

## 2023-06-18 LAB — CBC
HCT: 34.9 % — ABNORMAL LOW (ref 39.0–52.0)
Hemoglobin: 11.5 g/dL — ABNORMAL LOW (ref 13.0–17.0)
MCH: 30.2 pg (ref 26.0–34.0)
MCHC: 33 g/dL (ref 30.0–36.0)
MCV: 91.6 fL (ref 80.0–100.0)
Platelets: 170 10*3/uL (ref 150–400)
RBC: 3.81 MIL/uL — ABNORMAL LOW (ref 4.22–5.81)
RDW: 12.9 % (ref 11.5–15.5)
WBC: 7.4 10*3/uL (ref 4.0–10.5)
nRBC: 0 % (ref 0.0–0.2)

## 2023-06-18 MED ORDER — ISOSORBIDE MONONITRATE ER 30 MG PO TB24
30.0000 mg | ORAL_TABLET | Freq: Every day | ORAL | Status: DC
  Administered 2023-06-18: 30 mg via ORAL
  Filled 2023-06-18: qty 1

## 2023-06-18 MED ORDER — ASPIRIN 81 MG PO TBEC
81.0000 mg | DELAYED_RELEASE_TABLET | Freq: Every day | ORAL | 3 refills | Status: AC
  Filled 2023-06-18 – 2024-01-09 (×4): qty 90, 90d supply, fill #0

## 2023-06-18 MED ORDER — NITROGLYCERIN 0.4 MG SL SUBL
0.4000 mg | SUBLINGUAL_TABLET | SUBLINGUAL | 3 refills | Status: DC | PRN
  Filled 2023-06-18: qty 25, 1d supply, fill #0

## 2023-06-18 MED ORDER — ISOSORBIDE MONONITRATE ER 30 MG PO TB24
30.0000 mg | ORAL_TABLET | Freq: Every day | ORAL | 2 refills | Status: DC
  Filled 2023-06-18 – 2023-06-19 (×2): qty 30, 30d supply, fill #0
  Filled 2023-07-04: qty 30, 30d supply, fill #1

## 2023-06-18 NOTE — Progress Notes (Signed)
 Patient Name: Isay Perleberg Date of Encounter: 06/18/2023 [facility] Cardiologist: None   Interval Summary  .    No acute overnight events. Bilateral shoulder discomfort has resolved. He has ambulated the halls with PT. No chest pain or shortness of breath at rest or with ambulation. No new or acute complaints today.  Interview conducted using video interpreter services.   Vital Signs .    Vitals:   06/17/23 2248 06/18/23 0247 06/18/23 0400 06/18/23 0729  BP: 101/71 95/64  98/67  Pulse: 83 84    Resp: 18 18    Temp: 97.9 F (36.6 C) 98.1 F (36.7 C)  98.3 F (36.8 C)  TempSrc: Oral Oral  Oral  SpO2: 98% 96% 98%   Weight:      Height:        Intake/Output Summary (Last 24 hours) at 06/18/2023 0852 Last data filed at 06/18/2023 0730 Gross per 24 hour  Intake 730 ml  Output 750 ml  Net -20 ml      06/15/2023    9:12 PM 06/14/2023    3:24 PM 06/06/2023   11:15 AM  Last 3 Weights  Weight (lbs) 152 lb 150 lb 147 lb 3.2 oz  Weight (kg) 68.947 kg 68.04 kg 66.769 kg      Telemetry/ECG    Sinus rhythm, no VAs - Personally Reviewed  Physical Exam .   GEN: No acute distress.   Neck: No JVD Cardiac: Normal rate, regular. Respiratory: Clear to auscultation bilaterally. GI: Soft, nontender, non-distended  MS: No edema  Echo 06/17/23:   1. Left ventricular ejection fraction, by estimation, is 55 to 60%. The  left ventricle has normal function. The left ventricle has no regional  wall motion abnormalities. There is mild left ventricular hypertrophy.  Left ventricular diastolic parameters  were normal. The average left ventricular global longitudinal strain is  -10.0 %. The global longitudinal strain is abnormal.   2. Right ventricular systolic function is normal. The right ventricular  size is normal. Tricuspid regurgitation signal is inadequate for assessing  PA pressure.   3. The mitral valve is normal in structure. No evidence of mitral valve  regurgitation.  No evidence of mitral stenosis.   4. The aortic valve is tricuspid. Aortic valve regurgitation is not  visualized. No aortic stenosis is present.   5. The inferior vena cava is normal in size with greater than 50%  respiratory variability, suggesting right atrial pressure of 3 mmHg.   Assessment & Plan .     Shafin Pollio is a 63 y.o. male with HTN, T2DM, HLD, NSTEMI (05/10/2023), CABG x 4 (05/15/2023) who presented with acute chest pain and was found to have NSTEMI.  He was taken for urgent left heart catheterization and was found to have occlusion of his SVG graft to D1 and sequential OM1 and RI.  Unfortunately, due to difficult anatomy and diffuse disease he was not felt to be amenable to revascularization.  Given that he has a normal LVEF decision was made to manage him medically.  #. Acute NSTEMI, type I #. 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 -Continue Plavix 75 mg daily. -He tolerated Isordil 10 mg 3 times daily yesterday. Will transition to Imdur 30mg  for discharge. -Continue low-dose metoprolol XL 12.5 mg twice daily.   #. HLD #. Aortic Atherosclerosis #. PAD -Continue aspirin, Plavix, atorvastatin as above.   #. DM2: Last A1c 6.6.  -Holding home metformin -Resumed home gabapentin -No  SSI at this time as mild DM2   #. HTN: Normotensive here. -Continue home metoprolol.also starting long-acting nitrate as above given ongoing basilar ICD.  Discharge today with clinic follow up in 1-2 weeks.   For questions or updates, please contact [facility] Please consult [email or web address] for contact info under    Signed, Nobie Putnam, MD

## 2023-06-18 NOTE — Telephone Encounter (Signed)
   Transition of Care Follow-up Phone Call Request    Patient Name: [name] Date of Birth: 04/21/1960 Date of Encounter: 06/18/2023  Primary Care Provider:  Donell Beers, FNP Primary Cardiologist:  None  [name] has been scheduled for a transition of care follow up appointment with a [facility] provider:  Tereso Newcomer, PA-C, on 06/27/2023 at 8:50am.  Please reach out to [facility] within 48 hours of discharge to confirm appointment and review transition of care protocol questionnaire. Anticipated discharge date: 06/18/2023  Corrin Parker, PA-C  06/18/2023, 1:01 PM

## 2023-06-19 ENCOUNTER — Other Ambulatory Visit (HOSPITAL_COMMUNITY): Payer: Self-pay

## 2023-06-19 ENCOUNTER — Other Ambulatory Visit: Payer: Self-pay

## 2023-06-19 ENCOUNTER — Encounter (HOSPITAL_COMMUNITY): Payer: Self-pay | Admitting: Cardiology

## 2023-06-20 NOTE — Telephone Encounter (Signed)
 Patient contacted regarding discharge from [facility] on 06/18/2023.  Patient understands to follow up with provider Tereso Newcomer on 06/27/2023 at 08:50 at [facility]. Patient understands discharge instructions? Yes} Patient understands medications and regiment? Yes Patient understands to bring all medications to this visit? Yes  Ask patient:  Are you enrolled in My Chart No.  I called patient and he stated he did not need interpreter. He verbalized understanding

## 2023-06-21 ENCOUNTER — Ambulatory Visit (INDEPENDENT_AMBULATORY_CARE_PROVIDER_SITE_OTHER): Payer: Self-pay | Admitting: Nurse Practitioner

## 2023-06-21 ENCOUNTER — Other Ambulatory Visit (HOSPITAL_COMMUNITY): Payer: Self-pay

## 2023-06-21 ENCOUNTER — Encounter: Payer: Self-pay | Admitting: Nurse Practitioner

## 2023-06-21 VITALS — BP 91/61 | HR 79 | Temp 97.0°F | Wt 150.0 lb

## 2023-06-21 DIAGNOSIS — I2510 Atherosclerotic heart disease of native coronary artery without angina pectoris: Secondary | ICD-10-CM

## 2023-06-21 DIAGNOSIS — Z951 Presence of aortocoronary bypass graft: Secondary | ICD-10-CM

## 2023-06-21 DIAGNOSIS — Z09 Encounter for follow-up examination after completed treatment for conditions other than malignant neoplasm: Secondary | ICD-10-CM | POA: Insufficient documentation

## 2023-06-21 DIAGNOSIS — I214 Non-ST elevation (NSTEMI) myocardial infarction: Secondary | ICD-10-CM

## 2023-06-21 DIAGNOSIS — M19011 Primary osteoarthritis, right shoulder: Secondary | ICD-10-CM

## 2023-06-21 DIAGNOSIS — E785 Hyperlipidemia, unspecified: Secondary | ICD-10-CM

## 2023-06-21 DIAGNOSIS — G8929 Other chronic pain: Secondary | ICD-10-CM

## 2023-06-21 DIAGNOSIS — M4802 Spinal stenosis, cervical region: Secondary | ICD-10-CM

## 2023-06-21 DIAGNOSIS — I10 Essential (primary) hypertension: Secondary | ICD-10-CM

## 2023-06-21 DIAGNOSIS — M25561 Pain in right knee: Secondary | ICD-10-CM

## 2023-06-21 DIAGNOSIS — F17210 Nicotine dependence, cigarettes, uncomplicated: Secondary | ICD-10-CM

## 2023-06-21 DIAGNOSIS — E118 Type 2 diabetes mellitus with unspecified complications: Secondary | ICD-10-CM

## 2023-06-21 MED ORDER — CLOPIDOGREL BISULFATE 75 MG PO TABS
75.0000 mg | ORAL_TABLET | Freq: Every day | ORAL | 1 refills | Status: DC
  Filled 2023-06-21 – 2023-07-04 (×3): qty 90, 90d supply, fill #0

## 2023-06-21 MED ORDER — GABAPENTIN 300 MG PO CAPS
300.0000 mg | ORAL_CAPSULE | Freq: Three times a day (TID) | ORAL | 3 refills | Status: DC
  Filled 2023-06-21 – 2023-07-04 (×2): qty 30, 10d supply, fill #0
  Filled 2023-07-25: qty 30, 10d supply, fill #1

## 2023-06-21 NOTE — Assessment & Plan Note (Signed)
 Hospital chart reviewed, including discharge summary Medications reconciled and reviewed with the patient in detail

## 2023-06-21 NOTE — Assessment & Plan Note (Signed)
 Right groin puncture site clean and dry, no redness, swelling or bleeding noted Continue atorvastatin 80 mg daily, metoprolol 12.5 mg twice daily, aspirin 81 mg daily, isosorbide mononitrate 30 mg daily, Plavix 75 mg daily

## 2023-06-21 NOTE — Assessment & Plan Note (Addendum)
 Lab Results  Component Value Date   HGBA1C 6.7 (H) 05/10/2023  Continue metformin 1000 mg twice daily Patient counseled on low-carb diet Checking urine microalbumin labs, direct LDL Patient referred for diabetic eye exam

## 2023-06-21 NOTE — Assessment & Plan Note (Signed)
 Encouraged to take Tylenol 500 mg every 6 hours as needed Gabapentin 300 mg 3 times daily refilled

## 2023-06-21 NOTE — Assessment & Plan Note (Signed)
 BP Readings from Last 3 Encounters:  06/21/23 91/61  06/18/23 97/68  06/14/23 122/74    Controlled .  On isosorbide mononitrate 30 mg daily, metoprolol 12.5 mg twice daily, Continue current medications. No changes in management. Discussed DASH diet and dietary sodium restrictions Continue to increase dietary efforts and exercise.

## 2023-06-21 NOTE — Progress Notes (Signed)
 Established Patient Office Visit  Subjective:  Patient ID: [name], male    DOB: Jul 26, 1960  Age: 63 y.o. MRN: [id number]  CC:  Chief Complaint  Patient presents with   Medical Management of Chronic Issues    HPI [name] is a 63 y.o. male  has a past medical history of Arthritis of left knee, Cervical stenosis of spine, Diabetes mellitus without complication (HCC), High cholesterol, Hyperlipidemia LDL goal <70, Primary hypertension (02/07/2022), and Tobacco use.  Patient presents for hospitalization follow-up.  Since his last visit patient has been on admission twice, most recent admission was from 06/15/2023 to 06/18/2023 for NSTEMI.     NSTEMI. Had CABG x 4 on 05/15/2023 and was readmitted for NSTEMI. had a cardiac catheterization on 06/16/2023 which showed some severe disease, medical therapy was recommended with plans to consider PCI if the patient has recurrent angina, , prescribed Imdur 30 mg daily at discharge, continued on Plavix 75 mg daily and aspirin 81 mg daily, Lipitor 80 mg daily, Lopressor 12.5 mg daily.  Patient currently denies chest pain, shortness of breath.   Type 2 diabetes, on metformin 1000 mg twice daily, blood sugar was normal in the office today  Cervical stenosis of spine he complained of chronic neck pain has been out of gabapentin.  Gabapentin refilled  Patient felt nauseous at the time during his visit today he was given some water to drink and he felt better afterwards vital signs with normal.  Patient left the office in a stable condition   Interpretation services provided by medical interpreter Lab Results  Component Value Date   CHOL 145 05/11/2023   HDL 24 (L) 05/11/2023   LDLCALC 104 (H) 05/11/2023   TRIG 85 05/11/2023   CHOLHDL 6.0 05/11/2023    Past Medical History:  Diagnosis Date   Arthritis of left knee    Cervical stenosis of spine    Diabetes mellitus without complication (HCC)    High cholesterol    Hyperlipidemia LDL goal <70     Primary hypertension 02/07/2022   Tobacco use     Past Surgical History:  Procedure Laterality Date   CORONARY ARTERY BYPASS GRAFT N/A 05/15/2023   Procedure: CORONARY ARTERY BYPASS GRAFTING (CABG) TIMES FOUR USING LEFT INTERNAL MAMMARY ARTERY AND ENDOSCOPICALLY HARVESTED RIGHT GREATER SAPHENOUS VEIN;  Surgeon: [name], MD;  Location: [facility] OR;  Service: Open Heart Surgery;  Laterality: N/A;   LEFT HEART CATH AND CORONARY ANGIOGRAPHY N/A 05/11/2023   Procedure: LEFT HEART CATH AND CORONARY ANGIOGRAPHY;  Surgeon: Tonny Bollman, MD;  Location: [facility] INVASIVE CV LAB;  Service: Cardiovascular;  Laterality: N/A;   LEFT HEART CATH AND CORS/GRAFTS ANGIOGRAPHY N/A 06/16/2023   Procedure: LEFT HEART CATH AND CORS/GRAFTS ANGIOGRAPHY;  Surgeon: Yates Decamp, MD;  Location: [facility] INVASIVE CV LAB;  Service: Cardiovascular;  Laterality: N/A;   TEE WITHOUT CARDIOVERSION N/A 05/15/2023   Procedure: TRANSESOPHAGEAL ECHOCARDIOGRAM (TEE);  Surgeon: [name], MD;  Location: [facility] OR;  Service: Open Heart Surgery;  Laterality: N/A;    Family History  Problem Relation Age of Onset   Diabetes Father    Colon cancer Neg Hx    Prostate cancer Neg Hx     Social History   Socioeconomic History   Marital status: Single    Spouse name: Not on file   Number of children: 9   Years of education: Not on file   Highest education level: Not on file  Occupational History   Not on file  Tobacco Use   Smoking status: Former    Types: Cigarettes   Smokeless tobacco: Current  Substance and Sexual Activity   Alcohol use: Yes    Alcohol/week: 2.0 standard drinks of alcohol    Types: 2 Cans of beer per week    Comment: occas   Drug use: Not Currently    Comment: ued to smoke weed   Sexual activity: Not on file  Other Topics Concern   Not on file  Social History Narrative   Lives alone with a room mate .    Social Drivers of Corporate investment banker Strain: Not on file  Food Insecurity: Food  Insecurity Present (06/16/2023)   Hunger Vital Sign    Worried About Running Out of Food in the Last Year: Sometimes true    Ran Out of Food in the Last Year: Never true  Transportation Needs: No Transportation Needs (06/16/2023)   PRAPARE - Administrator, Civil Service (Medical): No    Lack of Transportation (Non-Medical): No  Physical Activity: Not on file  Stress: Not on file  Social Connections: Not on file  Intimate Partner Violence: Not At Risk (06/16/2023)   Humiliation, Afraid, Rape, and Kick questionnaire    Fear of Current or Ex-Partner: No    Emotionally Abused: No    Physically Abused: No    Sexually Abused: No    Outpatient Medications Prior to Visit  Medication Sig Dispense Refill   acetaminophen (TYLENOL) 325 MG tablet Take 2 tablets (650 mg total) by mouth every 6 (six) hours as needed for mild pain (pain score 1-3).     aspirin EC 81 MG tablet Take 1 tablet (81 mg total) by mouth daily. Swallow whole. 90 tablet 3   atorvastatin (LIPITOR) 80 MG tablet Take 1 tablet (80 mg total) by mouth daily. 90 tablet 3   isosorbide mononitrate (IMDUR) 30 MG 24 hr tablet Take 1 tablet (30 mg total) by mouth daily. 30 tablet 2   metFORMIN (GLUCOPHAGE) 1000 MG tablet Take 1 tablet (1,000 mg total) by mouth 2 (two) times daily with a meal. 120 tablet 2   metoprolol tartrate (LOPRESSOR) 25 MG tablet Take 0.5 tablets (12.5 mg total) by mouth 2 (two) times daily. 90 tablet 3   clopidogrel (PLAVIX) 75 MG tablet Take 1 tablet (75 mg total) by mouth daily. 30 tablet 1   oxyCODONE (OXY IR/ROXICODONE) 5 MG immediate release tablet Take 1 tablet (5 mg total) by mouth every 6 (six) hours as needed for severe pain (pain score 7-10). (Patient not taking: Reported on 06/21/2023) 28 tablet 0   gabapentin (NEURONTIN) 300 MG capsule Take 1 capsule (300 mg total) by mouth 3 (three) times daily. (Patient not taking: Reported on 06/21/2023) 90 capsule 3   No facility-administered medications prior to  visit.    No Known Allergies  ROS Review of Systems  Constitutional:  Negative for appetite change, chills, fatigue and fever.  HENT:  Negative for congestion, postnasal drip, rhinorrhea and sneezing.   Respiratory:  Negative for cough, shortness of breath and wheezing.   Cardiovascular:  Negative for chest pain, palpitations and leg swelling.  Gastrointestinal:  Negative for abdominal pain, constipation, nausea and vomiting.  Genitourinary:  Negative for difficulty urinating, dysuria, flank pain and frequency.  Musculoskeletal:  Positive for arthralgias. Negative for back pain, joint swelling and myalgias.       Neck pain  Skin:  Negative for color change, pallor, rash and wound.  Neurological:  Negative for dizziness, facial asymmetry, weakness, numbness and headaches.  Psychiatric/Behavioral:  Negative for behavioral problems, confusion, self-injury and suicidal ideas.       Objective:    Physical Exam Vitals and nursing note reviewed. Exam conducted with a chaperone present.  Constitutional:      General: He is not in acute distress.    Appearance: Normal appearance. He is not ill-appearing, toxic-appearing or diaphoretic.  HENT:     Mouth/Throat:     Mouth: Mucous membranes are moist.     Pharynx: Oropharynx is clear. No oropharyngeal exudate or posterior oropharyngeal erythema.  Eyes:     General: No scleral icterus.       Right eye: No discharge.        Left eye: No discharge.     Extraocular Movements: Extraocular movements intact.     Conjunctiva/sclera: Conjunctivae normal.  Cardiovascular:     Rate and Rhythm: Normal rate and regular rhythm.     Pulses: Normal pulses.     Heart sounds: Normal heart sounds. No murmur heard.    No friction rub. No gallop.     Comments: Right groin puncture site clean and dry no redness swelling or bleeding noted Pulmonary:     Effort: Pulmonary effort is normal. No respiratory distress.     Breath sounds: Normal breath sounds.  No stridor. No wheezing, rhonchi or rales.  Chest:     Chest wall: No tenderness.  Abdominal:     General: There is no distension.     Palpations: Abdomen is soft.     Tenderness: There is no abdominal tenderness. There is no right CVA tenderness, left CVA tenderness or guarding.  Musculoskeletal:        General: No deformity or signs of injury.     Right lower leg: No edema.     Left lower leg: No edema.     Comments: Using a cane for ambulation  Skin:    General: Skin is warm and dry.     Coloration: Skin is not jaundiced or pale.     Findings: No bruising, erythema or lesion.  Neurological:     Mental Status: He is alert and oriented to person, place, and time.     Gait: Gait abnormal.  Psychiatric:        Mood and Affect: Mood normal.        Behavior: Behavior normal.        Thought Content: Thought content normal.        Judgment: Judgment normal.     BP 91/61   Pulse 79   Temp (!) 97 F (36.1 C)   Wt 150 lb (68 kg)   SpO2 100%   BMI 26.57 kg/m  Wt Readings from Last 3 Encounters:  06/21/23 150 lb (68 kg)  06/15/23 152 lb (68.9 kg)  06/14/23 150 lb (68 kg)    Lab Results  Component Value Date   TSH 1.362 05/12/2023   Lab Results  Component Value Date   WBC 7.4 06/18/2023   HGB 11.5 (L) 06/18/2023   HCT 34.9 (L) 06/18/2023   MCV 91.6 06/18/2023   PLT 170 06/18/2023   Lab Results  Component Value Date   NA 137 06/17/2023   K 3.6 06/17/2023   CO2 28 06/17/2023   GLUCOSE 131 (H) 06/17/2023   BUN 13 06/17/2023   CREATININE 0.67 06/17/2023   BILITOT 1.6 (H) 05/10/2023   ALKPHOS 93 05/10/2023   AST 29 05/10/2023  ALT 20 05/10/2023   PROT 8.0 05/10/2023   ALBUMIN 3.3 (L) 05/14/2023   CALCIUM 9.1 06/17/2023   ANIONGAP 7 06/17/2023   EGFR 105 12/16/2022   Lab Results  Component Value Date   CHOL 145 05/11/2023   Lab Results  Component Value Date   HDL 24 (L) 05/11/2023   Lab Results  Component Value Date   LDLCALC 104 (H) 05/11/2023   Lab  Results  Component Value Date   TRIG 85 05/11/2023   Lab Results  Component Value Date   CHOLHDL 6.0 05/11/2023   Lab Results  Component Value Date   HGBA1C 6.7 (H) 05/10/2023      Assessment & Plan:   Problem List Items Addressed This Visit       Cardiovascular and Mediastinum   Primary hypertension   BP Readings from Last 3 Encounters:  06/21/23 91/61  06/18/23 97/68  06/14/23 122/74    Controlled .  On isosorbide mononitrate 30 mg daily, metoprolol 12.5 mg twice daily, Continue current medications. No changes in management. Discussed DASH diet and dietary sodium restrictions Continue to increase dietary efforts and exercise.         NSTEMI (non-ST elevated myocardial infarction) (HCC) - Primary   Right groin puncture site clean and dry, no redness, swelling or bleeding noted Continue atorvastatin 80 mg daily, metoprolol 12.5 mg twice daily, aspirin 81 mg daily, isosorbide mononitrate 30 mg daily, Plavix 75 mg daily      CAD (coronary artery disease)   Continue Plavix 75 mg daily, aspirin 81 mg daily atorvastatin 80 mg daily, metoprolol 12.5 mg daily, isosorbide mononitrate 30 mg daily Encouraged maintain close follow-up with cardiology        Endocrine   Type 2 diabetes mellitus with complication, without long-term current use of insulin (HCC)   Lab Results  Component Value Date   HGBA1C 6.7 (H) 05/10/2023  Continue metformin 1000 mg twice daily Patient counseled on low-carb diet Checking urine microalbumin labs, direct LDL Patient referred for diabetic eye exam      Relevant Orders   Direct LDL   Microalbumin / creatinine urine ratio   Ambulatory referral to Ophthalmology     Musculoskeletal and Integument   Primary osteoarthritis of right shoulder   Encouraged to take Tylenol 500 mg every 6 hours as needed Gabapentin 300 mg 3 times daily refilled        Other   Chronic pain of right knee   Encouraged to take Tylenol 500 mg every 6 hours as  needed Gabapentin 300 mg 3 times daily refilled      Relevant Medications   gabapentin (NEURONTIN) 300 MG capsule   Cervical stenosis of spine   Encouraged to take Tylenol 500 mg every 6 hours as needed Gabapentin 300 mg 3 times daily refilled      Relevant Medications   gabapentin (NEURONTIN) 300 MG capsule   Hyperlipidemia   LDL goal is less than 55 Checking direct LDL Continue atorvastatin 80 mg daily Avoid fatty fried foods Lab Results  Component Value Date   CHOL 145 05/11/2023   HDL 24 (L) 05/11/2023   LDLCALC 104 (H) 05/11/2023   TRIG 85 05/11/2023   CHOLHDL 6.0 05/11/2023         Cigarette smoker   Has quit smoking 5 weeks ago, patient encouraged to continue to avoid smoking cigarettes       S/P CABG x 4   Continue Plavix 75 mg daily, aspirin 81 mg daily  Has upcoming appointment with cardiology      Relevant Medications   clopidogrel (PLAVIX) 75 MG tablet   Hospital discharge follow-up   Hospital chart reviewed, including discharge summary Medications reconciled and reviewed with the patient in detail        Meds ordered this encounter  Medications   gabapentin (NEURONTIN) 300 MG capsule    Sig: Take 1 capsule (300 mg total) by mouth 3 (three) times daily.    Dispense:  30 capsule    Refill:  3   clopidogrel (PLAVIX) 75 MG tablet    Sig: Take 1 tablet (75 mg total) by mouth daily.    Dispense:  90 tablet    Refill:  1    Follow-up: Return in about 2 months (around 08/21/2023).    Donell Beers, FNP

## 2023-06-21 NOTE — Assessment & Plan Note (Signed)
 Continue Plavix 75 mg daily, aspirin 81 mg daily Has upcoming appointment with cardiology

## 2023-06-21 NOTE — Assessment & Plan Note (Signed)
 LDL goal is less than 55 Checking direct LDL Continue atorvastatin 80 mg daily Avoid fatty fried foods Lab Results  Component Value Date   CHOL 145 05/11/2023   HDL 24 (L) 05/11/2023   LDLCALC 104 (H) 05/11/2023   TRIG 85 05/11/2023   CHOLHDL 6.0 05/11/2023

## 2023-06-21 NOTE — Assessment & Plan Note (Signed)
 Continue Plavix 75 mg daily, aspirin 81 mg daily atorvastatin 80 mg daily, metoprolol 12.5 mg daily, isosorbide mononitrate 30 mg daily Encouraged maintain close follow-up with cardiology

## 2023-06-21 NOTE — Patient Instructions (Signed)
 1. Cervical stenosis of spine  - gabapentin (NEURONTIN) 300 MG capsule; Take 1 capsule (300 mg total) by mouth 3 (three) times daily.  Dispense: 30 capsule; Refill: 3  2. Chronic pain of right knee  - gabapentin (NEURONTIN) 300 MG capsule; Take 1 capsule (300 mg total) by mouth 3 (three) times daily.  Dispense: 30 capsule; Refill: 3  It is important that you exercise regularly at least 30 minutes 5 times a week as tolerated  Think about what you will eat, plan ahead. Choose " clean, green, fresh or frozen" over canned, processed or packaged foods which are more sugary, salty and fatty. 70 to 75% of food eaten should be vegetables and fruit. Three meals at set times with snacks allowed between meals, but they must be fruit or vegetables. Aim to eat over a 12 hour period , example 7 am to 7 pm, and STOP after  your last meal of the day. Drink water,generally about 64 ounces per day, no other drink is as healthy. Fruit juice is best enjoyed in a healthy way, by EATING the fruit.  Thanks for choosing [facility] we consider it a privelige to serve you.

## 2023-06-21 NOTE — Assessment & Plan Note (Signed)
 Has quit smoking 5 weeks ago, patient encouraged to continue to avoid smoking cigarettes

## 2023-06-22 LAB — MICROALBUMIN / CREATININE URINE RATIO
Creatinine, Urine: 91.2 mg/dL
Microalb/Creat Ratio: 8 mg/g{creat} (ref 0–29)
Microalbumin, Urine: 7.6 ug/mL

## 2023-06-22 LAB — LDL CHOLESTEROL, DIRECT: LDL Direct: 65 mg/dL (ref 0–99)

## 2023-06-23 ENCOUNTER — Other Ambulatory Visit: Payer: Self-pay | Admitting: Nurse Practitioner

## 2023-06-26 NOTE — Progress Notes (Deleted)
   Cardiology Office Note:    Date:  06/26/2023  ID:  [name], DOB 1961-02-01, MRN [id number] PCP: Donell Beers, FNP  [facility] Providers Cardiologist:  None { Click to update primary MD,subspecialty MD or APP then REFRESH:1}    {Click to Open Review  :1}   Patient Profile:      Coronary artery disease  NSTEMI s/p CABG in 04/2023 (L-LAD, S-Dx, S-RI/OM) NSTEMI in 05/2023 - 2/3 grafts occluded >> Med Rx  LHC 06/16/23: L-LAD patent, S-RI/OM 100, S-Dx 100; LAD 90-95, 80, after D1 100, D1 ost 95 - D1 kinked at insertion sight; OM1 95-99; RI 90; RPAV 80 - Med Rx (consider PCI of OM1 if refractory angina) Ischemic cardiomyopathy >> EF returned to normal  LV Thrombus  TTE 05/12/23: EF 45-50, mild LVH, Gr 1 DD, ant-apical thrombus, apical AK, ant-sept, lat, inf-sept, apical ant, inf HK TTE 06/16/23: EF 55-60, no RWMA, mild LVH, NL RVSF Hypertension  Hyperlipidemia  Diabetes mellitus  Tobacco use  Pre-CABG dopplers 05/12/23: no ICA stenosis bilat         Discussed the use of AI scribe software for clinical note transcription with the patient, who gave verbal consent to proceed.  History of Present Illness [name] is a 63 y.o. male who returns for post hospital follow up. He was admitted 3/27-3/30 with a NSTEMI. Cardiac catheterization demonstrated an occluded S-Dx and occluded S-RI/OM. There was severe disease in the LAD leading into the D1 and the D1 was kinked at the graft insertion site. This is difficult anatomy and not amenable to PCI. There was high grade disease in OM1 which is amenable to PCI. However, there is diffuse disease also involving a distal PL branch. Med Rx was recommended unless he has refractory angina.    ROS-See HPI***    Studies Reviewed:       *** Results    Risk Assessment/Calculations:   {Does this patient have ATRIAL FIBRILLATION?[phone]} No BP recorded.  {Refresh Note OR Click here to enter BP  :1}***       Physical Exam:   VS:   There were no vitals taken for this visit.   Wt Readings from Last 3 Encounters:  06/21/23 150 lb (68 kg)  06/15/23 152 lb (68.9 kg)  06/14/23 150 lb (68 kg)    Physical Exam***     Assessment and Plan:   Assessment & Plan NSTEMI (non-ST elevated myocardial infarction) (HCC)  Primary hypertension  Mixed hyperlipidemia  Assessment and Plan Assessment & Plan    {      :1}   {The patient has an active order for outpatient cardiac rehabilitation.   Please indicate if the patient is ready to start. Do NOT delete this.  It will auto delete.  Refresh note, then sign.              Click here to document readiness and see contraindications.  :1}  Cardiac Rehabilitation Eligibility Assessment    {Are you ordering a CV Procedure (e.g. stress test, cath, DCCV, TEE, etc)?   Press F2        :[id number]}  Dispo:  No follow-ups on file.  Signed, Tereso Newcomer, PA-C

## 2023-06-27 ENCOUNTER — Ambulatory Visit: Payer: MEDICAID | Admitting: Physician Assistant

## 2023-06-27 DIAGNOSIS — I10 Essential (primary) hypertension: Secondary | ICD-10-CM

## 2023-06-27 DIAGNOSIS — E782 Mixed hyperlipidemia: Secondary | ICD-10-CM

## 2023-06-27 DIAGNOSIS — I214 Non-ST elevation (NSTEMI) myocardial infarction: Secondary | ICD-10-CM

## 2023-07-03 ENCOUNTER — Other Ambulatory Visit (HOSPITAL_COMMUNITY): Payer: Self-pay

## 2023-07-04 ENCOUNTER — Other Ambulatory Visit (HOSPITAL_COMMUNITY): Payer: Self-pay

## 2023-07-06 NOTE — Progress Notes (Signed)
 Cardiology Office Note    Date:  07/07/2023  ID:  [name], DOB Jun 27, 1960, MRN [id number] PCP:  Paseda, Folashade R, FNP  Cardiologist:  Lurena MARLA Red, MD  Electrophysiologist:  None   Chief Complaint: Follow up for CAD   History of Present Illness: .    [name] is a 63 y.o. male with visit-pertinent history of coronary artery disease, LV thrombus, hypertension, hyperlipidemia, diabetes mellitus, tobacco use.  In 04/2023 patient presented to ER episode of chest pain and abdominal pain several x 2 days prior to ER visit.  On arrival he was not having any current chest pain or abdominal plain.  Evaluation the ED included EKG that showed sinus rhythm with evidence of anterior MI, age undetermined.  High sensitive troponin level was greater than 3400.  Chest x-ray and CT of the abdomen/pelvis were unrevealing.  Patient was started on heparin  infusion for suspected non-STEMI.  Patient was transferred to [facility] and underwent LHC which demonstrated high-grade stenosis involving the ostium and proximal LAD as well as the first diagonal, left ventriculogram also showed severe inferior apical and periapical hypokinesis with left ventricular ejection fraction of 45% as well as a thrombus in the LV anterior apex.  Patient underwent CABG x 4 utilizing LIMA to LAD, SVG to diagonal and sequential SVG to ramus intermediate and OM on 05/11/2023.  He had no significant postoperative complications.  TTE on 06/16/2023 indicated LVEF 55 to 60%, no RWMA, mild LVH, normal RV SF.  Patient was admitted on 3/27 through 3/30 with a NSTEMI.  Cardiac catheterization demonstrated occluded SVG to diagonal and occluded sequential SVG to RI and OM but patent LIMA to LAD.  Native LAD was severely diseased and disease extends into the moderate to large size D1, however the D1 is kinked at the graft insertion site with extremely difficult anatomy.  Native OM appeared amenable to angioplasty however there was also severe  disease involving a distal moderate to large PL branch.  Medical therapy was recommended with plans to consider PCI if he has recurrent angina.  Patient was started on Isordil  with improvement in symptoms, he was transitioned to Imdur  30 mg daily at discharge and recommended to continue DAPT with aspirin  81 mg daily and Plavix  75 mg daily.  Today he presents for follow-up.  He reports that he is doing very well overall.  He denies any further chest discomfort, he denies shortness of breath, lower extremity edema, orthopnea or PND.  He denies any palpitations, presyncope or syncope.  He reports that he is tolerating all of his medications well, notes that he did run out of Imdur  a few days ago, has not had any chest discomfort.  He reported neck pain today that he notes is chronic and related to history of cervical stenosis, his PCP recently refilled his gabapentin  with some improvement in neck pain and with his peripheral neuropathy.  Labwork independently reviewed: 06/17/2023: Sodium 137, potassium 3.6, creatinine 0.67  ROS: .   Today he denies chest pain, shortness of breath, lower extremity edema, fatigue, palpitations, melena, hematuria, hemoptysis, diaphoresis, weakness, presyncope, syncope, orthopnea, and PND.  All other systems are reviewed and otherwise negative. Studies Reviewed: SABRA    EKG:  EKG is ordered today, personally reviewed, demonstrating  EKG Interpretation Date/Time:  Friday July 07 2023 09:09:17 EDT Ventricular Rate:  72 PR Interval:  142 QRS Duration:  88 QT Interval:  386 QTC Calculation: 422 R Axis:   6  Text Interpretation: Normal  sinus rhythm Septal infarct (cited on or before 17-Jun-2023) Cannot rule out Inferior infarct , age undetermined Nonspecific T wave abnormality When compared with ECG of 17-Jun-2023 07:13, QRS axis Shifted left Confirmed by Abad Manard [phone]) on 07/07/2023 12:42:34 PM   CV Studies: Cardiac studies reviewed are outlined and summarized above.  Otherwise please see EMR for full report. Cardiac Studies & Procedures   ______________________________________________________________________________________________ CARDIAC CATHETERIZATION  CARDIAC CATHETERIZATION 06/16/2023  Conclusion Images from the original result were not included. Cardiac Catheterization 06/16/23: Hemodynamic data: LVEDP 12 mmHg.  There is no pressure gradient across the aortic valve.  Angiographic data: LV: Normal LV systolic function, EF 55 to 60% without regional wall motion abnormality. LM: Large vessel with no significant disease. LCx: It is a moderate caliber vessel.  Gives origin to a moderate-sized OM1 with a high-grade 95 to 99% stenosis.  CX at the termination is moderately diffusely diseased. LAD: Large-caliber vessel however severely diseased all the way from the ostium to the mid segment constituting a 90 to 95% stenosis and a tandem 80% stenosis.  Mid segment gives origin to large D1 which has a ostial 95% stenosis however it is now kinked up due to vein graft insertion.  LAD is occluded after the origin of D1. RI: Was previously moderate-sized vessel now a small vessel with a diffusely diseased proximal 90% stenosis.    Impression and recommendations: NSTEMI secondary to recent occlusion of SVG graft to D1 and sequential OM1 and RI.  LAD is severely diseased all the way from the ostium and disease extends into the moderate to large size D1 however the D1 is kinked at the graft insertion site and extremely difficult anatomy and a long segment disease hence left alone. OM lesion appears to be amenable for angioplasty however patient has diffuse disease also involving distal PL branch which is moderate to large size with a secondary branch, patient's LVEF is normal, we will treat him medically aggressively and if he has recurrence of angina we will have low threshold to proceed with coronary angiography.  Findings Coronary Findings Diagnostic   Dominance: Right  Left Anterior Descending Ost LAD to Prox LAD lesion is 90% stenosed. Mid LAD lesion is 100% stenosed.  First Diagonal Branch 1st Diag lesion is 99% stenosed.  Ramus Intermedius Ramus lesion is 90% stenosed.  Left Circumflex Ost Cx to Prox Cx lesion is 30% stenosed.  First Obtuse Marginal Branch 1st Mrg lesion is 95% stenosed.  Right Coronary Artery Prox RCA lesion is 10% stenosed. Mid RCA lesion is 20% stenosed.  Right Posterior Atrioventricular Artery RPAV lesion is 80% stenosed.  LIMA Graft To Dist LAD  Graft To 1st Diag Origin to Prox Graft lesion is 100% stenosed.  Sequential Graft To 1st Mrg, Ramus Origin lesion before 1st Mrg  is 100% stenosed.  Intervention  No interventions have been documented.   CARDIAC CATHETERIZATION  CARDIAC CATHETERIZATION 05/11/2023  Conclusion 1.  Patent left main with mild diffuse nonobstructive plaquing of up to 30% 2.  Severe ostial and proximal LAD stenosis of 75% with critical LAD stenosis at the first diagonal origin of 95% (lesion involves a large diagonal) 3.  Severe stenosis of a small first OM branch of the circumflex and moderately severe stenosis of the distal circumflex 4.  Patent, dominant RCA with mild nonobstructive plaquing 5.  Moderate segmental LV dysfunction with periapical and inferoapical severe hypokinesis, LVEF estimated at 45%.  Recommendations: Resume IV heparin , obtain cardiac surgical consultation for consideration of CABG.  The patient has significant stenosis involving the ostium of the LAD as well as disease involving the LAD/diagonal bifurcation.  Findings Coronary Findings Diagnostic  Dominance: Right  Left Main Ost LM to Dist LM lesion is 30% stenosed.  Left Anterior Descending Ost LAD to Prox LAD lesion is 75% stenosed. Prox LAD to Mid LAD lesion is 95% stenosed. Mid LAD lesion is 50% stenosed.  First Diagonal Branch 1st Diag lesion is 75% stenosed.  Left  Circumflex Mid Cx to Dist Cx lesion is 70% stenosed.  First Obtuse Marginal Branch 1st Mrg lesion is 85% stenosed.  Right Coronary Artery There is mild diffuse disease throughout the vessel. Dominant vessel, mild nonobstructive plaquing without any significant stenoses.  The PDA and PLA branches are all patent with no significant stenoses.  Right Posterior Descending Artery There is mild disease in the vessel.  Intervention  No interventions have been documented.     ECHOCARDIOGRAM  ECHOCARDIOGRAM COMPLETE 06/16/2023  Narrative ECHOCARDIOGRAM REPORT    Patient Name:   [name]  Date of Exam: 06/16/2023 Medical Rec #:  [id number]  Height:       63.0 in Accession #:    [id number] Weight:       152.0 lb Date of Birth:  02-01-1961  BSA:          1.721 m Patient Age:    62 years   BP:           113/75 mmHg Patient Gender: M          HR:           75 bpm. Exam Location:  Inpatient  Procedure: 2D Echo (Both Spectral and Color Flow Doppler were utilized during procedure).  Indications:    NSTEMI  History:        Patient has prior history of Echocardiogram examinations, most recent 05/12/2023. Prior CABG, PAD; Risk Factors:Diabetes, Hypertension, Dyslipidemia and Current Smoker.  Sonographer:    Tinnie Barefoot RDCS Referring Phys: [id number] PRENTICE FORBES NETTERS   Sonographer Comments: Global longitudinal strain was attempted. IMPRESSIONS   1. Left ventricular ejection fraction, by estimation, is 55 to 60%. The left ventricle has normal function. The left ventricle has no regional wall motion abnormalities. There is mild left ventricular hypertrophy. Left ventricular diastolic parameters were normal. The average left ventricular global longitudinal strain is -10.0 %. The global longitudinal strain is abnormal. 2. Right ventricular systolic function is normal. The right ventricular size is normal. Tricuspid regurgitation signal is inadequate for assessing PA pressure. 3. The  mitral valve is normal in structure. No evidence of mitral valve regurgitation. No evidence of mitral stenosis. 4. The aortic valve is tricuspid. Aortic valve regurgitation is not visualized. No aortic stenosis is present. 5. The inferior vena cava is normal in size with greater than 50% respiratory variability, suggesting right atrial pressure of 3 mmHg.  Comparison(s): A prior study was performed on 05/12/2023. LVEF improved compared to prior study.  FINDINGS Left Ventricle: Left ventricular ejection fraction, by estimation, is 55 to 60%. The left ventricle has normal function. The left ventricle has no regional wall motion abnormalities. The average left ventricular global longitudinal strain is -10.0 %. Strain was performed and the global longitudinal strain is abnormal. The left ventricular internal cavity size was normal in size. There is mild left ventricular hypertrophy. Abnormal (paradoxical) septal motion consistent with post-operative status. Left ventricular diastolic parameters were normal.  Right Ventricle: The right ventricular size is normal. No increase in right ventricular wall  thickness. Right ventricular systolic function is normal. Tricuspid regurgitation signal is inadequate for assessing PA pressure.  Left Atrium: Left atrial size was normal in size.  Right Atrium: Right atrial size was normal in size.  Pericardium: There is no evidence of pericardial effusion.  Mitral Valve: The mitral valve is normal in structure. No evidence of mitral valve regurgitation. No evidence of mitral valve stenosis.  Tricuspid Valve: The tricuspid valve is normal in structure. Tricuspid valve regurgitation is trivial. No evidence of tricuspid stenosis.  Aortic Valve: The aortic valve is tricuspid. Aortic valve regurgitation is not visualized. No aortic stenosis is present.  Pulmonic Valve: The pulmonic valve was grossly normal. Pulmonic valve regurgitation is not visualized. No evidence of  pulmonic stenosis.  Aorta: The aortic root and ascending aorta are structurally normal, with no evidence of dilitation.  Venous: The inferior vena cava is normal in size with greater than 50% respiratory variability, suggesting right atrial pressure of 3 mmHg.  IAS/Shunts: No atrial level shunt detected by color flow Doppler.   LEFT VENTRICLE PLAX 2D LVIDd:         5.20 cm     Diastology LVIDs:         3.40 cm     LV e' medial:    6.20 cm/s LV PW:         1.00 cm     LV E/e' medial:  6.8 LV IVS:        1.10 cm     LV e' lateral:   9.79 cm/s LVOT diam:     2.20 cm     LV E/e' lateral: 4.3 LV SV:         54 LV SV Index:   32          2D Longitudinal Strain LVOT Area:     3.80 cm    2D Strain GLS Avg:     -10.0 %  LV Volumes (MOD) LV vol d, MOD A4C: 75.9 ml LV vol s, MOD A4C: 43.5 ml LV SV MOD A4C:     75.9 ml  RIGHT VENTRICLE             IVC RV Basal diam:  2.80 cm     IVC diam: 1.50 cm RV S prime:     11.10 cm/s TAPSE (M-mode): 1.6 cm  LEFT ATRIUM             Index        RIGHT ATRIUM           Index LA diam:        3.40 cm 1.98 cm/m   RA Area:     14.80 cm LA Vol (A2C):   37.7 ml 21.91 ml/m  RA Volume:   35.90 ml  20.86 ml/m LA Vol (A4C):   33.5 ml 19.47 ml/m LA Biplane Vol: 36.1 ml 20.98 ml/m AORTIC VALVE LVOT Vmax:   85.20 cm/s LVOT Vmean:  53.900 cm/s LVOT VTI:    0.143 m  AORTA Ao Root diam: 3.20 cm Ao Asc diam:  3.40 cm  MITRAL VALVE MV Area (PHT): 4.89 cm    SHUNTS MV Decel Time: 155 msec    Systemic VTI:  0.14 m MV E velocity: 42.30 cm/s  Systemic Diam: 2.20 cm MV A velocity: 47.00 cm/s MV E/A ratio:  0.90  Sunit Tolia Electronically signed by Madonna Large Signature Date/Time: 06/16/2023/1:02:07 PM    Final   TEE  ECHO INTRAOPERATIVE TEE 05/15/2023  Narrative *  INTRAOPERATIVE TRANSESOPHAGEAL REPORT *    Patient Name:   [name]      Date of Exam: 05/15/2023 Medical Rec #:  [id number]      Height:       63.0 in Accession #:    [id number]      Weight:       160.0 lb Date of Birth:  Nov 09, 1960      BSA:          1.76 m Patient Age:    62 years       BP:           103/66 mmHg Patient Gender: M              HR:           62 bpm. Exam Location:  Anesthesiology  Transesophogeal exam was perform intraoperatively during surgical procedure. Patient was closely monitored under general anesthesia during the entirety of examination.  Indications:     CABG Performing Phys: Bernardino Mango MD Diagnosing Phys: Bernardino Mango MD  Complications: No known complications during this procedure. POST-OP IMPRESSIONS Overall, there were no significant changes from pre-bypass. _ Left Ventricle: The left ventricle is unchanged from pre-bypass. _ Right Ventricle: The right ventricle appears unchanged from pre-bypass. _ Aorta: The aorta appears unchanged from pre-bypass. _ Left Atrium: The left atrium appears unchanged from pre-bypass. _ Left Atrial Appendage: The left atrial appendage appears unchanged from pre-bypass. _ Aortic Valve: The aortic valve appears unchanged from pre-bypass. _ Mitral Valve: The mitral valve appears unchanged from pre-bypass. _ Tricuspid Valve: The tricuspid valve appears unchanged from pre-bypass. _ Pulmonic Valve: The pulmonic valve appears unchanged from pre-bypass. _ Interatrial Septum: The interatrial septum appears unchanged from pre-bypass. _ Interventricular Septum: The interventricular septum appears unchanged from pre-bypass. _ Pericardium: The pericardium appears unchanged from pre-bypass.  PRE-OP FINDINGS Left Ventricle: The left ventricle has normal systolic function, with an ejection fraction of 55-60%. The cavity size was normal. There is mildly increased left ventricular wall thickness. There is mild concentric left ventricular hypertrophy.  Right Ventricle: The right ventricle has normal systolic function. The cavity was normal. There is no increase in right ventricular wall thickness.  Left Atrium:  Left atrial size was normal in size. No left atrial/left atrial appendage thrombus was detected.  Right Atrium: Right atrial size was normal in size.  Interatrial Septum: No atrial level shunt detected by color flow Doppler.  Pericardium: There is no evidence of pericardial effusion.  Mitral Valve: The mitral valve is normal in structure. Mitral valve regurgitation is trivial by color flow Doppler.  Tricuspid Valve: The tricuspid valve was normal in structure. Tricuspid valve regurgitation was not visualized by color flow Doppler.  Aortic Valve: The aortic valve is normal in structure. Aortic valve regurgitation was not visualized by color flow Doppler.  Pulmonic Valve: The pulmonic valve was normal in structure. Pulmonic valve regurgitation is not visualized by color flow Doppler.   Aorta: The aortic root, ascending aorta and aortic arch are normal in size and structure.   Bernardino Mango MD Electronically signed by Bernardino Mango MD Signature Date/Time: 05/15/2023/3:54:22 PM    Final        ______________________________________________________________________________________________       Current Reported Medications:.    Current Meds  Medication Sig   acetaminophen  (TYLENOL ) 325 MG tablet Take 2 tablets (650 mg total) by mouth every 6 (six) hours as needed for mild pain (pain score 1-3).   aspirin  EC 81 MG  tablet Take 1 tablet (81 mg total) by mouth daily. Swallow whole.   atorvastatin  (LIPITOR) 80 MG tablet Take 1 tablet (80 mg total) by mouth daily.   gabapentin  (NEURONTIN ) 300 MG capsule Take 1 capsule (300 mg total) by mouth 3 (three) times daily.   metFORMIN  (GLUCOPHAGE ) 1000 MG tablet Take 1 tablet (1,000 mg total) by mouth 2 (two) times daily with a meal.   metoprolol  tartrate (LOPRESSOR ) 25 MG tablet Take 0.5 tablets (12.5 mg total) by mouth 2 (two) times daily.   [DISCONTINUED] clopidogrel  (PLAVIX ) 75 MG tablet Take 1 tablet (75 mg total) by mouth daily.    [DISCONTINUED] isosorbide  mononitrate (IMDUR ) 30 MG 24 hr tablet Take 1 tablet (30 mg total) by mouth daily.    Physical Exam:    VS:  BP 124/76   Pulse 77   Ht 5' 4 (1.626 m)   Wt 153 lb 6.4 oz (69.6 kg)   SpO2 98%   BMI 26.33 kg/m    Wt Readings from Last 3 Encounters:  07/07/23 153 lb 6.4 oz (69.6 kg)  06/21/23 150 lb (68 kg)  06/15/23 152 lb (68.9 kg)    GEN: Well nourished, well developed in no acute distress NECK: No JVD; No carotid bruits CARDIAC: RRR, no murmurs, rubs, gallops, left radial cath site and right femoral site clean and intact without evidence of hematoma RESPIRATORY:  Clear to auscultation without rales, wheezing or rhonchi  ABDOMEN: Soft, non-tender, non-distended EXTREMITIES:  No edema; No acute deformity     Asessement and Plan:.    CAD: Patient with recent NSTEMI s/p CABG x 4 with LIMA to LAD, SVG to diagonal and sequential SVG to RI and OM on 05/15/2023.  Readmitted on 06/16/2023 with NSTEMI after presenting with chest pain with associated numbness in his left fingers.  LHC showed occluded SVG to diagonal occluded sequential SVG to RI and OM but patent LIMA to LAD.  Native LAD was severely diseased and disease extends into the moderate to large size D1, however the D1 is kinked to the graft insertion site with extremely difficult anatomy.  Native OM appeared amenable for angioplasty however there was also severe disease involving a distal moderate to large PL branch.  Medical therapy was recommended. Today he reports that he is doing well, he denies any chest pain, shortness of breath.  He did run out of Imdur  a few days prior to appointment, has not had any chest discomfort, refill provided today. Heart healthy diet and regular cardiovascular exercise encouraged.  Reviewed ED precautions.  Continue DAPT with aspirin  81 mg daily and Plavix  75 mg daily, Lopressor  12.5 mg twice daily and Lipitor 80 mg daily and Imdur  30 mg daily. Patient not started on sublingual as  needed nitroglycerin  given concerns for soft blood pressure and no blood pressure cuff at home.  Hypertension: Blood pressure today 124/76.  Continue current antihypertensive regimen.  Of note patient has not taken Imdur  in the last few days as he ran out of the medication, refill provided.  Encourage patient to monitor his blood pressure at home and notify the office if consistently low or if he becomes dizzy or lightheaded.  Hyperlipidemia: Lipid panel in 04/2023 indicated total cholesterol 145, glycerides 85, HDL 24 and LDL 104.  Lipoprotein a was 209.3.  LDL goal less than 55, check fasting lipid profile and LFTs.  Type 2 diabetes mellitus: Hemoglobin A1c 6.7% in 04/2023.  Patient on metformin .  Monitor managed per PCP.  Language barrier: Spanish  interpretor Hadassah Pardon, was present through entire visit.     Cardiac Rehabilitation Eligibility Assessment  The patient is ready to start cardiac rehabilitation from a cardiac standpoint.    Disposition: F/u with Dr. Wendel on 10/02/23 as scheduled.   Signed, Kourtni Stineman D Wandy Bossler, NP

## 2023-07-07 ENCOUNTER — Encounter: Payer: Self-pay | Admitting: Cardiology

## 2023-07-07 ENCOUNTER — Ambulatory Visit: Payer: Self-pay | Attending: Cardiology | Admitting: Cardiology

## 2023-07-07 ENCOUNTER — Other Ambulatory Visit (HOSPITAL_COMMUNITY): Payer: Self-pay

## 2023-07-07 VITALS — BP 124/76 | HR 77 | Ht 64.0 in | Wt 153.4 lb

## 2023-07-07 DIAGNOSIS — I10 Essential (primary) hypertension: Secondary | ICD-10-CM

## 2023-07-07 DIAGNOSIS — Z758 Other problems related to medical facilities and other health care: Secondary | ICD-10-CM

## 2023-07-07 DIAGNOSIS — Z603 Acculturation difficulty: Secondary | ICD-10-CM

## 2023-07-07 DIAGNOSIS — E1165 Type 2 diabetes mellitus with hyperglycemia: Secondary | ICD-10-CM

## 2023-07-07 DIAGNOSIS — E785 Hyperlipidemia, unspecified: Secondary | ICD-10-CM

## 2023-07-07 DIAGNOSIS — Z951 Presence of aortocoronary bypass graft: Secondary | ICD-10-CM

## 2023-07-07 DIAGNOSIS — I2510 Atherosclerotic heart disease of native coronary artery without angina pectoris: Secondary | ICD-10-CM

## 2023-07-07 LAB — LIPID PANEL
Chol/HDL Ratio: 3.3 ratio (ref 0.0–5.0)
Cholesterol, Total: 98 mg/dL — ABNORMAL LOW (ref 100–199)
HDL: 30 mg/dL — ABNORMAL LOW (ref >39)
LDL Chol Calc (NIH): 53 mg/dL (ref 0–99)
Triglycerides: 69 mg/dL (ref 0–149)
VLDL Cholesterol Cal: 15 mg/dL (ref 5–40)

## 2023-07-07 LAB — HEPATIC FUNCTION PANEL
ALT: 18 IU/L (ref 0–44)
AST: 16 IU/L (ref 0–40)
Albumin: 4.3 g/dL (ref 3.9–4.9)
Alkaline Phosphatase: 158 IU/L — ABNORMAL HIGH (ref 44–121)
Bilirubin Total: 0.7 mg/dL (ref 0.0–1.2)
Bilirubin, Direct: 0.23 mg/dL (ref 0.00–0.40)
Total Protein: 7.1 g/dL (ref 6.0–8.5)

## 2023-07-07 MED ORDER — ISOSORBIDE MONONITRATE ER 30 MG PO TB24
30.0000 mg | ORAL_TABLET | Freq: Every day | ORAL | 3 refills | Status: DC
  Filled 2023-07-07: qty 30, 30d supply, fill #0
  Filled 2023-07-25: qty 30, 30d supply, fill #1

## 2023-07-07 MED ORDER — CLOPIDOGREL BISULFATE 75 MG PO TABS
75.0000 mg | ORAL_TABLET | Freq: Every day | ORAL | 3 refills | Status: DC
  Filled 2023-07-07: qty 90, 90d supply, fill #0
  Filled 2024-01-09: qty 30, 30d supply, fill #0
  Filled 2024-02-23: qty 30, 30d supply, fill #1

## 2023-07-07 NOTE — Patient Instructions (Addendum)
 Medication Instructions:  No changes *If you need a refill on your cardiac medications before your next appointment, please call your pharmacy*  Lab Work: Today we are going to order a fasting lipid panel and LFT's If you have labs (blood work) drawn today and your tests are completely normal, you will receive your results only by: MyChart Message (if you have MyChart) OR A paper copy in the mail If you have any lab test that is abnormal or we need to change your treatment, we will call you to review the results.  Testing/Procedures: No testing  Follow-Up: At [facility], you and your health needs are our priority.  As part of our continuing mission to provide you with exceptional heart care, our providers are all part of one team.  This team includes your primary Cardiologist (physician) and Advanced Practice Providers or APPs (Physician Assistants and Nurse Practitioners) who all work together to provide you with the care you need, when you need it.  Your next appointment:   Already Scheduled  We recommend signing up for the patient portal called "MyChart".  Sign up information is provided on this After Visit Summary.  MyChart is used to connect with patients for Virtual Visits (Telemedicine).  Patients are able to view lab/test results, encounter notes, upcoming appointments, etc.  Non-urgent messages can be sent to your provider as well.   To learn more about what you can do with MyChart, go to [email or web address].   Other Instructions:   1st Floor: - Lobby - Registration  - Pharmacy  - Lab - Cafe  2nd Floor: - PV Lab - Diagnostic Testing (echo, CT, nuclear med)  3rd Floor: - Vacant  4th Floor: - TCTS (cardiothoracic surgery) - AFib Clinic - Structural Heart Clinic - Vascular Surgery  - Vascular Ultrasound  5th Floor: - [facility] (general and EP) - Clinical Pharmacy for coumadin, hypertension, lipid, weight-loss medications, and med  management appointments    Valet parking services will be available as well.

## 2023-07-14 ENCOUNTER — Telehealth (HOSPITAL_BASED_OUTPATIENT_CLINIC_OR_DEPARTMENT_OTHER): Payer: Self-pay | Admitting: Licensed Clinical Social Worker

## 2023-07-14 NOTE — Progress Notes (Signed)
 Heart and Vascular Care Navigation  07/14/2023  [name] 01-28-1961 [id number]  Reason for Referral: self pay, emergency Medicaid pending Patient is participating in a Managed Medicaid Plan:No, self pay only, emergency medicaid pending  Engaged with patient by telephone for initial visit for Heart and Vascular Care Coordination.                                                                                                   Assessment:                 LCSW was able to reach pt at [phone] with assistance of Spanish language interpreters. Had some challenge with connection, the 1st interpreter assisting was [name], #[id number] then assisted by interpreter [name] [phone]. Introduced self, role, reason for call. Confirmed home address, PCP, and no insurance. Pt seems to be aware that he has an emergency Medicaid application pending but otherwise shares he doesn't really understand what the process is and has been throwing away paperwork that he receives in [place] because he doesn't understand it. LCSW encouraged him instead of throwing things away to try and keep them until a family member or friend can assist him with reviewing it who may speak [place]. Otherwise if there is a phone number encouraged him to call and see if there is an option for interpretation. Pt agreeable. I will also send a message to [employer] to ask DSS to send any information to him in [place].                        Pt resides with a roommate, hasn't been able to work so hasn't been contributing to the bills as much as he previously had. He is concerned about costs of food etc as well. LCSW offered to send him information about food pantries and assistance since he is not eligible for full benefits (Medicaid, Medicare or SNAP). We discussed completing [employer] and [employer] forms with [name] once the emergency Medicaid has processed. Pt also may be eligible for assistance with medications through  [facility]- will mail him that and f/u also. Pt is agreeable to me sending these to him to review and for assistance.   No additional questions for me at this time. Will f/u to provide additional assistance as able.  HRT/VAS Care Coordination     Patients Home Cardiology Office [facility]   Outpatient Care Team Social Worker   Social Worker Name: [name], [place], [phone]   Living arrangements for the past 2 months Single Family Home   Lives with: Roommate   Patient Current Insurance Coverage Self-Pay   Patient Has Concern With Paying Medical Bills Yes   Patient Concerns With Medical Bills not employed, self pay,not eligible for Medicare or Medicaid   Medical Bill Referrals: [facility];  currently has pending emergency Medicaid   DME Agency NA   [facility] Agency NA   Current home services DME  rolling walker       Social History:  SDOH Screenings   Food Insecurity: Food Insecurity Present (07/14/2023)  Housing: High Risk (07/14/2023)  Transportation Needs: No Transportation Needs (07/14/2023)  Utilities: Not At Risk (07/14/2023)  Depression (PHQ2-9): Low Risk  (06/21/2023)  Financial Resource Strain: High Risk (07/14/2023)  Tobacco Use: High Risk (07/07/2023)  Health Literacy: Inadequate Health Literacy (07/14/2023)    SDOH Interventions: Financial Resources:  Surveyor, quantity Strain Interventions: Programmer, applications Provided, Artist DSS for financial assistance and Editor, commissioning for [employer] Program  Food Insecurity:  Food Insecurity Interventions: [employer] Provided  Housing Insecurity:  Housing Interventions: Other (Comment) (will f/u with community resources as able)  Transportation:   Transportation Interventions: Intervention Not Indicated     Other Care Navigation Interventions:     Provided Pharmacy assistance resources  Mailed  NCMedAssist application   Follow-up plan:   Pt was mailed information about Haematologist, Artist, food pantry resources and ncmedassist application.

## 2023-07-17 ENCOUNTER — Telehealth: Payer: Self-pay

## 2023-07-17 NOTE — Telephone Encounter (Signed)
 Patient will reach back out to office tomorrow.

## 2023-07-17 NOTE — Telephone Encounter (Signed)
-----   Message from Katlyn D West sent at 07/10/2023  8:58 AM EDT ----- Please let [name] know that his cholesterol is well controlled and at goal, his liver function is normal. Good results! Continue current medications and follow up as planned.

## 2023-07-18 NOTE — Telephone Encounter (Signed)
 Called patient advised of below they verbalized understanding Advised patient of next appointment.

## 2023-07-18 NOTE — Telephone Encounter (Signed)
 Patient is following up to review results.

## 2023-07-18 NOTE — Telephone Encounter (Signed)
 Patient did not answer and there was no voicemail to leave a message.

## 2023-07-24 ENCOUNTER — Telehealth: Payer: Self-pay | Admitting: Licensed Clinical Social Worker

## 2023-07-24 NOTE — Telephone Encounter (Signed)
 H&V Care Navigation CSW Progress Note  Clinical Social Worker contacted patient by phone to f/u on resources mailed for medication assistance and financial assistance. Was able to reach pt with assistance of Spanish language interpreter Monette Angus [phone]. He shares that he currently is at [facility] as his truck wont start- working on finding a friend to assist with getting him home. Shares his roommate moved out so just him now at the house- we will try and connect again tomorrow with interpreter to ensure pt understands resources received and how to complete. No additional questions from pt at this time.  Patient is participating in a Managed Medicaid Plan:  No, self pay only  SDOH Screenings   Food Insecurity: Food Insecurity Present (07/14/2023)  Housing: High Risk (07/14/2023)  Transportation Needs: No Transportation Needs (07/14/2023)  Utilities: Not At Risk (07/14/2023)  Depression (PHQ2-9): Low Risk  (06/21/2023)  Financial Resource Strain: High Risk (07/14/2023)  Tobacco Use: High Risk (07/07/2023)  Health Literacy: Inadequate Health Literacy (07/14/2023)    Nathen Balder, MSW, LCSW Clinical Social Worker II [facility] Heart/Vascular Care Navigation  [phone]- work cell phone (preferred)

## 2023-07-25 ENCOUNTER — Telehealth: Payer: Self-pay | Admitting: Licensed Clinical Social Worker

## 2023-07-25 ENCOUNTER — Other Ambulatory Visit (HOSPITAL_COMMUNITY): Payer: Self-pay

## 2023-07-25 NOTE — Telephone Encounter (Addendum)
 H&V Care Navigation CSW Progress Note  Clinical Social Worker contacted patient by phone to f/u on assistance applications/community resources. LCSW was able to reach pt at [phone] with assistance of Spanish language interpreter Claven Cumming, [phone]. Pt able to make it home with assistance yesterday. I attempted to assist pt with reviewing the patient assistance forms I had sent him- in particular NCMedAssist. Pt states he can't complete any forms because something happened to his wallet and he doesn't have his ID and other cards. He needs international documents to re-apply that he doesn't know how to get. LCSW provided him with the number for [employer] but pt refused to take any information about their walk in hours or their address b/c he states he isn't sure if he has a ride- will call me back if he finds a ride. Attempted to have him take it down so he could go if ride available without delaying by calling back- pt declines.   He states he has several medications that he needs refills on and I attempted to provide him with [facility] number since they are able to provide mail order medications but pt declines stating he knows where to call and he'll find a ride.   Attempted again to provide guidance on NCMedAssist application explaining that if eligible he would be able to get the majority of his medications for free through the mail with that program. Pt will not complete application with me at this time. Let him know I am available in future should he want to complete that application with assistance.   No additional questions at this time. Remain available should pt call me back for more details.   Patient is participating in a Managed Medicaid Plan:  No, self pay only  SDOH Screenings   Food Insecurity: Food Insecurity Present (07/14/2023)  Housing: High Risk (07/14/2023)  Transportation Needs: No Transportation Needs (07/14/2023)  Utilities: Not At Risk (07/14/2023)   Depression (PHQ2-9): Low Risk  (06/21/2023)  Financial Resource Strain: High Risk (07/14/2023)  Tobacco Use: High Risk (07/07/2023)  Health Literacy: Inadequate Health Literacy (07/14/2023)    Nathen Balder, MSW, LCSW Clinical Social Worker II [facility] Heart/Vascular Care Navigation  [phone]- work cell phone (preferred)

## 2023-08-21 ENCOUNTER — Other Ambulatory Visit (HOSPITAL_COMMUNITY): Payer: Self-pay

## 2023-08-21 ENCOUNTER — Encounter: Payer: Self-pay | Admitting: Nurse Practitioner

## 2023-08-21 ENCOUNTER — Ambulatory Visit (INDEPENDENT_AMBULATORY_CARE_PROVIDER_SITE_OTHER): Payer: Self-pay | Admitting: Nurse Practitioner

## 2023-08-21 VITALS — BP 109/63 | HR 80 | Temp 97.2°F | Wt 148.0 lb

## 2023-08-21 DIAGNOSIS — W19XXXA Unspecified fall, initial encounter: Secondary | ICD-10-CM | POA: Insufficient documentation

## 2023-08-21 DIAGNOSIS — M9908 Segmental and somatic dysfunction of rib cage: Secondary | ICD-10-CM | POA: Insufficient documentation

## 2023-08-21 DIAGNOSIS — I2510 Atherosclerotic heart disease of native coronary artery without angina pectoris: Secondary | ICD-10-CM

## 2023-08-21 DIAGNOSIS — I10 Essential (primary) hypertension: Secondary | ICD-10-CM

## 2023-08-21 DIAGNOSIS — E785 Hyperlipidemia, unspecified: Secondary | ICD-10-CM

## 2023-08-21 DIAGNOSIS — M25562 Pain in left knee: Secondary | ICD-10-CM

## 2023-08-21 DIAGNOSIS — M25561 Pain in right knee: Secondary | ICD-10-CM

## 2023-08-21 DIAGNOSIS — E118 Type 2 diabetes mellitus with unspecified complications: Secondary | ICD-10-CM

## 2023-08-21 DIAGNOSIS — D649 Anemia, unspecified: Secondary | ICD-10-CM

## 2023-08-21 DIAGNOSIS — B353 Tinea pedis: Secondary | ICD-10-CM | POA: Insufficient documentation

## 2023-08-21 LAB — POCT GLYCOSYLATED HEMOGLOBIN (HGB A1C): Hemoglobin A1C: 7.5 % — AB (ref 4.0–5.6)

## 2023-08-21 MED ORDER — BLOOD GLUCOSE MONITOR SYSTEM W/DEVICE KIT
1.0000 | PACK | Freq: Three times a day (TID) | 0 refills | Status: AC
  Filled 2023-08-21: qty 1, 30d supply, fill #0

## 2023-08-21 MED ORDER — ACCU-CHEK SOFTCLIX LANCETS MISC
1.0000 | Freq: Three times a day (TID) | 0 refills | Status: AC
  Filled 2023-08-21: qty 100, 30d supply, fill #0

## 2023-08-21 MED ORDER — BLOOD GLUCOSE TEST VI STRP
1.0000 | ORAL_STRIP | Freq: Three times a day (TID) | 0 refills | Status: AC
  Filled 2023-08-21: qty 100, 34d supply, fill #0

## 2023-08-21 MED ORDER — CLOTRIMAZOLE 1 % EX CREA
TOPICAL_CREAM | CUTANEOUS | 0 refills | Status: AC
  Filled 2023-08-21: qty 28, 14d supply, fill #0

## 2023-08-21 MED ORDER — GLIPIZIDE 5 MG PO TABS
2.5000 mg | ORAL_TABLET | Freq: Every day | ORAL | 1 refills | Status: DC
  Filled 2023-08-21: qty 45, 90d supply, fill #0
  Filled 2024-01-09: qty 15, 30d supply, fill #0
  Filled 2024-02-23: qty 15, 30d supply, fill #1

## 2023-08-21 MED ORDER — METFORMIN HCL 1000 MG PO TABS
1000.0000 mg | ORAL_TABLET | Freq: Two times a day (BID) | ORAL | 2 refills | Status: DC
  Filled 2023-08-21: qty 180, 90d supply, fill #0

## 2023-08-21 MED ORDER — LANCET DEVICE MISC
1.0000 | Freq: Three times a day (TID) | 0 refills | Status: AC
  Filled 2023-08-21: qty 1, 30d supply, fill #0

## 2023-08-21 NOTE — Assessment & Plan Note (Signed)
 On gabapentin  300 mg 3 times daily

## 2023-08-21 NOTE — Patient Instructions (Addendum)
 Eye doctor  [facility] [address] [place] [address]  P:  [phone] F:  [phone]  Please consider getting Shingrix, pneumococcal  and Tdap vaccine at local pharmacy.   Goal for fasting blood sugar ranges from 80 to 120 and 2 hours after any meal or at bedtime should be between 130 to 170.   Please take Tylenol  650 mg every 6 hours as needed for pain  Start glipizide 2.5 mg daily for your diabetes, continue metformin  1000 mg twice daily     It is important that you exercise regularly at least 30 minutes 5 times a week as tolerated  Think about what you will eat, plan ahead. Choose " clean, green, fresh or frozen" over canned, processed or packaged foods which are more sugary, salty and fatty. 70 to 75% of food eaten should be vegetables and fruit. Three meals at set times with snacks allowed between meals, but they must be fruit or vegetables. Aim to eat over a 12 hour period , example 7 am to 7 pm, and STOP after  your last meal of the day. Drink water,generally about 64 ounces per day, no other drink is as healthy. Fruit juice is best enjoyed in a healthy way, by EATING the fruit.  Thanks for choosing [facility] we consider it a privelige to serve you.

## 2023-08-21 NOTE — Assessment & Plan Note (Signed)
 Lab Results  Component Value Date   WBC 7.4 06/18/2023   HGB 11.5 (L) 06/18/2023   HCT 34.9 (L) 06/18/2023   MCV 91.6 06/18/2023   PLT 170 06/18/2023  Checking CBC

## 2023-08-21 NOTE — Assessment & Plan Note (Addendum)
 Care of the foot to prevent and manage  tinea pedis discussed - clotrimazole (LOTRIMIN) 1 % cream; Apply 2 times daily between your toes for 2 weeks.  Dispense: 30 g; Refill: 0

## 2023-08-21 NOTE — Assessment & Plan Note (Signed)
 Fall prevention education completed Need to prevent falls also discussed Uses a cane for ambulation

## 2023-08-21 NOTE — Assessment & Plan Note (Signed)
 Lab Results  Component Value Date   HGBA1C 7.5 (A) 08/21/2023  Uncontrolled chronic condition Continue metformin  1000 mg twice daily, start glipizide 2.5 mg daily CBG goals discussed, glucometer ordered Patient counseled on low-carb diet Diabetic foot exam completed, encouraged to get his diabetes eye exam completed Follow-up in 3 months

## 2023-08-21 NOTE — Progress Notes (Signed)
 Established Patient Office Visit  Subjective:  Patient ID: [name], male    DOB: 11/19/1960  Age: 63 y.o. MRN: [id number]  CC:  Chief Complaint  Patient presents with   Medical Management of Chronic Issues   Fall    Right side pain an right rib pain scale 7 with any touch  Both  arms hurt  and left knee is having pain and swelling.     HPI [name] is a 63 y.o. male   has a past medical history of Arthritis of left knee, Cervical stenosis of spine, Diabetes mellitus without complication (HCC), High cholesterol, Hyperlipidemia LDL goal <70, Primary hypertension (02/07/2022), and Tobacco use.  Patient presents for follow-up for his chronic medical conditions  Interpretation services provided by medical interpreter  Fall. Had a fall 3 days ago while going down the stairs at home , a mild pain below the right elbow, right rib cage pain when touch , states that his pain is tolerable. Uses a cane for ambulation.   Type 2 diabetes.  Currently on metformin  1000 mg twice daily. Takes atorvastatin  80 mg daily for hyperlipidemia , does not have a glucometer, confirmed that his diet can be better.   Hypertension.  Currently on metoprolol  12.5 mg twice daily, isosorbide  mononitrate 30 mg daily.  No complaints of chest pain, shortness of breath edema    Lab Results  Component Value Date   HGBA1C 7.5 (A) 08/21/2023     Past Medical History:  Diagnosis Date   Arthritis of left knee    Cervical stenosis of spine    Diabetes mellitus without complication (HCC)    High cholesterol    Hyperlipidemia LDL goal <70    Primary hypertension 02/07/2022   Tobacco use     Past Surgical History:  Procedure Laterality Date   CORONARY ARTERY BYPASS GRAFT N/A 05/15/2023   Procedure: CORONARY ARTERY BYPASS GRAFTING (CABG) TIMES FOUR USING LEFT INTERNAL MAMMARY ARTERY AND ENDOSCOPICALLY HARVESTED RIGHT GREATER SAPHENOUS VEIN;  Surgeon: Zelphia Higashi, MD;  Location: [facility] OR;  Service: Open Heart  Surgery;  Laterality: N/A;   LEFT HEART CATH AND CORONARY ANGIOGRAPHY N/A 05/11/2023   Procedure: LEFT HEART CATH AND CORONARY ANGIOGRAPHY;  Surgeon: Arnoldo Lapping, MD;  Location: [facility] INVASIVE CV LAB;  Service: Cardiovascular;  Laterality: N/A;   LEFT HEART CATH AND CORS/GRAFTS ANGIOGRAPHY N/A 06/16/2023   Procedure: LEFT HEART CATH AND CORS/GRAFTS ANGIOGRAPHY;  Surgeon: Knox Perl, MD;  Location: [facility] INVASIVE CV LAB;  Service: Cardiovascular;  Laterality: N/A;   TEE WITHOUT CARDIOVERSION N/A 05/15/2023   Procedure: TRANSESOPHAGEAL ECHOCARDIOGRAM (TEE);  Surgeon: Zelphia Higashi, MD;  Location: [facility] OR;  Service: Open Heart Surgery;  Laterality: N/A;    Family History  Problem Relation Age of Onset   Diabetes Father    Colon cancer Neg Hx    Prostate cancer Neg Hx     Social History   Socioeconomic History   Marital status: Single    Spouse name: Not on file   Number of children: 9   Years of education: Not on file   Highest education level: Not on file  Occupational History   Not on file  Tobacco Use   Smoking status: Former    Types: Cigarettes   Smokeless tobacco: Current  Substance and Sexual Activity   Alcohol use: Yes    Alcohol/week: 2.0 standard drinks of alcohol    Types: 2 Cans of beer per week    Comment: occas  Drug use: Not Currently    Comment: ued to smoke weed   Sexual activity: Not on file  Other Topics Concern   Not on file  Social History Narrative   Lives alone with a room mate .    Social Drivers of Health   Financial Resource Strain: High Risk (07/14/2023)   Overall Financial Resource Strain (CARDIA)    Difficulty of Paying Living Expenses: Hard  Food Insecurity: Food Insecurity Present (07/14/2023)   Hunger Vital Sign    Worried About Running Out of Food in the Last Year: Sometimes true    Ran Out of Food in the Last Year: Sometimes true  Transportation Needs: No Transportation Needs (07/14/2023)   PRAPARE - Scientist, research (physical sciences) (Medical): No    Lack of Transportation (Non-Medical): No  Physical Activity: Not on file  Stress: Not on file  Social Connections: Not on file  Intimate Partner Violence: Not At Risk (06/16/2023)   Humiliation, Afraid, Rape, and Kick questionnaire    Fear of Current or Ex-Partner: No    Emotionally Abused: No    Physically Abused: No    Sexually Abused: No    Outpatient Medications Prior to Visit  Medication Sig Dispense Refill   acetaminophen  (TYLENOL ) 325 MG tablet Take 2 tablets (650 mg total) by mouth every 6 (six) hours as needed for mild pain (pain score 1-3).     aspirin  EC 81 MG tablet Take 1 tablet (81 mg total) by mouth daily. Swallow whole. 90 tablet 3   atorvastatin  (LIPITOR) 80 MG tablet Take 1 tablet (80 mg total) by mouth daily. 90 tablet 3   clopidogrel  (PLAVIX ) 75 MG tablet Take 1 tablet (75 mg total) by mouth daily. 90 tablet 3   gabapentin  (NEURONTIN ) 300 MG capsule Take 1 capsule (300 mg total) by mouth 3 (three) times daily. 30 capsule 3   isosorbide  mononitrate (IMDUR ) 30 MG 24 hr tablet Take 1 tablet (30 mg total) by mouth daily. 90 tablet 3   metoprolol  tartrate (LOPRESSOR ) 25 MG tablet Take 0.5 tablets (12.5 mg total) by mouth 2 (two) times daily. 90 tablet 3   metFORMIN  (GLUCOPHAGE ) 1000 MG tablet Take 1 tablet (1,000 mg total) by mouth 2 (two) times daily with a meal. 120 tablet 2   oxyCODONE  (OXY IR/ROXICODONE ) 5 MG immediate release tablet Take 1 tablet (5 mg total) by mouth every 6 (six) hours as needed for severe pain (pain score 7-10). (Patient not taking: Reported on 08/21/2023) 28 tablet 0   No facility-administered medications prior to visit.    No Known Allergies  ROS Review of Systems  Constitutional:  Negative for appetite change, chills, fatigue and fever.  HENT:  Negative for congestion, postnasal drip, rhinorrhea and sneezing.   Respiratory:  Negative for cough, shortness of breath and wheezing.   Cardiovascular:  Negative for  chest pain, palpitations and leg swelling.  Gastrointestinal:  Negative for abdominal pain, constipation, nausea and vomiting.  Genitourinary:  Negative for difficulty urinating, dysuria, flank pain and frequency.  Musculoskeletal:  Positive for arthralgias. Negative for joint swelling and myalgias.  Skin:  Positive for wound. Negative for pallor and rash.  Neurological:  Negative for dizziness, facial asymmetry, weakness, numbness and headaches.  Psychiatric/Behavioral:  Negative for behavioral problems, confusion, self-injury and suicidal ideas.       Objective:     Physical Exam Vitals and nursing note reviewed.  Constitutional:      General: He is not in acute  distress.    Appearance: Normal appearance. He is not ill-appearing, toxic-appearing or diaphoretic.  Eyes:     General: No scleral icterus.       Right eye: No discharge.        Left eye: No discharge.     Extraocular Movements: Extraocular movements intact.     Conjunctiva/sclera: Conjunctivae normal.  Cardiovascular:     Rate and Rhythm: Normal rate and regular rhythm.     Pulses: Normal pulses.     Heart sounds: Normal heart sounds. No murmur heard.    No friction rub. No gallop.  Pulmonary:     Effort: Pulmonary effort is normal. No respiratory distress.     Breath sounds: Normal breath sounds. No stridor. No wheezing, rhonchi or rales.  Chest:     Chest wall: No tenderness.  Abdominal:     General: There is no distension.     Palpations: Abdomen is soft.     Tenderness: There is no abdominal tenderness. There is no right CVA tenderness, left CVA tenderness or guarding.  Musculoskeletal:        General: Tenderness and signs of injury present. No swelling.     Right lower leg: No edema.     Left lower leg: No edema.     Comments: Tenderness on palpation of right rib cage area An abrasion noted on right arm below the elbow.  No redness, swelling or drainage noted   Skin:    General: Skin is warm and dry.      Capillary Refill: Capillary refill takes less than 2 seconds.     Coloration: Skin is not jaundiced or pale.     Findings: No bruising, erythema or lesion.     Comments: Maceration in between toes  Neurological:     Mental Status: He is alert and oriented to person, place, and time.     Motor: No weakness.     Gait: Gait abnormal.  Psychiatric:        Mood and Affect: Mood normal.        Behavior: Behavior normal.        Thought Content: Thought content normal.        Judgment: Judgment normal.     BP 109/63   Pulse 80   Temp (!) 97.2 F (36.2 C)   Wt 148 lb (67.1 kg)   SpO2 98%   BMI 25.40 kg/m  Wt Readings from Last 3 Encounters:  08/21/23 148 lb (67.1 kg)  07/07/23 153 lb 6.4 oz (69.6 kg)  06/21/23 150 lb (68 kg)    Lab Results  Component Value Date   TSH 1.362 05/12/2023   Lab Results  Component Value Date   WBC 7.4 06/18/2023   HGB 11.5 (L) 06/18/2023   HCT 34.9 (L) 06/18/2023   MCV 91.6 06/18/2023   PLT 170 06/18/2023   Lab Results  Component Value Date   NA 137 06/17/2023   K 3.6 06/17/2023   CO2 28 06/17/2023   GLUCOSE 131 (H) 06/17/2023   BUN 13 06/17/2023   CREATININE 0.67 06/17/2023   BILITOT 0.7 07/07/2023   ALKPHOS 158 (H) 07/07/2023   AST 16 07/07/2023   ALT 18 07/07/2023   PROT 7.1 07/07/2023   ALBUMIN  4.3 07/07/2023   CALCIUM  9.1 06/17/2023   ANIONGAP 7 06/17/2023   EGFR 105 12/16/2022   Lab Results  Component Value Date   CHOL 98 (L) 07/07/2023   Lab Results  Component Value Date  HDL 30 (L) 07/07/2023   Lab Results  Component Value Date   LDLCALC 53 07/07/2023   Lab Results  Component Value Date   TRIG 69 07/07/2023   Lab Results  Component Value Date   CHOLHDL 3.3 07/07/2023   Lab Results  Component Value Date   HGBA1C 7.5 (A) 08/21/2023      Assessment & Plan:   Problem List Items Addressed This Visit       Cardiovascular and Mediastinum   Primary hypertension (Chronic)   BP Readings from Last 3  Encounters:  08/21/23 109/63  07/07/23 124/76  06/21/23 91/61  HTN Controlled on metoprolol  12.5 mg twice daily, isosorbide  mononitrate 30 mg daily Continue current medications. No changes in management.         CAD (coronary artery disease) (Chronic)   Continue Plavix  75 mg daily, aspirin  81 mg daily, atorvastatin  80 mg daily, metoprolol  12.5 mg twice daily, isosorbide  mononitrate 30 mg daily Maintain close follow-up with cardiology        Endocrine   Type 2 diabetes mellitus with complication, without long-term current use of insulin  (HCC)   Lab Results  Component Value Date   HGBA1C 7.5 (A) 08/21/2023  Uncontrolled chronic condition Continue metformin  1000 mg twice daily, start glipizide 2.5 mg daily CBG goals discussed, glucometer ordered Patient counseled on low-carb diet Diabetic foot exam completed, encouraged to get his diabetes eye exam completed Follow-up in 3 months      Relevant Medications   glipiZIDE (GLUCOTROL) 5 MG tablet   metFORMIN  (GLUCOPHAGE ) 1000 MG tablet   Blood Glucose Monitoring Suppl DEVI   Glucose Blood (BLOOD GLUCOSE TEST STRIPS) STRP   Lancet Device MISC   Lancets Misc. MISC   Other Relevant Orders   POCT glycosylated hemoglobin (Hb A1C) (Completed)     Musculoskeletal and Integument   Tinea pedis of both feet - Primary   Care of the foot to prevent and manage  tinea pedis discussed - clotrimazole (LOTRIMIN) 1 % cream; Apply 2 times daily between your toes for 2 weeks.  Dispense: 30 g; Refill: 0       Relevant Medications   clotrimazole (LOTRIMIN) 1 % cream     Other   Hyperlipidemia   Lab Results  Component Value Date   CHOL 98 (L) 07/07/2023   HDL 30 (L) 07/07/2023   LDLCALC 53 07/07/2023   LDLDIRECT 65 06/21/2023   TRIG 69 07/07/2023   CHOLHDL 3.3 07/07/2023  Well-controlled on atorvastatin  80 mg daily Continue current medication      Arthralgia of both lower legs   On gabapentin  300 mg 3 times daily      Fall   Fall  prevention education completed Need to prevent falls also discussed Uses a cane for ambulation      Anemia   Lab Results  Component Value Date   WBC 7.4 06/18/2023   HGB 11.5 (L) 06/18/2023   HCT 34.9 (L) 06/18/2023   MCV 91.6 06/18/2023   PLT 170 06/18/2023  Checking CBC      Relevant Orders   CBC   Rib cage dysfunction   Patient encouraged to take Tylenol  650 mg every 6 hours as needed Fall prevention education completed Chest x-ray ordered to rule out fracture      Relevant Orders   DG Chest 2 View    Meds ordered this encounter  Medications   clotrimazole (LOTRIMIN) 1 % cream    Sig: Apply 2 times daily between your toes for  2 weeks.    Dispense:  30 g    Refill:  0   glipiZIDE (GLUCOTROL) 5 MG tablet    Sig: Take 0.5 tablets (2.5 mg total) by mouth daily before breakfast.    Dispense:  45 tablet    Refill:  1   metFORMIN  (GLUCOPHAGE ) 1000 MG tablet    Sig: Take 1 tablet (1,000 mg total) by mouth 2 (two) times daily with a meal.    Dispense:  180 tablet    Refill:  2   Blood Glucose Monitoring Suppl DEVI    Sig: 1 each by Does not apply route in the morning, at noon, and at bedtime. May substitute to any manufacturer covered by patient's insurance.    Dispense:  1 each    Refill:  0   Glucose Blood (BLOOD GLUCOSE TEST STRIPS) STRP    Sig: 1 each by In Vitro route in the morning, at noon, and at bedtime. May substitute to any manufacturer covered by patient's insurance.    Dispense:  100 strip    Refill:  0   Lancet Device MISC    Sig: 1 each by Does not apply route in the morning, at noon, and at bedtime. May substitute to any manufacturer covered by patient's insurance.    Dispense:  1 each    Refill:  0   Lancets Misc. MISC    Sig: 1 each by Does not apply route in the morning, at noon, and at bedtime. May substitute to any manufacturer covered by patient's insurance.    Dispense:  100 each    Refill:  0    Follow-up: Return in about 3 months (around  11/21/2023) for HTN, DM.    Kaliya Shreiner R Rachael Zapanta, FNP

## 2023-08-21 NOTE — Assessment & Plan Note (Addendum)
 Patient encouraged to take Tylenol  650 mg every 6 hours as needed Fall prevention education completed Chest x-ray ordered to rule out fracture

## 2023-08-21 NOTE — Assessment & Plan Note (Signed)
 Lab Results  Component Value Date   CHOL 98 (L) 07/07/2023   HDL 30 (L) 07/07/2023   LDLCALC 53 07/07/2023   LDLDIRECT 65 06/21/2023   TRIG 69 07/07/2023   CHOLHDL 3.3 07/07/2023  Well-controlled on atorvastatin  80 mg daily Continue current medication

## 2023-08-21 NOTE — Assessment & Plan Note (Signed)
 Continue Plavix  75 mg daily, aspirin  81 mg daily, atorvastatin  80 mg daily, metoprolol  12.5 mg twice daily, isosorbide  mononitrate 30 mg daily Maintain close follow-up with cardiology

## 2023-08-21 NOTE — Assessment & Plan Note (Signed)
 BP Readings from Last 3 Encounters:  08/21/23 109/63  07/07/23 124/76  06/21/23 91/61  HTN Controlled on metoprolol  12.5 mg twice daily, isosorbide  mononitrate 30 mg daily Continue current medications. No changes in management.

## 2023-08-22 ENCOUNTER — Emergency Department (HOSPITAL_COMMUNITY): Payer: Self-pay

## 2023-08-22 ENCOUNTER — Encounter (HOSPITAL_COMMUNITY): Payer: Self-pay

## 2023-08-22 ENCOUNTER — Ambulatory Visit: Payer: Self-pay | Admitting: Nurse Practitioner

## 2023-08-22 ENCOUNTER — Emergency Department (HOSPITAL_COMMUNITY)
Admission: EM | Admit: 2023-08-22 | Discharge: 2023-08-22 | Disposition: A | Discharge status: Home / Self Care | Payer: Self-pay | Attending: Emergency Medicine | Admitting: Emergency Medicine

## 2023-08-22 DIAGNOSIS — M25521 Pain in right elbow: Secondary | ICD-10-CM | POA: Insufficient documentation

## 2023-08-22 DIAGNOSIS — Z7901 Long term (current) use of anticoagulants: Secondary | ICD-10-CM | POA: Insufficient documentation

## 2023-08-22 DIAGNOSIS — W109XXA Fall (on) (from) unspecified stairs and steps, initial encounter: Secondary | ICD-10-CM | POA: Insufficient documentation

## 2023-08-22 DIAGNOSIS — R0789 Other chest pain: Secondary | ICD-10-CM

## 2023-08-22 DIAGNOSIS — Z7982 Long term (current) use of aspirin: Secondary | ICD-10-CM | POA: Insufficient documentation

## 2023-08-22 DIAGNOSIS — Z7902 Long term (current) use of antithrombotics/antiplatelets: Secondary | ICD-10-CM | POA: Insufficient documentation

## 2023-08-22 DIAGNOSIS — R0781 Pleurodynia: Secondary | ICD-10-CM | POA: Insufficient documentation

## 2023-08-22 DIAGNOSIS — S5001XA Contusion of right elbow, initial encounter: Secondary | ICD-10-CM

## 2023-08-22 LAB — CBC
Hematocrit: 44.4 % (ref 37.5–51.0)
Hemoglobin: 13.9 g/dL (ref 13.0–17.7)
MCH: 28 pg (ref 26.6–33.0)
MCHC: 31.3 g/dL — ABNORMAL LOW (ref 31.5–35.7)
MCV: 90 fL (ref 79–97)
Platelets: 226 10*3/uL (ref 150–450)
RBC: 4.96 x10E6/uL (ref 4.14–5.80)
RDW: 12.9 % (ref 11.6–15.4)
WBC: 6.4 10*3/uL (ref 3.4–10.8)

## 2023-08-22 NOTE — ED Provider Notes (Signed)
 [place] EMERGENCY DEPARTMENT AT [facility] Provider Note   CSN: [id number] Arrival date & time: 08/22/23  1058     History  Chief Complaint  Patient presents with   [name]    [name] is a 63 y.o. male.  This is a 63 year old male who presents after mechanical fall 3 days ago.  He does take Eliquis but did not strike his head.  He had no loss of consciousness.  States he feels fine.  Patient went and saw his family doctor who wanted him come here for x-rays.  Patient denies any cough or hemoptysis.  He has had no focal weakness.  Interpreter used for this encounter       Home Medications Prior to Admission medications   Medication Sig Start Date End Date Taking? Authorizing Provider  Accu-Chek Softclix Lancets lancets Use 1 each in the morning, at noon, and at bedtime. May substitute to any manufacturer covered by patient's insurance. 08/21/23 09/20/23  Paseda, Folashade R, FNP  acetaminophen  (TYLENOL ) 325 MG tablet Take 2 tablets (650 mg total) by mouth every 6 (six) hours as needed for mild pain (pain score 1-3). 05/19/23   Stehler, Barba Bonnet, PA-C  aspirin  EC 81 MG tablet Take 1 tablet (81 mg total) by mouth daily. Swallow whole. 06/18/23   Goodrich, Callie E, PA-C  atorvastatin  (LIPITOR) 80 MG tablet Take 1 tablet (80 mg total) by mouth daily. 06/06/23   Von Grumbling, PA-C  Blood Glucose Monitoring Suppl (BLOOD GLUCOSE MONITOR SYSTEM) w/Device KIT Use in the morning, at noon, and at bedtime to monitor glucose. May substitute to any manufacturer covered by patient's insurance. 08/21/23   Paseda, Folashade R, FNP  clopidogrel  (PLAVIX ) 75 MG tablet Take 1 tablet (75 mg total) by mouth daily. 07/07/23   West, Katlyn D, NP  clotrimazole (LOTRIMIN) 1 % cream Apply 2 times daily between your toes for 2 weeks. 08/21/23   Paseda, Folashade R, FNP  gabapentin  (NEURONTIN ) 300 MG capsule Take 1 capsule (300 mg total) by mouth 3 (three) times daily. 06/21/23 09/19/23  Paseda, Folashade R, FNP   glipiZIDE (GLUCOTROL) 5 MG tablet Take 0.5 tablets (2.5 mg total) by mouth daily before breakfast. 08/21/23   Paseda, Folashade R, FNP  Glucose Blood (BLOOD GLUCOSE TEST STRIPS) STRP Use 1 each  in the morning, at noon, and at bedtime. May substitute to any manufacturer covered by patient's insurance. 08/21/23 09/20/23  Paseda, Folashade R, FNP  isosorbide  mononitrate (IMDUR ) 30 MG 24 hr tablet Take 1 tablet (30 mg total) by mouth daily. 07/07/23   West, Katlyn D, NP  Lancet Device MISC Use 1 each in the morning, at noon, and at bedtime. May substitute to any manufacturer covered by patient's insurance. 08/21/23 09/20/23  Paseda, Folashade R, FNP  metFORMIN  (GLUCOPHAGE ) 1000 MG tablet Take 1 tablet (1,000 mg total) by mouth 2 (two) times daily with a meal. 08/21/23   Paseda, Folashade R, FNP  metoprolol  tartrate (LOPRESSOR ) 25 MG tablet Take 0.5 tablets (12.5 mg total) by mouth 2 (two) times daily. 06/06/23   Von Grumbling, PA-C  oxyCODONE  (OXY IR/ROXICODONE ) 5 MG immediate release tablet Take 1 tablet (5 mg total) by mouth every 6 (six) hours as needed for severe pain (pain score 7-10). Patient not taking: Reported on 08/21/2023 05/19/23   Randa Burton, PA-C      Allergies    Patient has no known allergies.    Review of Systems   Review of Systems  All other systems reviewed and are negative.   Physical Exam Updated Vital Signs BP 130/73 (BP Location: Right Arm)   Pulse 80   Temp 97.8 F (36.6 C) (Oral)   Resp 18   SpO2 99%  Physical Exam Vitals and nursing note reviewed.  Constitutional:      General: He is not in acute distress.    Appearance: Normal appearance. He is well-developed. He is not toxic-appearing.  HENT:     Head: Normocephalic and atraumatic.  Eyes:     General: Lids are normal.     Conjunctiva/sclera: Conjunctivae normal.     Pupils: Pupils are equal, round, and reactive to light.  Neck:     Thyroid: No thyroid mass.     Trachea: No tracheal deviation.   Cardiovascular:     Rate and Rhythm: Normal rate and regular rhythm.     Heart sounds: Normal heart sounds. No murmur heard.    No gallop.  Pulmonary:     Effort: Pulmonary effort is normal. No respiratory distress.     Breath sounds: Normal breath sounds. No stridor. No decreased breath sounds, wheezing, rhonchi or rales.  Chest:    Abdominal:     General: There is no distension.     Palpations: Abdomen is soft.     Tenderness: There is no abdominal tenderness. There is no rebound.  Musculoskeletal:        General: No tenderness. Normal range of motion.     Right elbow: No deformity, effusion or lacerations.     Cervical back: Normal range of motion and neck supple.  Skin:    General: Skin is warm and dry.     Findings: No abrasion or rash.  Neurological:     Mental Status: He is alert and oriented to person, place, and time. Mental status is at baseline.     GCS: GCS eye subscore is 4. GCS verbal subscore is 5. GCS motor subscore is 6.     Cranial Nerves: No cranial nerve deficit.     Sensory: No sensory deficit.     Motor: Motor function is intact.  Psychiatric:        Attention and Perception: Attention normal.        Speech: Speech normal.        Behavior: Behavior normal.     ED Results / Procedures / Treatments   Labs (all labs ordered are listed, but only abnormal results are displayed) Labs Reviewed - No data to display  EKG None  Radiology No results found.  Procedures Procedures    Medications Ordered in ED Medications - No data to display  ED Course/ Medical Decision Making/ A&P                                 Medical Decision Making Amount and/or Complexity of Data Reviewed Radiology: ordered.   X-rays of patient's right chest as well as right elbow negative for fracture.  Will discharge home        Final Clinical Impression(s) / ED Diagnoses Final diagnoses:  None    Rx / DC Orders ED Discharge Orders     None          Lind Repine, MD 08/22/23 1348

## 2023-08-22 NOTE — Discharge Instructions (Addendum)
 Your x-rays today did not show any evidence of fracture

## 2023-08-22 NOTE — ED Triage Notes (Addendum)
 Spanish interpreter used: Pt arrives via POV. Pt reports he tripped and fell  down 3 steps on Saturday. Pt reports pain to right elbow and right side of ribs. PT arrives AxOx[facility]. Pt denies hitting his head, no loc, he does take plavix . Pt saw his pcp yesterday and was recommended to come to the ED to be evaluated. Pt did have recent cardiac surgery in April.

## 2023-08-23 NOTE — Transitions of Care (Post Inpatient/ED Visit) (Signed)
   08/23/2023  Name: [name] MRN: [id number] DOB: [birth year]  Today's TOC FU Call Status: Today's TOC FU Call Status:: Unsuccessful Call (1st Attempt) Unsuccessful Call (1st Attempt) Date: 08/23/23  Attempted to reach the patient regarding the most recent Inpatient/ED visit.  Follow Up Plan: Additional outreach attempts will be made to reach the patient to complete the Transitions of Care (Post Inpatient/ED visit) call.   Signature  [employer], [place]

## 2023-08-28 ENCOUNTER — Telehealth: Payer: Self-pay

## 2023-08-28 ENCOUNTER — Inpatient Hospital Stay: Payer: Self-pay | Admitting: Nurse Practitioner

## 2023-08-28 NOTE — Transitions of Care (Post Inpatient/ED Visit) (Signed)
 08/28/2023  Name: [name] MRN: [id number] DOB: 09/06/1960  Today's TOC FU Call Status:   Patient's Name and Date of Birth confirmed.  Transition Care Management Follow-up Telephone Call Date of Discharge: 08/22/23 Discharge Facility: [facility]) Type of Discharge: Emergency Department How have you been since you were released from the hospital?: Better Any questions or concerns?:  (patient would like to know what was his dx)  Items Reviewed: Did you receive and understand the discharge instructions provided?: Yes Medications obtained,verified, and reconciled?: No Any new allergies since your discharge?: No Dietary orders reviewed?: No Do you have support at home?: No  Medications Reviewed Today: Medications Reviewed Today     Reviewed by Angelita Bares, CMA (Certified Medical Assistant) on 08/28/23 at 0825  Med List Status: <None>   Medication Order Taking? Sig Documenting Provider Last Dose Status Informant  Accu-Chek Softclix Lancets lancets [id number] Yes Use 1 each in the morning, at noon, and at bedtime. May substitute to any manufacturer covered by patient's insurance. Paseda, Folashade R, FNP Taking Active   acetaminophen  (TYLENOL ) 325 MG tablet [id number] Yes Take 2 tablets (650 mg total) by mouth every 6 (six) hours as needed for mild pain (pain score 1-3). Randa Burton, PA-C Taking Active Self, Pharmacy Records  aspirin  EC 81 MG tablet [id number] Yes Take 1 tablet (81 mg total) by mouth daily. Swallow whole. Goodrich, Callie E, PA-C Taking Active   atorvastatin  (LIPITOR) 80 MG tablet [id number] Yes Take 1 tablet (80 mg total) by mouth daily. Von Grumbling, PA-C Taking Active Self, Pharmacy Records  Blood Glucose Monitoring Suppl (BLOOD GLUCOSE MONITOR SYSTEM) w/Device KIT [id number] Yes Use in the morning, at noon, and at bedtime to monitor glucose. May substitute to any manufacturer covered by patient's insurance. Paseda, Folashade R, FNP Taking Active    clopidogrel  (PLAVIX ) 75 MG tablet [id number] Yes Take 1 tablet (75 mg total) by mouth daily. West, Katlyn D, NP Taking Active   clotrimazole  (LOTRIMIN ) 1 % cream [id number] Yes Apply 2 times daily between your toes for 2 weeks. Paseda, Folashade R, FNP Taking Active   gabapentin  (NEURONTIN ) 300 MG capsule [id number] Yes Take 1 capsule (300 mg total) by mouth 3 (three) times daily. Paseda, Folashade R, FNP Taking Active   glipiZIDE  (GLUCOTROL ) 5 MG tablet [id number] Yes Take 0.5 tablets (2.5 mg total) by mouth daily before breakfast. Paseda, Folashade R, FNP Taking Active   Glucose Blood (BLOOD GLUCOSE TEST STRIPS) STRP [id number] Yes Use 1 each  in the morning, at noon, and at bedtime. May substitute to any manufacturer covered by patient's insurance. Paseda, Folashade R, FNP Taking Active   isosorbide  mononitrate (IMDUR ) 30 MG 24 hr tablet [id number] Yes Take 1 tablet (30 mg total) by mouth daily. West, Katlyn D, NP Taking Active   Lancet Device MISC [id number] Yes Use 1 each in the morning, at noon, and at bedtime. May substitute to any manufacturer covered by patient's insurance. Paseda, Folashade R, FNP Taking Active   metFORMIN  (GLUCOPHAGE ) 1000 MG tablet [id number] Yes Take 1 tablet (1,000 mg total) by mouth 2 (two) times daily with a meal. Paseda, Folashade R, FNP Taking Active   metoprolol  tartrate (LOPRESSOR ) 25 MG tablet [id number] Yes Take 0.5 tablets (12.5 mg total) by mouth 2 (two) times daily. Von Grumbling, PA-C Taking Active Self, Pharmacy Records  oxyCODONE  (OXY IR/ROXICODONE ) 5 MG immediate release tablet [id number] No Take 1 tablet (5 mg total) by mouth every 6 (six) hours as needed for  severe pain (pain score 7-10).  Patient not taking: Reported on 08/21/2023   Randa Burton, PA-C Not Taking Active Self, Pharmacy Records            Home Care and Equipment/Supplies: Were Home Health Services Ordered?: No Any new equipment or medical supplies ordered?: No  Functional  Questionnaire: Do you need assistance with bathing/showering or dressing?: No Do you need assistance with meal preparation?: No Do you need assistance with eating?: No Do you have difficulty maintaining continence: No Do you need assistance with getting out of bed/getting out of a chair/moving?: No Do you have difficulty managing or taking your medications?: No  Follow up appointments reviewed: PCP Follow-up appointment confirmed?: Yes Date of PCP follow-up appointment?: 08/30/23 Specialist Hospital Follow-up appointment confirmed?: NA Do you need transportation to your follow-up appointment?: No Do you understand care options if your condition(s) worsen?: Yes-patient verbalized understanding    SIGNATURE Carmisha Larusso, RMA

## 2023-08-28 NOTE — Telephone Encounter (Signed)
 Copied from CRM [id number]. Topic: General - Other >> Aug 28, 2023  1:49 PM Carlatta H wrote: Reason for CRM: Patient received a call from cma charise//Please call the patient back

## 2023-08-30 ENCOUNTER — Other Ambulatory Visit (HOSPITAL_COMMUNITY): Payer: Self-pay

## 2023-08-30 ENCOUNTER — Encounter: Payer: Self-pay | Admitting: Nurse Practitioner

## 2023-08-30 ENCOUNTER — Ambulatory Visit (INDEPENDENT_AMBULATORY_CARE_PROVIDER_SITE_OTHER): Payer: Self-pay | Admitting: Nurse Practitioner

## 2023-08-30 VITALS — BP 97/64 | HR 80 | Temp 97.5°F | Wt 155.0 lb

## 2023-08-30 DIAGNOSIS — Z1211 Encounter for screening for malignant neoplasm of colon: Secondary | ICD-10-CM

## 2023-08-30 DIAGNOSIS — M25561 Pain in right knee: Secondary | ICD-10-CM

## 2023-08-30 DIAGNOSIS — M25562 Pain in left knee: Secondary | ICD-10-CM

## 2023-08-30 DIAGNOSIS — E785 Hyperlipidemia, unspecified: Secondary | ICD-10-CM

## 2023-08-30 DIAGNOSIS — M4802 Spinal stenosis, cervical region: Secondary | ICD-10-CM

## 2023-08-30 DIAGNOSIS — M9908 Segmental and somatic dysfunction of rib cage: Secondary | ICD-10-CM

## 2023-08-30 DIAGNOSIS — E118 Type 2 diabetes mellitus with unspecified complications: Secondary | ICD-10-CM

## 2023-08-30 DIAGNOSIS — Z9181 History of falling: Secondary | ICD-10-CM | POA: Insufficient documentation

## 2023-08-30 DIAGNOSIS — Z09 Encounter for follow-up examination after completed treatment for conditions other than malignant neoplasm: Secondary | ICD-10-CM

## 2023-08-30 DIAGNOSIS — K5900 Constipation, unspecified: Secondary | ICD-10-CM

## 2023-08-30 DIAGNOSIS — G8929 Other chronic pain: Secondary | ICD-10-CM

## 2023-08-30 DIAGNOSIS — I10 Essential (primary) hypertension: Secondary | ICD-10-CM

## 2023-08-30 MED ORDER — SENNA-DOCUSATE SODIUM 8.6-50 MG PO TABS
2.0000 | ORAL_TABLET | Freq: Every evening | ORAL | 1 refills | Status: DC | PRN
  Filled 2023-08-30: qty 60, 30d supply, fill #0

## 2023-08-30 MED ORDER — GABAPENTIN 300 MG PO CAPS
300.0000 mg | ORAL_CAPSULE | Freq: Three times a day (TID) | ORAL | 3 refills | Status: AC
  Filled 2023-08-30: qty 90, 30d supply, fill #0
  Filled 2024-02-27: qty 90, 30d supply, fill #1

## 2023-08-30 NOTE — Patient Instructions (Addendum)
 [facility] [address] [place] [address]  P:  [phone] F:  [phone]   Please consider getting pneumococcal Shingrix and Tdap vaccine at local pharmacy   1. Cervical stenosis of spine  - gabapentin  (NEURONTIN ) 300 MG capsule; Take 1 capsule (300 mg total) by mouth 3 (three) times daily.  Dispense: 90 capsule; Refill: 3  2. Chronic pain of right knee  - gabapentin  (NEURONTIN ) 300 MG capsule; Take 1 capsule (300 mg total) by mouth 3 (three) times daily.  Dispense: 90 capsule; Refill: 3  3. Chronic pain of left knee (Primary)  - gabapentin  (NEURONTIN ) 300 MG capsule; Take 1 capsule (300 mg total) by mouth 3 (three) times daily.  Dispense: 90 capsule; Refill: 3  4. Screening for colon cancer  - Cologuard  5. Constipation, unspecified constipation type  - sennosides-docusate sodium  (SENOKOT-S) 8.6-50 MG tablet; Take 2 tablets by mouth at bedtime as needed for constipation.  Dispense: 60 tablet; Refill: 1    It is important that you exercise regularly at least 30 minutes 5 times a week as tolerated  Think about what you will eat, plan ahead. Choose  clean, green, fresh or frozen over canned, processed or packaged foods which are more sugary, salty and fatty. 70 to 75% of food eaten should be vegetables and fruit. Three meals at set times with snacks allowed between meals, but they must be fruit or vegetables. Aim to eat over a 12 hour period , example 7 am to 7 pm, and STOP after  your last meal of the day. Drink water,generally about 64 ounces per day, no other drink is as healthy. Fruit juice is best enjoyed in a healthy way, by EATING the fruit.  Thanks for choosing [facility] we consider it a privelige to serve you.

## 2023-08-30 NOTE — Assessment & Plan Note (Signed)
 Continue Tylenol  650 mg every 6 hours as needed Gabapentin  300 mg 3 times daily Follow-up with orthopedics

## 2023-08-30 NOTE — Assessment & Plan Note (Signed)
 Hospital chart reviewed, including discharge summary Medications reconciled and reviewed with the patient in detail

## 2023-08-30 NOTE — Assessment & Plan Note (Addendum)
 Continue Tylenol  650 mg every 6 hours as needed Gabapentin  300 mg 3 times daily Follow-up with orthopedics

## 2023-08-30 NOTE — Assessment & Plan Note (Signed)
 Lab Results  Component Value Date   CHOL 98 (L) 07/07/2023   HDL 30 (L) 07/07/2023   LDLCALC 53 07/07/2023   LDLDIRECT 65 06/21/2023   TRIG 69 07/07/2023   CHOLHDL 3.3 07/07/2023  Controlled on atorvastatin  80 mg daily Continue current medication

## 2023-08-30 NOTE — Progress Notes (Signed)
 Established Patient Office Visit  Subjective:  Patient ID: [name], male    DOB: [birth year]  Age: 63 y.o. MRN: [id number]  CC:  Chief Complaint  Patient presents with   Hospitalization Follow-up    Follow up chest pain. Per pt he fells better.    HPI [name] is a 63 y.o. male  has a past medical history of Arthritis of left knee, Cervical stenosis of spine, Diabetes mellitus without complication (HCC), High cholesterol, Hyperlipidemia LDL goal <70, Primary hypertension (02/07/2022), and Tobacco use.   Patient presents for hospitalization follow-up, patient was seen on 08/22/2023 he had complained of chest wall tenderness after falling 3 days prior to that visit.  Patient was told to go to the radiology department at [facility] long to get an x-ray of the chest done to rule out fractures but he had instead went to the emergency room.  X-ray done at emergency room was negative for fracture.  Today the patient denies chest wall tenderness.  He has been taking home medications as ordered.  He continues to have chronic bilateral knee pain worse on the left when ambulating, he has been evaluated by orthopedics in the past and they think that the patient will benefit from a total knee replacement but he is currently uninsured.    Interpretation services provided by medical interpreter     Past Medical History:  Diagnosis Date   Arthritis of left knee    Cervical stenosis of spine    Diabetes mellitus without complication (HCC)    High cholesterol    Hyperlipidemia LDL goal <70    Primary hypertension 02/07/2022   Tobacco use     Past Surgical History:  Procedure Laterality Date   CORONARY ARTERY BYPASS GRAFT N/A 05/15/2023   Procedure: CORONARY ARTERY BYPASS GRAFTING (CABG) TIMES FOUR USING LEFT INTERNAL MAMMARY ARTERY AND ENDOSCOPICALLY HARVESTED RIGHT GREATER SAPHENOUS VEIN;  Surgeon: Zelphia Higashi, MD;  Location: [facility] OR;  Service: Open Heart Surgery;  Laterality: N/A;   LEFT  HEART CATH AND CORONARY ANGIOGRAPHY N/A 05/11/2023   Procedure: LEFT HEART CATH AND CORONARY ANGIOGRAPHY;  Surgeon: Arnoldo Lapping, MD;  Location: [facility] INVASIVE CV LAB;  Service: Cardiovascular;  Laterality: N/A;   LEFT HEART CATH AND CORS/GRAFTS ANGIOGRAPHY N/A 06/16/2023   Procedure: LEFT HEART CATH AND CORS/GRAFTS ANGIOGRAPHY;  Surgeon: Knox Perl, MD;  Location: [facility] INVASIVE CV LAB;  Service: Cardiovascular;  Laterality: N/A;   TEE WITHOUT CARDIOVERSION N/A 05/15/2023   Procedure: TRANSESOPHAGEAL ECHOCARDIOGRAM (TEE);  Surgeon: Zelphia Higashi, MD;  Location: [facility] OR;  Service: Open Heart Surgery;  Laterality: N/A;    Family History  Problem Relation Age of Onset   Diabetes Father    Colon cancer Neg Hx    Prostate cancer Neg Hx     Social History   Socioeconomic History   Marital status: Single    Spouse name: Not on file   Number of children: 9   Years of education: Not on file   Highest education level: Not on file  Occupational History   Not on file  Tobacco Use   Smoking status: Former    Types: Cigarettes   Smokeless tobacco: Current  Substance and Sexual Activity   Alcohol use: Yes    Alcohol/week: 2.0 standard drinks of alcohol    Types: 2 Cans of beer per week    Comment: occas   Drug use: Not Currently    Comment: ued to smoke weed   Sexual activity: Not  on file  Other Topics Concern   Not on file  Social History Narrative   Lives alone with a room mate .    Social Drivers of Health   Financial Resource Strain: High Risk (07/14/2023)   Overall Financial Resource Strain (CARDIA)    Difficulty of Paying Living Expenses: Hard  Food Insecurity: Food Insecurity Present (07/14/2023)   Hunger Vital Sign    Worried About Running Out of Food in the Last Year: Sometimes true    Ran Out of Food in the Last Year: Sometimes true  Transportation Needs: No Transportation Needs (07/14/2023)   PRAPARE - Administrator, Civil Service (Medical): No    Lack of  Transportation (Non-Medical): No  Physical Activity: Not on file  Stress: Not on file  Social Connections: Not on file  Intimate Partner Violence: Not At Risk (06/16/2023)   Humiliation, Afraid, Rape, and Kick questionnaire    Fear of Current or Ex-Partner: No    Emotionally Abused: No    Physically Abused: No    Sexually Abused: No    Outpatient Medications Prior to Visit  Medication Sig Dispense Refill   Accu-Chek Softclix Lancets lancets Use 1 each in the morning, at noon, and at bedtime. May substitute to any manufacturer covered by patient's insurance. 100 each 0   acetaminophen  (TYLENOL ) 325 MG tablet Take 2 tablets (650 mg total) by mouth every 6 (six) hours as needed for mild pain (pain score 1-3).     aspirin  EC 81 MG tablet Take 1 tablet (81 mg total) by mouth daily. Swallow whole. 90 tablet 3   atorvastatin  (LIPITOR) 80 MG tablet Take 1 tablet (80 mg total) by mouth daily. 90 tablet 3   Blood Glucose Monitoring Suppl (BLOOD GLUCOSE MONITOR SYSTEM) w/Device KIT Use in the morning, at noon, and at bedtime to monitor glucose. May substitute to any manufacturer covered by patient's insurance. 1 kit 0   clopidogrel  (PLAVIX ) 75 MG tablet Take 1 tablet (75 mg total) by mouth daily. 90 tablet 3   glipiZIDE  (GLUCOTROL ) 5 MG tablet Take 0.5 tablets (2.5 mg total) by mouth daily before breakfast. 45 tablet 1   Glucose Blood (BLOOD GLUCOSE TEST STRIPS) STRP Use 1 each  in the morning, at noon, and at bedtime. May substitute to any manufacturer covered by patient's insurance. 100 strip 0   isosorbide  mononitrate (IMDUR ) 30 MG 24 hr tablet Take 1 tablet (30 mg total) by mouth daily. 90 tablet 3   Lancet Device MISC Use 1 each in the morning, at noon, and at bedtime. May substitute to any manufacturer covered by patient's insurance. 1 each 0   metFORMIN  (GLUCOPHAGE ) 1000 MG tablet Take 1 tablet (1,000 mg total) by mouth 2 (two) times daily with a meal. 180 tablet 2   metoprolol  tartrate  (LOPRESSOR ) 25 MG tablet Take 0.5 tablets (12.5 mg total) by mouth 2 (two) times daily. 90 tablet 3   gabapentin  (NEURONTIN ) 300 MG capsule Take 1 capsule (300 mg total) by mouth 3 (three) times daily. 30 capsule 3   clotrimazole  (LOTRIMIN ) 1 % cream Apply 2 times daily between your toes for 2 weeks. (Patient not taking: Reported on 08/30/2023) 30 g 0   oxyCODONE  (OXY IR/ROXICODONE ) 5 MG immediate release tablet Take 1 tablet (5 mg total) by mouth every 6 (six) hours as needed for severe pain (pain score 7-10). (Patient not taking: Reported on 08/21/2023) 28 tablet 0   No facility-administered medications prior to visit.  No Known Allergies  ROS Review of Systems  Constitutional:  Negative for appetite change, chills, fatigue and fever.  HENT:  Negative for congestion, postnasal drip, rhinorrhea and sneezing.   Respiratory:  Negative for cough, shortness of breath and wheezing.   Cardiovascular:  Negative for chest pain, palpitations and leg swelling.  Gastrointestinal:  Negative for abdominal pain, constipation, nausea and vomiting.  Genitourinary:  Negative for difficulty urinating, dysuria, flank pain and frequency.  Musculoskeletal:  Positive for arthralgias. Negative for joint swelling and myalgias.  Skin:  Negative for color change, pallor, rash and wound.  Neurological:  Negative for dizziness, facial asymmetry, weakness, numbness and headaches.  Psychiatric/Behavioral:  Negative for behavioral problems, confusion, self-injury and suicidal ideas.       Objective:     Physical Exam Vitals and nursing note reviewed.  Constitutional:      General: He is not in acute distress.    Appearance: Normal appearance. He is obese. He is not ill-appearing, toxic-appearing or diaphoretic.  Eyes:     General: No scleral icterus.       Right eye: No discharge.        Left eye: No discharge.     Extraocular Movements: Extraocular movements intact.     Conjunctiva/sclera: Conjunctivae  normal.  Cardiovascular:     Rate and Rhythm: Normal rate and regular rhythm.     Pulses: Normal pulses.     Heart sounds: Normal heart sounds. No murmur heard.    No friction rub. No gallop.  Pulmonary:     Effort: Pulmonary effort is normal. No respiratory distress.     Breath sounds: Normal breath sounds. No stridor. No wheezing, rhonchi or rales.  Chest:     Chest wall: No tenderness.  Abdominal:     General: There is no distension.     Palpations: Abdomen is soft.     Tenderness: There is no abdominal tenderness. There is no right CVA tenderness, left CVA tenderness or guarding.  Musculoskeletal:        General: No signs of injury.     Right lower leg: No edema.     Left lower leg: No edema.     Comments: Bilateral knee  Skin:    General: Skin is warm and dry.     Capillary Refill: Capillary refill takes less than 2 seconds.     Coloration: Skin is not jaundiced or pale.     Findings: No bruising, erythema or lesion.  Neurological:     Mental Status: He is alert and oriented to person, place, and time.     Motor: No weakness.     Gait: Gait abnormal.  Psychiatric:        Mood and Affect: Mood normal.        Behavior: Behavior normal.        Thought Content: Thought content normal.        Judgment: Judgment normal.     BP 97/64   Pulse 80   Temp (!) 97.5 F (36.4 C)   Wt 155 lb (70.3 kg)   SpO2 99%   BMI 26.61 kg/m  Wt Readings from Last 3 Encounters:  08/30/23 155 lb (70.3 kg)  08/21/23 148 lb (67.1 kg)  07/07/23 153 lb 6.4 oz (69.6 kg)    Lab Results  Component Value Date   TSH 1.362 05/12/2023   Lab Results  Component Value Date   WBC 6.4 08/21/2023   HGB 13.9 08/21/2023   HCT 44.4  08/21/2023   MCV 90 08/21/2023   PLT 226 08/21/2023   Lab Results  Component Value Date   NA 137 06/17/2023   K 3.6 06/17/2023   CO2 28 06/17/2023   GLUCOSE 131 (H) 06/17/2023   BUN 13 06/17/2023   CREATININE 0.67 06/17/2023   BILITOT 0.7 07/07/2023   ALKPHOS  158 (H) 07/07/2023   AST 16 07/07/2023   ALT 18 07/07/2023   PROT 7.1 07/07/2023   ALBUMIN  4.3 07/07/2023   CALCIUM  9.1 06/17/2023   ANIONGAP 7 06/17/2023   EGFR 105 12/16/2022   Lab Results  Component Value Date   CHOL 98 (L) 07/07/2023   Lab Results  Component Value Date   HDL 30 (L) 07/07/2023   Lab Results  Component Value Date   LDLCALC 53 07/07/2023   Lab Results  Component Value Date   TRIG 69 07/07/2023   Lab Results  Component Value Date   CHOLHDL 3.3 07/07/2023   Lab Results  Component Value Date   HGBA1C 7.5 (A) 08/21/2023      Assessment & Plan:   Problem List Items Addressed This Visit       Cardiovascular and Mediastinum   Primary hypertension (Chronic)   Controlled on metoprolol  12.5 mg twice daily, isosorbide  mononitrate 30 mg daily       08/30/2023    2:28 PM 08/22/2023   11:03 AM 08/21/2023   11:21 AM 07/07/2023    8:33 AM 06/21/2023   11:28 AM 06/18/2023   11:22 AM 06/18/2023    7:29 AM  BP/Weight  Systolic BP 97 130 109 124 91 97 98  Diastolic BP 64 73 63 76 61 68 67  Wt. (Lbs) 155  148 153.4 150    BMI 26.61 kg/m2  25.4 kg/m2 26.33 kg/m2 26.57 kg/m2               Endocrine   Type 2 diabetes mellitus with complication, without long-term current use of insulin  (HCC)   Continue metformin  1000 mg twice daily, glipizide  2.5 mg daily Patient counseled on low-carb diet Encouraged to get diabetes eye exam completed Follow-up as planned         Other   Constipation   Has tried MiraLAX  in the past without improvement Senokot ordered, take 2 tablets at bedtime as needed Increase intake of fiber and drink at least 64 ounces of water daily to maintain hydration      Relevant Medications   sennosides-docusate sodium  (SENOKOT-S) 8.6-50 MG tablet   Chronic pain of right knee   Continue Tylenol  650 mg every 6 hours as needed Gabapentin  300 mg 3 times daily Follow-up with orthopedics      Relevant Medications   gabapentin  (NEURONTIN )  300 MG capsule   Cervical stenosis of spine   Relevant Medications   gabapentin  (NEURONTIN ) 300 MG capsule   Hyperlipidemia   Lab Results  Component Value Date   CHOL 98 (L) 07/07/2023   HDL 30 (L) 07/07/2023   LDLCALC 53 07/07/2023   LDLDIRECT 65 06/21/2023   TRIG 69 07/07/2023   CHOLHDL 3.3 07/07/2023  Controlled on atorvastatin  80 mg daily Continue current medication      Chronic pain of left knee - Primary   Continue Tylenol  650 mg every 6 hours as needed Gabapentin  300 mg 3 times daily Follow-up with orthopedics      Relevant Medications   gabapentin  (NEURONTIN ) 300 MG capsule   Encounter for examination following treatment at hospital   Hospital chart reviewed,  including discharge summary Medications reconciled and reviewed with the patient in detail       Rib cage dysfunction   Currently denies pain Fall prevention education completed      At high risk for falls   For prevention education completed Uses a cane for ambulation      Other Visit Diagnoses       Screening for colon cancer       Relevant Orders   Cologuard       Meds ordered this encounter  Medications   gabapentin  (NEURONTIN ) 300 MG capsule    Sig: Take 1 capsule (300 mg total) by mouth 3 (three) times daily.    Dispense:  90 capsule    Refill:  3   sennosides-docusate sodium  (SENOKOT-S) 8.6-50 MG tablet    Sig: Take 2 tablets by mouth at bedtime as needed for constipation.    Dispense:  60 tablet    Refill:  1    Follow-up: No follow-ups on file.    Jian Hodgman R Arieliz Latino, FNP

## 2023-08-30 NOTE — Assessment & Plan Note (Signed)
 Currently denies pain Fall prevention education completed

## 2023-08-30 NOTE — Assessment & Plan Note (Signed)
 For prevention education completed Uses a cane for ambulation

## 2023-08-30 NOTE — Assessment & Plan Note (Signed)
 Controlled on metoprolol  12.5 mg twice daily, isosorbide  mononitrate 30 mg daily       08/30/2023    2:28 PM 08/22/2023   11:03 AM 08/21/2023   11:21 AM 07/07/2023    8:33 AM 06/21/2023   11:28 AM 06/18/2023   11:22 AM 06/18/2023    7:29 AM  BP/Weight  Systolic BP 97 130 109 124 91 97 98  Diastolic BP 64 73 63 76 61 68 67  Wt. (Lbs) 155  148 153.4 150    BMI 26.61 kg/m2  25.4 kg/m2 26.33 kg/m2 26.57 kg/m2

## 2023-08-30 NOTE — Assessment & Plan Note (Signed)
 Has tried MiraLAX  in the past without improvement Senokot ordered, take 2 tablets at bedtime as needed Increase intake of fiber and drink at least 64 ounces of water daily to maintain hydration

## 2023-08-30 NOTE — Assessment & Plan Note (Signed)
 Continue metformin  1000 mg twice daily, glipizide  2.5 mg daily Patient counseled on low-carb diet Encouraged to get diabetes eye exam completed Follow-up as planned

## 2023-08-31 ENCOUNTER — Other Ambulatory Visit (HOSPITAL_COMMUNITY): Payer: Self-pay

## 2023-09-25 NOTE — Progress Notes (Signed)
 Cardiology Office Note:   Date:  10/02/2023  ID:  [name], DOB [birth year], MRN [id number] PCP:  Paseda, Folashade R, FNP  [facility] Providers Cardiologist:  Wendel Haws, MD Referring MD: Paseda, Folashade R, FNP  Chief Complaint/Reason for Referral:  F/U ACS/CAD ASSESSMENT:    1. ACS (acute coronary syndrome) (HCC)   2. Type 2 diabetes mellitus without complication, without long-term current use of insulin  (HCC)   3. Hypertension associated with diabetes (HCC)   4. Hyperlipidemia associated with type 2 diabetes mellitus (HCC)   5. Aortic atherosclerosis (HCC)   6. Ischemic cardiomyopathy   7. BMI 26.0-26.9,adult     PLAN:   In order of problems listed above: ACS: Status post CABG.  Continue ASA 81mg  daily and plavix  75mg  daily for one year (until Feb 2026) then stop plavix .  Continue atorvastatin   80mg  and Imdur  20mg .  Start PRN NTG. T2DM:  Continue ASA 81mg  daily, atorvastatin  80mg  daily, start Jardiance  10mg  daily.   HTN:  Continue lopressor  12.5mg  BID.  BP is ok, can consider  ARB in future if needed. HL:  LDL was at goal of less than 55; having myalgias, will d/c atorvastatin  and start Crestor  20.  If he cannot tolerate Crestor , will refer to PharmD. Aortic atherosclerosis:  Continue ASA 81mg  and change atorvastatin  to Crestor  as above. ICM:  Now resolved.  Continue lopressor  12.5mg  bid and start Jardiance  10mg . Elevated BMI:  Refer to PharmD for [facility] therapy.    Cardiac Rehabilitation Eligibility Assessment  The patient is ready to start cardiac rehabilitation from a cardiac standpoint.           Dispo:  No follow-ups on file.      Medication Adjustments/Labs and Tests Ordered: Current medicines are reviewed at length with the patient today.  Concerns regarding medicines are outlined above.  The following changes have been made:     Labs/tests ordered: Orders Placed This Encounter  Procedures   AMB Referral to [facility] Pharm-D    Medication  Changes: Meds ordered this encounter  Medications   empagliflozin  (JARDIANCE ) 10 MG TABS tablet    Sig: Take 1 tablet (10 mg total) by mouth daily before breakfast.    Dispense:  90 tablet    Refill:  3   rosuvastatin  (CRESTOR ) 20 MG tablet    Sig: Take 1 tablet (20 mg total) by mouth daily.    Dispense:  90 tablet    Refill:  3   nitroGLYCERIN  (NITROSTAT ) 0.4 MG SL tablet    Sig: Take 1 tablet, under your tongue, while sitting.  If no relief of pain may repeat, one tab every 5 minutes up to 3 tablets total over 15 minutes.  If no relief CALL 911.    Dispense:  25 tablet    Refill:  3    Current medicines are reviewed at length with the patient today.  The patient does not have concerns regarding medicines.  I spent 35 minutes reviewing all clinical data during and prior to this visit including all relevant imaging studies, laboratories, clinical information from other health systems and prior notes from both Cardiology and other specialties, interviewing the patient, conducting a complete physical examination, and coordinating care in order to formulate a comprehensive and personalized evaluation and treatment plan.   History of Present Illness:    FOCUSED PROBLEM LIST:   CAD ACS February 2025 CABG LIMA to LAD, VG to DX, VG to RI to OM ACS March 2025 Cath LIMA to LAD patent,  all VG occluded >> med rx ICM, resolved G1DD, mural thrombus EF 45-50% TTE 2/25 Mild LVH, no valve issues EF 55-60% TTE 3/25 T2DM On metformin  HL Lp(A) 209 Atorvastatin  >> myalgias Aortic atherosclerosis CT A/P 2025 BMI 14 October 2023:  Patient consents to use of AI scribe. The patient returns for routine follow up.  He was last seen by Cardiology in April of this year.  A that time he was doing well and without cardiovascular complaints.  His blood pressure was well controlled.  His LDL was at goal of 53.  He currently has no chest pain and his breathing is good. He has not yet started cardiac  rehabilitation.  He experiences muscle and joint pains, which he associates with atorvastatin , his cholesterol medication. He describes the pain as 'almost too much to bear'.  He has diabetes and experiences nocturnal urinary urgency with a burning sensation. A urine analysis showed no signs of infection. He describes a sensation like a vein running from his side down to his foot, but no varicose veins or leg swelling have been observed.  He mentions a weight gain from 148 to 160 pounds. He is concerned about bruises on his abdomen, which are explained as the sites where chest tubes were placed during surgery.       Current Medications: Current Meds  Medication Sig   acetaminophen  (TYLENOL ) 325 MG tablet Take 2 tablets (650 mg total) by mouth every 6 (six) hours as needed for mild pain (pain score 1-3).   aspirin  EC 81 MG tablet Take 1 tablet (81 mg total) by mouth daily. Swallow whole.   clopidogrel  (PLAVIX ) 75 MG tablet Take 1 tablet (75 mg total) by mouth daily.   empagliflozin  (JARDIANCE ) 10 MG TABS tablet Take 1 tablet (10 mg total) by mouth daily before breakfast.   glipiZIDE  (GLUCOTROL ) 5 MG tablet Take 0.5 tablets (2.5 mg total) by mouth daily before breakfast.   isosorbide  mononitrate (IMDUR ) 30 MG 24 hr tablet Take 1 tablet (30 mg total) by mouth daily.   metFORMIN  (GLUCOPHAGE ) 1000 MG tablet Take 1 tablet (1,000 mg total) by mouth 2 (two) times daily with a meal.   metoprolol  tartrate (LOPRESSOR ) 25 MG tablet Take 0.5 tablets (12.5 mg total) by mouth 2 (two) times daily.   nitroGLYCERIN  (NITROSTAT ) 0.4 MG SL tablet Take 1 tablet, under your tongue, while sitting.  If no relief of pain may repeat, one tab every 5 minutes up to 3 tablets total over 15 minutes.  If no relief CALL 911.   rosuvastatin  (CRESTOR ) 20 MG tablet Take 1 tablet (20 mg total) by mouth daily.   [DISCONTINUED] atorvastatin  (LIPITOR) 80 MG tablet Take 1 tablet (80 mg total) by mouth daily.     Review of  Systems:   Please see the history of present illness.    All other systems reviewed and are negative.     EKGs/Labs/Other Test Reviewed:   EKG:  April 2025 NSR with septal infarction pattern  EKG Interpretation Date/Time:    Ventricular Rate:    PR Interval:    QRS Duration:    QT Interval:    QTC Calculation:   R Axis:      Text Interpretation:           Risk Assessment/Calculations:          Physical Exam:   VS:  BP 96/64   Pulse 84   Resp (!) 97   Ht 5' 2.5 (1.588 m)  Wt 156 lb 9.6 oz (71 kg)   BMI 28.19 kg/m        Wt Readings from Last 3 Encounters:  10/02/23 156 lb 9.6 oz (71 kg)  08/30/23 155 lb (70.3 kg)  08/21/23 148 lb (67.1 kg)      GENERAL:  No apparent distress, AOx3 HEENT:  No carotid bruits, +2 carotid impulses, no scleral icterus CAR: RRR no murmurs, gallops, rubs, or thrills RES:  Clear to auscultation bilaterally ABD:  Soft, nontender, nondistended, positive bowel sounds x 4 VASC:  +2 radial pulses, +2 carotid pulses NEURO:  CN 2-12 grossly intact; motor and sensory grossly intact PSYCH:  No active depression or anxiety EXT:  No edema, ecchymosis, or cyanosis  Signed, Ladye Macnaughton K Anvitha Hutmacher, MD  10/02/2023 3:02 PM    [facility] [address] [place], [place], [place]  [address] Phone: [phone]; Fax: [phone]   Note:  This document was prepared using Dragon voice recognition software and may include unintentional dictation errors.

## 2023-10-02 ENCOUNTER — Telehealth: Payer: Self-pay

## 2023-10-02 ENCOUNTER — Other Ambulatory Visit (HOSPITAL_COMMUNITY): Payer: Self-pay

## 2023-10-02 ENCOUNTER — Encounter: Payer: Self-pay | Admitting: Internal Medicine

## 2023-10-02 ENCOUNTER — Ambulatory Visit: Payer: Self-pay | Attending: Internal Medicine | Admitting: Internal Medicine

## 2023-10-02 VITALS — BP 96/64 | HR 84 | Resp 97 | Ht 62.5 in | Wt 156.6 lb

## 2023-10-02 DIAGNOSIS — E119 Type 2 diabetes mellitus without complications: Secondary | ICD-10-CM

## 2023-10-02 DIAGNOSIS — E1159 Type 2 diabetes mellitus with other circulatory complications: Secondary | ICD-10-CM

## 2023-10-02 DIAGNOSIS — E785 Hyperlipidemia, unspecified: Secondary | ICD-10-CM

## 2023-10-02 DIAGNOSIS — I249 Acute ischemic heart disease, unspecified: Secondary | ICD-10-CM

## 2023-10-02 DIAGNOSIS — I255 Ischemic cardiomyopathy: Secondary | ICD-10-CM

## 2023-10-02 DIAGNOSIS — I700 Atherosclerosis of aorta: Secondary | ICD-10-CM

## 2023-10-02 DIAGNOSIS — I152 Hypertension secondary to endocrine disorders: Secondary | ICD-10-CM

## 2023-10-02 DIAGNOSIS — Z6826 Body mass index (BMI) 26.0-26.9, adult: Secondary | ICD-10-CM

## 2023-10-02 DIAGNOSIS — E1169 Type 2 diabetes mellitus with other specified complication: Secondary | ICD-10-CM

## 2023-10-02 MED ORDER — NITROGLYCERIN 0.4 MG SL SUBL
SUBLINGUAL_TABLET | SUBLINGUAL | 3 refills | Status: AC
  Filled 2023-10-02: qty 25, 9d supply, fill #0
  Filled 2024-02-27: qty 25, 9d supply, fill #1

## 2023-10-02 MED ORDER — ROSUVASTATIN CALCIUM 20 MG PO TABS
20.0000 mg | ORAL_TABLET | Freq: Every day | ORAL | 3 refills | Status: AC
  Filled 2023-10-02: qty 90, 90d supply, fill #0
  Filled 2024-01-09: qty 30, 30d supply, fill #0
  Filled 2024-02-23 – 2024-03-07 (×2): qty 30, 30d supply, fill #1

## 2023-10-02 MED ORDER — EMPAGLIFLOZIN 10 MG PO TABS
10.0000 mg | ORAL_TABLET | Freq: Every day | ORAL | 3 refills | Status: DC
Start: 2023-10-02 — End: 2023-11-27
  Filled 2023-10-02: qty 30, 30d supply, fill #0

## 2023-10-02 MED ORDER — EMPAGLIFLOZIN 10 MG PO TABS: 10.0000 mg | ORAL_TABLET | Freq: Every day | ORAL | 0 refills | Status: DC

## 2023-10-02 NOTE — Telephone Encounter (Signed)
 Medication name/dosage: Samples List: Jardiance  10 mg  Administration instructions: Take one tablet by mouth daily  Reason for samples: Reason for samples: new start  Ordering provider: Dr. Wendel  *Once above information entered, route the phone encounter to CV DIV MAG ST SAMPLES and send Teams message to team member assigned to Samples for the day.

## 2023-10-02 NOTE — Telephone Encounter (Signed)
 RPH at [facility] reached out. Patient uninsured, RPH gave a trial card. PAP application for (JARDIANCE /BICARES)  has been mailed to pt home. I will fax provider pages once I receive pt pages

## 2023-10-02 NOTE — Patient Instructions (Addendum)
 Medication Instructions:  Your physician has recommended you make the following change in your medication:  1-STOP Atorvastatin   2-START Crestor  20 mg by mouth daily 3-START Jardiance  10 mg by mouth daily 4-TAKE nitroglycerin  (NTG)- 1 tabet, under your tongue, while sitting.  If no relief of pain may repeat, one tab every 5 minutes up to 3 tablets total over 15 minutes.  If no relief CALL 911.  If you have dizziness/lightheadness  while taking NTG, stop taking and call 911.     *If you need a refill on your cardiac medications before your next appointment, please call your pharmacy*  Lab Work: If you have labs (blood work) drawn today and your tests are completely normal, you will receive your results only by: MyChart Message (if you have MyChart) OR A paper copy in the mail If you have any lab test that is abnormal or we need to change your treatment, we will call you to review the results.  Testing/Procedures: None ordered today.  Follow-Up: At [facility], you and your health needs are our priority.  As part of our continuing mission to provide you with exceptional heart care, our providers are all part of one team.  This team includes your primary Cardiologist (physician) and Advanced Practice Providers or APPs (Physician Assistants and Nurse Practitioners) who all work together to provide you with the care you need, when you need it.  Your next appointment:   6 month(s)  Provider:   One of our Advanced Practice Providers (APPs): Morse Clause, PA-C  Lamarr Satterfield, NP Miriam Shams, NP  Olivia Pavy, PA-C Josefa Beauvais, NP  Leontine Salen, PA-C Orren Fabry, PA-C  [place], PA-C Ernest Dick, NP  Damien Braver, NP Jon Hails, PA-C  Waddell Donath, PA-C    Dayna Dunn, PA-C  Scott Weaver, PA-C Lum Louis, NP Katlyn West, NP Callie Goodrich, PA-C  Evan Williams, PA-C Sheng Haley, PA-C  Xika Zhao, NP Kathleen Johnson, PA-C   We recommend signing up for the  patient portal called MyChart.  Sign up information is provided on this After Visit Summary.  MyChart is used to connect with patients for Virtual Visits (Telemedicine).  Patients are able to view lab/test results, encounter notes, upcoming appointments, etc.  Non-urgent messages can be sent to your provider as well.   To learn more about what you can do with MyChart, go to [email or web address].   Other Instructions You have been referred to Pharmacist.

## 2023-10-16 NOTE — Telephone Encounter (Signed)
 2ND ATTEMPT PAP: Patient assistance application for Jardiance  through [employer]) has been mailed to pt's home address on file. Provider portion of application will be faxed to provider's office when pt portion is received.

## 2023-11-02 NOTE — Telephone Encounter (Signed)
 Unable to obtain requested information from patient, closing encounter.

## 2023-11-13 NOTE — Progress Notes (Deleted)
 Patient ID: [name]                 DOB: April 09, 1960                    MRN: [id number]     HPI: [name] is a 63 y.o. male patient referred to pharmacy clinic by Dr.Thukkani to initiate GLP1-RA therapy. PMH is significant for ***, and obesity. Most recent BMI  kg/m .  Baseline weight and BMI: *** kg/m  Current weight and BMI: *** kg/m  Current meds that affect weight: ****  *** If diabetic and on insulin /sulfonylurea, can consider reducing dose to reduce risk of hypoglycemia  *** Follow-up visit  Assess % weight loss Assess adverse effects Missed doses  Diet:   Exercise:   Family History:   Social History:   Labs: Lab Results  Component Value Date   HGBA1C 7.5 (A) 08/21/2023    Wt Readings from Last 1 Encounters:  10/02/23 156 lb 9.6 oz (71 kg)    BP Readings from Last 1 Encounters:  10/02/23 96/64   Pulse Readings from Last 1 Encounters:  10/02/23 84       Component Value Date/Time   CHOL 98 (L) 07/07/2023 0926   TRIG 69 07/07/2023 0926   HDL 30 (L) 07/07/2023 0926   CHOLHDL 3.3 07/07/2023 0926   CHOLHDL 6.0 05/11/2023 0551   VLDL 17 05/11/2023 0551   LDLCALC 53 07/07/2023 0926   LDLDIRECT 65 06/21/2023 1226    Past Medical History:  Diagnosis Date   Arthritis of left knee    Cervical stenosis of spine    Diabetes mellitus without complication (HCC)    High cholesterol    Hyperlipidemia LDL goal <70    Primary hypertension 02/07/2022   Tobacco use     Current Outpatient Medications on File Prior to Visit  Medication Sig Dispense Refill   acetaminophen  (TYLENOL ) 325 MG tablet Take 2 tablets (650 mg total) by mouth every 6 (six) hours as needed for mild pain (pain score 1-3).     aspirin  EC 81 MG tablet Take 1 tablet (81 mg total) by mouth daily. Swallow whole. 90 tablet 3   Blood Glucose Monitoring Suppl (BLOOD GLUCOSE MONITOR SYSTEM) w/Device KIT Use in the morning, at noon, and at bedtime to monitor glucose. May substitute to any  manufacturer covered by patient's insurance. 1 kit 0   clopidogrel  (PLAVIX ) 75 MG tablet Take 1 tablet (75 mg total) by mouth daily. 90 tablet 3   clotrimazole  (LOTRIMIN ) 1 % cream Apply 2 times daily between your toes for 2 weeks. (Patient not taking: Reported on 08/30/2023) 30 g 0   empagliflozin  (JARDIANCE ) 10 MG TABS tablet Take 1 tablet (10 mg total) by mouth daily before breakfast. 90 tablet 3   empagliflozin  (JARDIANCE ) 10 MG TABS tablet Take 1 tablet (10 mg total) by mouth daily before breakfast. 28 tablet 0   gabapentin  (NEURONTIN ) 300 MG capsule Take 1 capsule (300 mg total) by mouth 3 (three) times daily. 90 capsule 3   glipiZIDE  (GLUCOTROL ) 5 MG tablet Take 0.5 tablets (2.5 mg total) by mouth daily before breakfast. 45 tablet 1   isosorbide  mononitrate (IMDUR ) 30 MG 24 hr tablet Take 1 tablet (30 mg total) by mouth daily. 90 tablet 3   metFORMIN  (GLUCOPHAGE ) 1000 MG tablet Take 1 tablet (1,000 mg total) by mouth 2 (two) times daily with a meal. 180 tablet 2   metoprolol  tartrate (LOPRESSOR ) 25 MG tablet  Take 0.5 tablets (12.5 mg total) by mouth 2 (two) times daily. 90 tablet 3   nitroGLYCERIN  (NITROSTAT ) 0.4 MG SL tablet Take 1 tablet, under your tongue, while sitting.  If no relief of pain may repeat, one tab every 5 minutes up to 3 tablets total over 15 minutes.  If no relief CALL 911. 25 tablet 3   rosuvastatin  (CRESTOR ) 20 MG tablet Take 1 tablet (20 mg total) by mouth daily. 90 tablet 3   sennosides-docusate sodium  (SENOKOT-S) 8.6-50 MG tablet Take 2 tablets by mouth at bedtime as needed for constipation. 60 tablet 1   No current facility-administered medications on file prior to visit.    Allergies  Allergen Reactions   Atorvastatin  Other (See Comments)     Assessment/Plan:  1. Weight loss - Patient has not met goal of at least 5% of body weight loss with comprehensive lifestyle modifications alone in the past 3-6 months. Pharmacotherapy is appropriate to pursue as  augmentation. Will start*** . Confirmed patient not ***pregnant and no personal or family history of medullary thyroid carcinoma (MTC) or Multiple Endocrine Neoplasia syndrome type 2 (MEN 2). Injection technique reviewed at today's visit.  Advised patient on common side effects including nausea, diarrhea, dyspepsia, decreased appetite, and fatigue. Counseled patient on reducing meal size and how to titrate medication to minimize side effects. Counseled patient to call if intolerable side effects or if experiencing dehydration, abdominal pain, or dizziness. Along with pharmacotherapy, the patient will follow dietary modifications and aim for at least 150 minutes of moderate-intensity exercise per week, plus resistance training twice a week (as recommended by the American Heart Association). This resistance training--such as weightlifting, bodyweight exercises, or using resistance bands, adapted to the patient's ability--will help prevent muscle loss.  Follow up in 1-2 days regarding coverage of *** . If therapy is initiated, phone follow-ups will be conducted every 4 weeks for dose titration until the patient reaches the effective therapeutic dose and target weight.  Robbi Blanch, Pharm.D [place] [facility] [address], [place], [place] [address] Phone: [phone]; Fax: [phone]

## 2023-11-14 ENCOUNTER — Ambulatory Visit: Payer: Self-pay | Attending: Pharmacist | Admitting: Pharmacist

## 2023-11-24 ENCOUNTER — Other Ambulatory Visit: Payer: Self-pay

## 2023-11-24 ENCOUNTER — Other Ambulatory Visit (HOSPITAL_COMMUNITY): Payer: Self-pay

## 2023-11-24 ENCOUNTER — Ambulatory Visit (INDEPENDENT_AMBULATORY_CARE_PROVIDER_SITE_OTHER): Payer: Self-pay | Admitting: Nurse Practitioner

## 2023-11-24 ENCOUNTER — Telehealth: Payer: Self-pay | Admitting: Nurse Practitioner

## 2023-11-24 ENCOUNTER — Encounter: Payer: Self-pay | Admitting: Nurse Practitioner

## 2023-11-24 VITALS — BP 113/63 | HR 74 | Temp 97.0°F | Wt 156.0 lb

## 2023-11-24 DIAGNOSIS — K629 Disease of anus and rectum, unspecified: Secondary | ICD-10-CM

## 2023-11-24 DIAGNOSIS — G8929 Other chronic pain: Secondary | ICD-10-CM

## 2023-11-24 DIAGNOSIS — K610 Anal abscess: Secondary | ICD-10-CM

## 2023-11-24 DIAGNOSIS — E118 Type 2 diabetes mellitus with unspecified complications: Secondary | ICD-10-CM

## 2023-11-24 DIAGNOSIS — M25561 Pain in right knee: Secondary | ICD-10-CM

## 2023-11-24 DIAGNOSIS — E785 Hyperlipidemia, unspecified: Secondary | ICD-10-CM

## 2023-11-24 DIAGNOSIS — Z5941 Food insecurity: Secondary | ICD-10-CM

## 2023-11-24 DIAGNOSIS — I10 Essential (primary) hypertension: Secondary | ICD-10-CM

## 2023-11-24 LAB — POCT GLYCOSYLATED HEMOGLOBIN (HGB A1C): Hemoglobin A1C: 7 % — AB (ref 4.0–5.6)

## 2023-11-24 MED ORDER — AMOXICILLIN-POT CLAVULANATE 875-125 MG PO TABS
1.0000 | ORAL_TABLET | Freq: Two times a day (BID) | ORAL | 0 refills | Status: DC
  Filled 2023-11-24: qty 14, 7d supply, fill #0

## 2023-11-24 MED ORDER — ISOSORBIDE MONONITRATE ER 30 MG PO TB24
30.0000 mg | ORAL_TABLET | Freq: Every day | ORAL | 3 refills | Status: DC
  Filled 2023-11-24: qty 90, 90d supply, fill #0
  Filled 2024-01-09: qty 30, 30d supply, fill #0
  Filled 2024-02-23: qty 30, 30d supply, fill #1

## 2023-11-24 NOTE — Assessment & Plan Note (Signed)
 BP Readings from Last 3 Encounters:  11/24/23 113/63  10/02/23 96/64  08/30/23 97/64  Currently well-controlled Continue metoprolol  12.5 mg twice daily, isosorbide  mononitrate 30 mg daily

## 2023-11-24 NOTE — Patient Instructions (Addendum)
 Absceso perianal  - Tableta de amoxicilina-clavulnico (AUGMENTIN ) 875-125 mg; Tomar 1 tableta por va oral 2 (dos) veces al [employer]. Dispensacin: 14 tabletas; Resurtido: 0  Llame a la consulta y programe una cita si la herida no cicatriza completamente despus de terminar el tratamiento con antibiticos.  Es importante que haga ejercicio regularmente al menos 30 minutos, 5 veces por semana, segn su tolerancia. Piense en lo que va a comer y planifique con anticipacin. Elija alimentos limpios, verdes, frescos o congelados en lugar de alimentos enlatados, procesados o envasados, que son ms azucarados, salados y Scientist, physiological. Entre el 70% y el 75% de los alimentos consumidos deben ser verduras y frutas. Tres comidas a horarios fijos, con refrigerios permitidos entre comidas, pero deben ser frutas o verduras. Intente comer durante un perodo de 12 horas, por ejemplo, de 7:00 a 19:00, y DETNGASE despus de su ltima comida del da. Renea schmitz, generalmente unos 1,8 litros al da; ninguna otra bebida es tan saludable. El jugo de fruta se disfruta mejor de forma saludable: COMIENDO la fruta!  Kathlynn pound elegir [facility]. Para nosotros es un privilegio servirle.             Perianal abscess  - amoxicillin -clavulanate (AUGMENTIN ) 875-125 MG tablet; Take 1 tablet by mouth 2 (two) times daily.  Dispense: 14 tablet; Refill: 0   Please call the office and schedule an appointment if the wound does not heal completely after completing the antibiotics    It is important that you exercise regularly at least 30 minutes 5 times a week as tolerated  Think about what you will eat, plan ahead. Choose  clean, green, fresh or frozen over canned, processed or packaged foods which are more sugary, salty and fatty. 70 to 75% of food eaten should be vegetables and fruit. Three meals at set times with snacks allowed between meals, but they must be fruit or vegetables. Aim to eat over a 12 hour  period , example 7 am to 7 pm, and STOP after  your last meal of the day. Drink water,generally about 64 ounces per day, no other drink is as healthy. Fruit juice is best enjoyed in a healthy way, by EATING the fruit.  Thanks for choosing [facility] we consider it a privelige to serve you.

## 2023-11-24 NOTE — Assessment & Plan Note (Signed)
 Food from the clinic pantry provided

## 2023-11-24 NOTE — Assessment & Plan Note (Signed)
 Perianal  skin lesion  perianal lesion noted on the right  with History of rectal abscess last year.Augmentin  prescribed, advised to follow-up in the office if lesion does not resolve after completion of antibiotics

## 2023-11-24 NOTE — Telephone Encounter (Signed)
 Hi Lorain Parson would like Mr. Dolby to set up an appt. With you, can you please reach out to him or let me know your availability. Thank you

## 2023-11-24 NOTE — Assessment & Plan Note (Signed)
 Persistent pain in right knee. Financial constraints limit surgical intervention. Continue Tylenol  650 mg every 6 hours as needed, gabapentin  300 mg 3 times daily

## 2023-11-24 NOTE — Progress Notes (Signed)
 Established Patient Office Visit  Subjective:  Patient ID: [name], male    DOB: 1960/09/16  Age: 63 y.o. MRN: [id number]  CC:  Chief Complaint  Patient presents with   Diabetes    HPI   Discussed the use of AI scribe software for clinical note transcription with the patient, who gave verbal consent to proceed.  History of Present Illness [name] is a 63 year old male  has a past medical history of Arthritis of left knee, Cervical stenosis of spine, Diabetes mellitus without complication (HCC), High cholesterol, Hyperlipidemia LDL goal <70, Primary hypertension (02/07/2022), and Tobacco use.  who presents with a painful lesion near his anus  Patient declined the use of  a medical interpreter today  He has had a lesion near his anus for the past three to four weeks, located on both sides. It was previously bleeding but is not currently bleeding. While it does not cause pain, it feels 'touchy'. He associates it with hemorrhoids, although he has not been diagnosed with hemorrhoids before. He recalls having an abscess in the rectal area last year.  He is currently taking Crestor  20 mg daily for hyperlipidemia he was previously on atorvastatin  but was switched to Crestor  due to muscle and joint pain, which he still experiences intermittently.   He also takes Jardiance  10 mg daily for diabetes management, which was started about a month ago by cardiology, they had given him samples of the medication.SABRAHe takes glipizide  2.5 mg daily and metformin  1000 mg twice daily.  His diabetes is managed with an A1c is 7.0 today He does not check his blood sugar regularly as he does not have a glucose meter, can not afford the cost of glucometer, he denies signs of hypoglycemia  He faces financial difficulties, including challenges in affording food and rent. He works at the [employer] to earn money. He does not have insurance.    Assessment & Plan    Past Medical History:  Diagnosis Date    Arthritis of left knee    Cervical stenosis of spine    Diabetes mellitus without complication (HCC)    High cholesterol    Hyperlipidemia LDL goal <70    Primary hypertension 02/07/2022   Tobacco use     Past Surgical History:  Procedure Laterality Date   CORONARY ARTERY BYPASS GRAFT N/A 05/15/2023   Procedure: CORONARY ARTERY BYPASS GRAFTING (CABG) TIMES FOUR USING LEFT INTERNAL MAMMARY ARTERY AND ENDOSCOPICALLY HARVESTED RIGHT GREATER SAPHENOUS VEIN;  Surgeon: Kerrin Elspeth BROCKS, MD;  Location: [facility] OR;  Service: Open Heart Surgery;  Laterality: N/A;   LEFT HEART CATH AND CORONARY ANGIOGRAPHY N/A 05/11/2023   Procedure: LEFT HEART CATH AND CORONARY ANGIOGRAPHY;  Surgeon: Wonda Sharper, MD;  Location: [facility] INVASIVE CV LAB;  Service: Cardiovascular;  Laterality: N/A;   LEFT HEART CATH AND CORS/GRAFTS ANGIOGRAPHY N/A 06/16/2023   Procedure: LEFT HEART CATH AND CORS/GRAFTS ANGIOGRAPHY;  Surgeon: Ladona Heinz, MD;  Location: [facility] INVASIVE CV LAB;  Service: Cardiovascular;  Laterality: N/A;   TEE WITHOUT CARDIOVERSION N/A 05/15/2023   Procedure: TRANSESOPHAGEAL ECHOCARDIOGRAM (TEE);  Surgeon: Kerrin Elspeth BROCKS, MD;  Location: [facility] OR;  Service: Open Heart Surgery;  Laterality: N/A;    Family History  Problem Relation Age of Onset   Diabetes Father    Colon cancer Neg Hx    Prostate cancer Neg Hx     Social History   Socioeconomic History   Marital status: Single    Spouse name: Not on  file   Number of children: 9   Years of education: Not on file   Highest education level: Not on file  Occupational History   Not on file  Tobacco Use   Smoking status: Former    Types: Cigarettes   Smokeless tobacco: Current  Substance and Sexual Activity   Alcohol use: Yes    Alcohol/week: 2.0 standard drinks of alcohol    Types: 2 Cans of beer per week    Comment: occas   Drug use: Not Currently    Comment: ued to smoke weed   Sexual activity: Not on file  Other Topics Concern   Not on file   Social History Narrative   Lives alone with a room mate .    Social Drivers of Health   Financial Resource Strain: High Risk (07/14/2023)   Overall Financial Resource Strain (CARDIA)    Difficulty of Paying Living Expenses: Hard  Food Insecurity: Food Insecurity Present (11/24/2023)   Hunger Vital Sign    Worried About Running Out of Food in the Last Year: Sometimes true    Ran Out of Food in the Last Year: Sometimes true  Transportation Needs: No Transportation Needs (11/24/2023)   PRAPARE - Administrator, Civil Service (Medical): No    Lack of Transportation (Non-Medical): No  Physical Activity: Not on file  Stress: Not on file  Social Connections: Not on file  Intimate Partner Violence: Not At Risk (11/24/2023)   Humiliation, Afraid, Rape, and Kick questionnaire    Fear of Current or Ex-Partner: No    Emotionally Abused: No    Physically Abused: No    Sexually Abused: No    Outpatient Medications Prior to Visit  Medication Sig Dispense Refill   aspirin  EC 81 MG tablet Take 1 tablet (81 mg total) by mouth daily. Swallow whole. 90 tablet 3   clopidogrel  (PLAVIX ) 75 MG tablet Take 1 tablet (75 mg total) by mouth daily. 90 tablet 3   empagliflozin  (JARDIANCE ) 10 MG TABS tablet Take 1 tablet (10 mg total) by mouth daily before breakfast. 90 tablet 3   gabapentin  (NEURONTIN ) 300 MG capsule Take 1 capsule (300 mg total) by mouth 3 (three) times daily. 90 capsule 3   glipiZIDE  (GLUCOTROL ) 5 MG tablet Take 0.5 tablets (2.5 mg total) by mouth daily before breakfast. 45 tablet 1   metFORMIN  (GLUCOPHAGE ) 1000 MG tablet Take 1 tablet (1,000 mg total) by mouth 2 (two) times daily with a meal. 180 tablet 2   metoprolol  tartrate (LOPRESSOR ) 25 MG tablet Take 0.5 tablets (12.5 mg total) by mouth 2 (two) times daily. 90 tablet 3   nitroGLYCERIN  (NITROSTAT ) 0.4 MG SL tablet Take 1 tablet, under your tongue, while sitting.  If no relief of pain may repeat, one tab every 5 minutes up to 3  tablets total over 15 minutes.  If no relief CALL 911. 25 tablet 3   rosuvastatin  (CRESTOR ) 20 MG tablet Take 1 tablet (20 mg total) by mouth daily. 90 tablet 3   sennosides-docusate sodium  (SENOKOT-S) 8.6-50 MG tablet Take 2 tablets by mouth at bedtime as needed for constipation. 60 tablet 1   acetaminophen  (TYLENOL ) 325 MG tablet Take 2 tablets (650 mg total) by mouth every 6 (six) hours as needed for mild pain (pain score 1-3). (Patient not taking: Reported on 11/24/2023)     Blood Glucose Monitoring Suppl (BLOOD GLUCOSE MONITOR SYSTEM) w/Device KIT Use in the morning, at noon, and at bedtime to monitor glucose. May  substitute to any manufacturer covered by patient's insurance. (Patient not taking: Reported on 11/24/2023) 1 kit 0   clotrimazole  (LOTRIMIN ) 1 % cream Apply 2 times daily between your toes for 2 weeks. (Patient not taking: Reported on 11/24/2023) 30 g 0   empagliflozin  (JARDIANCE ) 10 MG TABS tablet Take 1 tablet (10 mg total) by mouth daily before breakfast. 28 tablet 0   isosorbide  mononitrate (IMDUR ) 30 MG 24 hr tablet Take 1 tablet (30 mg total) by mouth daily. 90 tablet 3   No facility-administered medications prior to visit.    Allergies  Allergen Reactions   Atorvastatin  Other (See Comments)    ROS Review of Systems  Constitutional:  Negative for appetite change, chills, fatigue and fever.  HENT:  Negative for congestion, postnasal drip, rhinorrhea and sneezing.   Respiratory:  Negative for cough, shortness of breath and wheezing.   Cardiovascular:  Negative for chest pain, palpitations and leg swelling.  Gastrointestinal:  Negative for abdominal pain, constipation, nausea and vomiting.  Genitourinary:  Negative for difficulty urinating, dysuria, flank pain and frequency.  Musculoskeletal:  Positive for arthralgias and myalgias. Negative for joint swelling.  Skin:  Positive for rash. Negative for color change, pallor and wound.  Neurological:  Negative for facial asymmetry,  weakness, numbness and headaches.  Psychiatric/Behavioral:  Negative for behavioral problems, confusion, self-injury and suicidal ideas.       Objective:    Physical Exam Vitals and nursing note reviewed. Exam conducted with a chaperone present.  Constitutional:      General: He is not in acute distress.    Appearance: Normal appearance. He is not ill-appearing, toxic-appearing or diaphoretic.  Eyes:     General: No scleral icterus.       Right eye: No discharge.        Left eye: No discharge.     Extraocular Movements: Extraocular movements intact.     Conjunctiva/sclera: Conjunctivae normal.  Cardiovascular:     Rate and Rhythm: Normal rate and regular rhythm.     Pulses: Normal pulses.     Heart sounds: Normal heart sounds. No murmur heard.    No friction rub. No gallop.  Pulmonary:     Effort: Pulmonary effort is normal. No respiratory distress.     Breath sounds: Normal breath sounds. No stridor. No wheezing, rhonchi or rales.  Chest:     Chest wall: No tenderness.  Abdominal:     General: There is no distension.     Palpations: Abdomen is soft.     Tenderness: There is no abdominal tenderness. There is no right CVA tenderness, left CVA tenderness or guarding.  Genitourinary:    Comments: Peri anal lesion noted on the right No drainage or redness  noted Musculoskeletal:        General: No swelling or signs of injury.     Right lower leg: No edema.     Left lower leg: No edema.  Skin:    General: Skin is warm and dry.     Capillary Refill: Capillary refill takes less than 2 seconds.     Coloration: Skin is not jaundiced or pale.     Findings: No bruising or erythema.  Neurological:     Mental Status: He is alert and oriented to person, place, and time.     Motor: No weakness.     Gait: Gait abnormal.     Comments: Uses a cane for ambulation  Psychiatric:        Mood and  Affect: Mood normal.        Behavior: Behavior normal.        Thought Content: Thought  content normal.        Judgment: Judgment normal.     BP 113/63   Pulse 74   Temp (!) 97 F (36.1 C)   Wt 156 lb (70.8 kg)   SpO2 100%   BMI 28.08 kg/m  Wt Readings from Last 3 Encounters:  11/24/23 156 lb (70.8 kg)  10/02/23 156 lb 9.6 oz (71 kg)  08/30/23 155 lb (70.3 kg)    Lab Results  Component Value Date   TSH 1.362 05/12/2023   Lab Results  Component Value Date   WBC 6.4 08/21/2023   HGB 13.9 08/21/2023   HCT 44.4 08/21/2023   MCV 90 08/21/2023   PLT 226 08/21/2023   Lab Results  Component Value Date   NA 137 06/17/2023   K 3.6 06/17/2023   CO2 28 06/17/2023   GLUCOSE 131 (H) 06/17/2023   BUN 13 06/17/2023   CREATININE 0.67 06/17/2023   BILITOT 0.7 07/07/2023   ALKPHOS 158 (H) 07/07/2023   AST 16 07/07/2023   ALT 18 07/07/2023   PROT 7.1 07/07/2023   ALBUMIN  4.3 07/07/2023   CALCIUM  9.1 06/17/2023   ANIONGAP 7 06/17/2023   EGFR 105 12/16/2022   Lab Results  Component Value Date   CHOL 98 (L) 07/07/2023   Lab Results  Component Value Date   HDL 30 (L) 07/07/2023   Lab Results  Component Value Date   LDLCALC 53 07/07/2023   Lab Results  Component Value Date   TRIG 69 07/07/2023   Lab Results  Component Value Date   CHOLHDL 3.3 07/07/2023   Lab Results  Component Value Date   HGBA1C 7.0 (A) 11/24/2023      Assessment & Plan:   Problem List Items Addressed This Visit       Cardiovascular and Mediastinum   Primary hypertension (Chronic)   BP Readings from Last 3 Encounters:  11/24/23 113/63  10/02/23 96/64  08/30/23 97/64  Currently well-controlled Continue metoprolol  12.5 mg twice daily, isosorbide  mononitrate 30 mg daily      Relevant Medications   isosorbide  mononitrate (IMDUR ) 30 MG 24 hr tablet   Other Relevant Orders   CMP14+EGFR     Endocrine   Type 2 diabetes mellitus with complication, without long-term current use of insulin  (HCC)   Lab Results  Component Value Date   HGBA1C 7.0 (A) 11/24/2023   Managed  with metformin  1000 mg twice daily, glipizide  2.5 mg daily, and Jardiance  10 mg daily Jardiance  samples provided at cardiology office.  No signs of hypoglycemia reported. Lacks glucose monitoring device due to financial constraints.  - Advise to avoid sugar, sweets, and soda. Will refer to the clinical pharmacist for med management, can possibly discontinue glipizide  and start Jardiance  25 mg daily to reduce risk of hypoglycemia      Relevant Orders   POCT glycosylated hemoglobin (Hb A1C) (Completed)   Direct LDL   AMB Referral [facility] Care Management     Other   Chronic pain of right knee   Persistent pain in right knee. Financial constraints limit surgical intervention. Continue Tylenol  650 mg every 6 hours as needed, gabapentin  300 mg 3 times daily      Hyperlipidemia - Primary   Lab Results  Component Value Date   CHOL 98 (L) 07/07/2023   HDL 30 (L) 07/07/2023   LDLCALC 53 07/07/2023  LDLDIRECT 65 06/21/2023   TRIG 69 07/07/2023   CHOLHDL 3.3 07/07/2023  Not fasting checking direct LDL Continue Crestor  20 mg daily      Relevant Medications   isosorbide  mononitrate (IMDUR ) 30 MG 24 hr tablet   Perianal lesion   Perianal  skin lesion  perianal lesion noted on the right  with History of rectal abscess last year.Augmentin  prescribed, advised to follow-up in the office if lesion does not resolve after completion of antibiotics      Relevant Medications   amoxicillin -clavulanate (AUGMENTIN ) 875-125 MG tablet   Food insecurity   Food from the clinic pantry provided       Meds ordered this encounter  Medications   isosorbide  mononitrate (IMDUR ) 30 MG 24 hr tablet    Sig: Take 1 tablet (30 mg total) by mouth daily.    Dispense:  90 tablet    Refill:  3   amoxicillin -clavulanate (AUGMENTIN ) 875-125 MG tablet    Sig: Take 1 tablet by mouth 2 (two) times daily.    Dispense:  14 tablet    Refill:  0    Follow-up: Return in about 3 months (around 02/23/2024) for DM, HTN.     Tresa Jolley R Jesseca Marsch, FNP

## 2023-11-24 NOTE — Assessment & Plan Note (Signed)
 Lab Results  Component Value Date   CHOL 98 (L) 07/07/2023   HDL 30 (L) 07/07/2023   LDLCALC 53 07/07/2023   LDLDIRECT 65 06/21/2023   TRIG 69 07/07/2023   CHOLHDL 3.3 07/07/2023  Not fasting checking direct LDL Continue Crestor  20 mg daily

## 2023-11-24 NOTE — Assessment & Plan Note (Addendum)
 Lab Results  Component Value Date   HGBA1C 7.0 (A) 11/24/2023   Managed with metformin  1000 mg twice daily, glipizide  2.5 mg daily, and Jardiance  10 mg daily Jardiance  samples provided at cardiology office.  No signs of hypoglycemia reported. Lacks glucose monitoring device due to financial constraints.  - Advise to avoid sugar, sweets, and soda. Will refer to the clinical pharmacist for med management, can possibly discontinue glipizide  and start Jardiance  25 mg daily to reduce risk of hypoglycemia

## 2023-11-25 LAB — CMP14+EGFR
ALT: 12 IU/L (ref 0–44)
AST: 14 IU/L (ref 0–40)
Albumin: 4.3 g/dL (ref 3.9–4.9)
Alkaline Phosphatase: 122 IU/L — ABNORMAL HIGH (ref 44–121)
BUN/Creatinine Ratio: 25 — ABNORMAL HIGH (ref 10–24)
BUN: 18 mg/dL (ref 8–27)
Bilirubin Total: 1 mg/dL (ref 0.0–1.2)
CO2: 21 mmol/L (ref 20–29)
Calcium: 9.3 mg/dL (ref 8.6–10.2)
Chloride: 101 mmol/L (ref 96–106)
Creatinine, Ser: 0.72 mg/dL — ABNORMAL LOW (ref 0.76–1.27)
Globulin, Total: 3 g/dL (ref 1.5–4.5)
Glucose: 104 mg/dL — ABNORMAL HIGH (ref 70–99)
Potassium: 4 mmol/L (ref 3.5–5.2)
Sodium: 138 mmol/L (ref 134–144)
Total Protein: 7.3 g/dL (ref 6.0–8.5)
eGFR: 101 mL/min/1.73 (ref >59)

## 2023-11-25 LAB — CBC
Hematocrit: 47 % (ref 37.5–51.0)
Hemoglobin: 14.8 g/dL (ref 13.0–17.7)
MCH: 29 pg (ref 26.6–33.0)
MCHC: 31.5 g/dL (ref 31.5–35.7)
MCV: 92 fL (ref 79–97)
Platelets: 213 x10E3/uL (ref 150–450)
RBC: 5.11 x10E6/uL (ref 4.14–5.80)
RDW: 14.5 % (ref 11.6–15.4)
WBC: 5.1 x10E3/uL (ref 3.4–10.8)

## 2023-11-25 LAB — LDL CHOLESTEROL, DIRECT: LDL Direct: 96 mg/dL (ref 0–99)

## 2023-11-27 ENCOUNTER — Ambulatory Visit: Payer: Self-pay | Admitting: Nurse Practitioner

## 2023-11-27 ENCOUNTER — Other Ambulatory Visit: Payer: Self-pay | Admitting: Nurse Practitioner

## 2023-11-27 ENCOUNTER — Other Ambulatory Visit: Payer: Self-pay

## 2023-11-27 ENCOUNTER — Telehealth: Payer: Self-pay | Admitting: *Deleted

## 2023-11-27 DIAGNOSIS — E118 Type 2 diabetes mellitus with unspecified complications: Secondary | ICD-10-CM

## 2023-11-27 MED ORDER — EMPAGLIFLOZIN 25 MG PO TABS
25.0000 mg | ORAL_TABLET | Freq: Every day | ORAL | 1 refills | Status: DC
  Filled 2023-11-27: qty 90, 90d supply, fill #0

## 2023-11-27 NOTE — Progress Notes (Signed)
 Patient was referred to pharmacy for assistance managing diabetes and obtaining Jardiance . Outreached CPhT, Ileana Lehmann, who has been attempting to get patient enrolled in Jardiance  assistance program without success. Patient has not returned requested documentation (application originally mailed out 10/16/23). Requested additional outreach by cardiology PAP team, then will continue to follow-up with patient once he is scheduled with pharmacy.   Lorain Baseman, PharmD [facility] [phone]

## 2023-11-27 NOTE — Progress Notes (Signed)
 Care Guide Pharmacy Note  11/27/2023 Name: [name] MRN: [id number] DOB: 15-Apr-1960  Referred By: Paseda, Folashade R, FNP Reason for referral: No chief complaint on file.  Used [employer] interpreter services [phone] named [name] is a 63 y.o. year old male who is a primary care patient of Paseda, Folashade R, FNP.  Javious Hallisey was referred to the pharmacist for assistance related to: DMII  Successful contact was made with the patient to discuss pharmacy services including being ready for the pharmacist to call at least 5 minutes before the scheduled appointment time and to have medication bottles and any blood pressure readings ready for review. The patient agreed to meet with the pharmacist via in office  on (date/time). 01/09/24 at 930 AM    Harlene Leonora [facility]  [facility] Guide  Direct Dial: [phone]  Fax [phone]

## 2023-12-04 ENCOUNTER — Other Ambulatory Visit (HOSPITAL_COMMUNITY): Payer: Self-pay

## 2024-01-09 ENCOUNTER — Other Ambulatory Visit (HOSPITAL_COMMUNITY): Payer: Self-pay

## 2024-01-09 ENCOUNTER — Other Ambulatory Visit: Payer: Self-pay

## 2024-01-09 ENCOUNTER — Ambulatory Visit (INDEPENDENT_AMBULATORY_CARE_PROVIDER_SITE_OTHER): Payer: Self-pay

## 2024-01-09 VITALS — BP 123/75

## 2024-01-09 DIAGNOSIS — E118 Type 2 diabetes mellitus with unspecified complications: Secondary | ICD-10-CM

## 2024-01-09 MED ORDER — METFORMIN HCL 500 MG PO TABS
500.0000 mg | ORAL_TABLET | Freq: Two times a day (BID) | ORAL | 3 refills | Status: AC
  Filled 2024-01-09: qty 180, 90d supply, fill #0

## 2024-01-09 NOTE — Progress Notes (Signed)
 01/09/2024 Name: [name] MRN: [id number] DOB: June 10, 1960  Chief Complaint  Patient presents with   Diabetes   Hyperlipidemia    [name] is a 63 y.o. year old male who was referred for medication management by their primary care provider, Paseda, Folashade R, FNP. They presented for a face to face visit today. Accompanied by [facility] interpreter.   They were referred to the pharmacist by their PCP for assistance in managing medication access . 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 At last visit, BP was 113/63 mmHg. A1C was 7.0% (improved from 7.5% in June 2025), but patient had not been able to continue Jardiance  10 mg daily, which was started by cardiology, as he had not completed the PAP forms and returned to the cardiology office. He was referred to pharmacy for medication assistance needs.  Today, patient presents in good spirits and presents without any assistance. He is ambulating with a cane. He brings his medications with him and has run out of several medications including aspirin , clopidogrel , Jardiance , isosorbide  mononitrate. He has been filling at [facility], but could switch to [facility] for improved affordability.    Care Team: Primary Care Provider: Paseda, Folashade R, FNP ; Next Scheduled Visit: 02/23/24 Cardiologist: Dr. Dede; Next Scheduled Visit: needs to be scheduled  Medication Access/Adherence  Current Pharmacy:  [facility] [address], [address] [place] [address] Phone: [phone] Fax: [phone]   Patient reports affordability concerns with their medications: Yes  - no income, not able to work since ACS event Patient reports access/transportation concerns to their pharmacy: No  - says he would be able to get to [facility] pharmacy to pick up medications Patient reports  adherence concerns with their medications:  Yes  - has run out of medications due to cost, and lack of understanding of indications (language barrier)   Diabetes:  Current medications: Jardiance  10 mg daily (not taking- ran out), metformin  IR 1000 mg BID (not taking regularly - says it makes his urine smell weird, also feels AE like fatigue), glipizide  2.5 mg daily (not taking every day)  Current glucose readings: not checking  Patient denies hypoglycemic s/sx including dizziness, shakiness, sweating. Patient denies hyperglycemic symptoms including polyuria, polydipsia, polyphagia, nocturia, neuropathy, blurred vision.  Current meal patterns: did not discuss in detail today  Current physical activity: did not discuss in detail today  Current medication access support: none - not eligible for Medicaid  Hypertension:  Current medications: metoprolol  tartrate 12.5 mg (1/2 tablet) BID (reports taking 1/2 tablet once daily), isosorbide  mononitrate 30 mg daily (not taking- ran out)  Patient denies hypotensive s/sx including dizziness, lightheadedness.  Patient denies hypertensive symptoms including headache, chest pain, shortness of breath  Hyperlipidemia/ASCVD Risk Reduction  Current lipid lowering medications: rosuvastatin  20 mg daily (continues to report bilateral muscle soreness in upper arms) Medications tried in the past: atorvastatin  80 mg daily (myalgias)  Antiplatelet regimen: aspirin  81 mg daily (not taking, ran out), clopidogrel  75 mg daily (not taking, ran out)  ASCVD History: NSTEMI, 4vCABG Feb 2025  Risk Factors: T2DM, tobacco use  Objective:  BP Readings from Last 3 Encounters:  01/09/24 123/75  11/24/23 113/63  10/02/23 96/64    Lab Results  Component Value Date   HGBA1C 7.0 (A) 11/24/2023   HGBA1C 7.5 (A) 08/21/2023  HGBA1C 6.7 (H) 05/10/2023       Latest Ref Rng & Units 11/24/2023   11:54 AM 06/17/2023    2:36 AM 06/15/2023    9:28 PM  BMP  Glucose 70  - 99 mg/dL 895  868  896   BUN 8 - 27 mg/dL 18  13  24    Creatinine 0.76 - 1.27 mg/dL 9.27  9.32  9.18   BUN/Creat Ratio 10 - 24 25     Sodium 134 - 144 mmol/L 138  137  134   Potassium 3.5 - 5.2 mmol/L 4.0  3.6  3.5   Chloride 96 - 106 mmol/L 101  102  102   CO2 20 - 29 mmol/L 21  28  24    Calcium  8.6 - 10.2 mg/dL 9.3  9.1  9.1     Lab Results  Component Value Date   CHOL 98 (L) 07/07/2023   HDL 30 (L) 07/07/2023   LDLCALC 53 07/07/2023   LDLDIRECT 96 11/24/2023   TRIG 69 07/07/2023   CHOLHDL 3.3 07/07/2023    Medications Reviewed Today     Reviewed by Brinda Lorain SQUIBB, RPH (Pharmacist) on 01/09/24 at 1345  Med List Status: <None>   Medication Order Taking? Sig Documenting Provider Last Dose Status Informant  acetaminophen  (TYLENOL ) 325 MG tablet [id number]  Take 2 tablets (650 mg total) by mouth every 6 (six) hours as needed for mild pain (pain score 1-3).  Patient not taking: Reported on 11/24/2023   Raguel Con GORMAN DEVONNA  Active Self, Pharmacy Records    Discontinued 01/09/24 [phone] (Completed Course)   aspirin  EC 81 MG tablet [id number]  Take 1 tablet (81 mg total) by mouth daily. Swallow whole.  Patient not taking: Reported on 01/09/2024   Goodrich, Callie E, PA-C  Active   Blood Glucose Monitoring Suppl (BLOOD GLUCOSE MONITOR SYSTEM) w/Device KIT [id number]  Use in the morning, at noon, and at bedtime to monitor glucose. May substitute to any manufacturer covered by patient's insurance.  Patient not taking: Reported on 11/24/2023   Paseda, Folashade R, FNP  Active   clopidogrel  (PLAVIX ) 75 MG tablet [id number]  Take 1 tablet (75 mg total) by mouth daily.  Patient not taking: Reported on 01/09/2024   West, Katlyn D, NP  Active   clotrimazole  (LOTRIMIN ) 1 % cream [id number]  Apply 2 times daily between your toes for 2 weeks.  Patient not taking: Reported on 11/24/2023   Paseda, Folashade R, FNP  Active   empagliflozin  (JARDIANCE ) 25 MG TABS tablet [id number]  Take 1 tablet (25 mg  total) by mouth daily before breakfast.  Patient not taking: Reported on 01/09/2024   Paseda, Folashade R, FNP  Active   gabapentin  (NEURONTIN ) 300 MG capsule [id number] Yes Take 1 capsule (300 mg total) by mouth 3 (three) times daily.  Patient taking differently: Take 1 capsule (300 mg total) by mouth 3 (three) times daily.   Paseda, Folashade R, FNP  Active   glipiZIDE  (GLUCOTROL ) 5 MG tablet [id number] Yes Take 0.5 tablets (2.5 mg total) by mouth daily before breakfast.  Patient taking differently: Take 0.5 tablets (2.5 mg total) by mouth daily before breakfast.   Paseda, Folashade R, FNP  Active   isosorbide  mononitrate (IMDUR ) 30 MG 24 hr tablet [id number]  Take 1 tablet (30 mg total) by mouth daily.  Patient not taking: Reported on 01/09/2024   Paseda, Folashade R, FNP  Active   Patient taking differently:   Discontinued 01/09/24 1006 (Dose change)  metFORMIN  (GLUCOPHAGE ) 500 MG tablet [id number] Yes Take 1 tablet (500 mg total) by mouth 2 (two) times daily with a meal. Paseda, Folashade R, FNP  Active   metoprolol  tartrate (LOPRESSOR ) 25 MG tablet [id number] Yes Take 0.5 tablets (12.5 mg total) by mouth 2 (two) times daily.  Patient taking differently: Take 0.5 tablets (12.5 mg total) by mouth 2 (two) times daily.   Lucien Orren SAILOR, PA-C  Active Self, Pharmacy Records  nitroGLYCERIN  (NITROSTAT ) 0.4 MG SL tablet [id number]  Take 1 tablet, under your tongue, while sitting.  If no relief of pain may repeat, one tab every 5 minutes up to 3 tablets total over 15 minutes.  If no relief CALL 911. Thukkani, Arun K, MD  Active   rosuvastatin  (CRESTOR ) 20 MG tablet [id number] Yes Take 1 tablet (20 mg total) by mouth daily. Thukkani, Arun K, MD  Active   sennosides-docusate sodium  (SENOKOT-S) 8.6-50 MG tablet [id number] Yes Take 2 tablets by mouth at bedtime as needed for constipation. Paseda, Folashade R, FNP  Active               Assessment/Plan:   Diabetes: - Currently uncontrolled with most  recent A1C of 7.0% slightly above goal <7%. Medication adherence appears sub-optimal due to cost and lack of understanding regimen. Will try to get patient approved for SGLT2i via MAP program, which could replace sulfonylurea, which is not ideal therapy in this patient who is over 65 YO. Patient agreeable to retrial of lower dose of metformin .  - Last UACR April 2025 - 6 mg/g - Reviewed long term cardiovascular and renal outcomes of uncontrolled blood sugar - Reviewed goal A1c, goal fasting, and goal 2 hour post prandial glucose - Recommend to restart metformin  IR at 500 mg BID with meals - Recommend to continue glipizide  2.5 mg daily - Obtained signatures for [place]&Me application for [place]. Will collaborate with patient advocate team to submit.   - Next A1C due 02/23/24     Hypertension: - Currently controlled with clinic BP consistently below goal less than 130/80. Patient is not having s/sx of hypo- or hyper-tension. Discussed correct dosing for metoprolol . Encouraged patient to resume isosorbide  to prevent angina as previously prescribed.  - Reviewed long term cardiovascular and renal outcomes of uncontrolled blood pressure - Reviewed appropriate blood pressure monitoring technique and reviewed goal blood pressure. Recommended to check home blood pressure and heart rateand keep a log to bring to upcoming appointments - Recommend to continue metoprolol  tartrate 12.5 mg BID, isosorbide  mononitrate 30 mg daily   Hyperlipidemia/ASCVD Risk Reduction: - Currently uncontrolled with most recent LDL-C of 96 mg/dL above goal < 55 mg/dL given ASCVD and U7IF. High intensity statin indicated, but patient has had tolerability issues with atorvastatin  80 mg daily and now rosuvastatin  20 mg daily. Advised to reduce dose of rosuvastatin  to 10 mg (1/2 tablet) once daily and monitor improvement in AE. He has upcoming appt with cardiology pharmacy team for lipid management. May benefit from PCSK9i if able to obtain  through assistance.  - Reviewed long term complications of uncontrolled cholesterol - Recommend to reduce rosuvastatin  to 10 mg (1/2 tablet) daily and monitor for improvement in myalgias.     Collaborated with [facility] pharmacy to fill medications on [facility] and discounted drug list. Patient was dispensed medications after visit.   Written patient instructions provided with current medication list and indications in spanish. Live interpreter assisted in reviewing. Patient verbalized understanding of treatment plan.   Follow Up Plan:  Pharmacist  telephone 02/27/24 PCP clinic visit in 02/23/24  Lorain Baseman, PharmD [facility] [phone]

## 2024-01-09 NOTE — Patient Instructions (Addendum)
 Qu bueno verte hoy!  Medicamentos:  Tome aspirina 81 mg una vez al da. Este anticoagulante protege el corazn y previene los infartos. Probablemente necesite continuar tomndolo como medicamento a largo plazo.  Tome clopidogrel  75 mg una vez al [employer]. Este anticoagulante protege el corazn y previene los infartos. Debe continuar tomndolo al menos hasta febrero de 2026 (un ao despus del infarto).  Tome glipizida 1/2 tableta (2.5 mg) al da. Esto es para Nature conservation officer (diabetes).  Tome metformina 500 mg (1 tableta) DOS VECES al da con las comidas. Esto es para Nature conservation officer (diabetes). Reduje la dosis hoy para [employer].  Tome mononitrato de isosorbida 30 mg al [employer]. Esto es para Ship broker.  Tome metoprolol  1/2 tableta (12.5 mg) DOS VECES al [employer]. Esto es para proteger su corazn y prevenir ataques cardacos al disminuir su frecuencia cardaca y bajar su presin arterial.  Tome rosuvastatina 20 mg una vez al da. Esto es para reducir Print production planner y prevenir ataques cardacos y accidentes cerebrovasculares. Puede causar dolor muscular, que puede presentarse en ambos brazos o piernas. Avsenos si esto le sucede y podemos probar con otro medicamento para el colesterol.  Tome gabapentina (1 cpsula) tres veces al da para el dolor nervioso.  Estamos trabajando para obtener Jardiance  (25 mg una vez al da), que tambin le ayudar a reducir sus niveles de azcar en sangre y proteger su corazn. Presentaremos la solicitud hoy mismo.  Siga con su dieta y ejercicio. Procure una dieta rica en verduras, frutas y carnes magras (pollo, pavo, pescado). Intente limitar el consumo de sal comiendo verduras frescas o congeladas (en lugar de enlatadas), enjuague las verduras enlatadas antes de cocinarlas y no aada sal adicional a las comidas.  Lolly Glaus, Doctora en [employer] [phone]  _______________________________________________________________  It was nice to see you today!  Medications:  Take aspirin  81 mg once daily. This is a blood thinner that protects your heart and prevents heart attacks. You will likely need to continue this as a long term medication.   Take clopidogrel  75 mg once daily. This is a blood thinner that protects your heart and prevents heart attacks. You need to continue this until at least February 2026 (one year since your heart attack).   Take glipizide  1/2 tablet (2.5 mg) daily. This is for blood sugars (diabetes).   Take metformin  500 mg (1 tablet) TWICE Daily with meals. This is for blood sugars (diabetes). I reduced the dose today to help with side effects.   Take isosorbide  mononitrate 30 mg daily. This is to prevent chest pain.   Take metoprolol  1/2 tablet (12.5 mg) TWICE daily. This is to protect your heart and prevent heart attacks by slowing your heart rate and lowering your blood pressure.   Take rosuvastatin  20 mg once daily. This is to lower cholesterol and prevent heart attacks and strokes. It can cause muscle soreness and pain which would occur in both arms or both legs. Let us  know if this happens for you and we can try a different cholesterol medication.   Take gabapentin  (1 capsule) three times daily for nerve pain.   We are working on getting Jardiance  (25 mg once daily) for your which will also help lower your blood sugars and protect your heart. We will submit the application today.   Keep up the good work with diet and exercise. Aim for a diet full of vegetables, fruit  and lean meats (chicken, [place], fish). Try to limit salt intake by eating fresh or frozen vegetables (instead of canned), rinse canned vegetables prior to cooking and do not add any additional salt to meals.   Lorain Baseman, PharmD [facility] [phone]

## 2024-01-10 ENCOUNTER — Other Ambulatory Visit: Payer: Self-pay

## 2024-01-10 ENCOUNTER — Telehealth: Payer: Self-pay

## 2024-01-10 NOTE — Telephone Encounter (Signed)
 Submitted application for FARXIGA to AZ&ME for patient assistance.   Phone: [phone]

## 2024-01-18 ENCOUNTER — Other Ambulatory Visit: Payer: Self-pay

## 2024-01-18 ENCOUNTER — Telehealth: Payer: Self-pay

## 2024-01-18 NOTE — Telephone Encounter (Signed)
 Received notification from AZ&ME regarding approval for [facility]. Patient assistance approved from 01/12/2024 to 01/11/2025.  Medication will ship to [address], [place], [place] [address]  Pt ID: [id number]  Company phone: [phone]

## 2024-01-25 ENCOUNTER — Ambulatory Visit: Payer: Self-pay | Attending: Cardiovascular Disease

## 2024-01-25 VITALS — Ht 62.5 in | Wt 155.0 lb

## 2024-01-25 DIAGNOSIS — E118 Type 2 diabetes mellitus with unspecified complications: Secondary | ICD-10-CM

## 2024-01-25 NOTE — Progress Notes (Signed)
 Patient ID: [name]                 DOB: 12-15-1960                    MRN: [id number]     HPI: [name] is a 63 y.o. male patient referred to pharmacy clinic by Dr. Wendel to initiate GLP1-RA therapy for T2DM/weight loss control. PMH is significant for PAD, CAD, aortic atherosclerosis, NSTEMI s/p CABG x 4 (04/2023), T2DM, HLD, HTN and obesity. Most recent BMI 28.17 kg/m . Most recent A1c 7.0 on 12/2023.   Patient presents today requiring a translator; translation services were utilized during the visit.  He reports no insurance coverage and has not obtained any since last provider visit. He has been unemployed for two years, making it difficult to afford insurance and some of his medications.   Patient is being managed for T2DM, HLD and HTN by his primary care team. He was last seen on 01/09/2024 by the pharmacist for assistance with medication access and  management. At that time, he reported running out of Jardiance  due to incomplete patient assistance paperwork. The pharmacist initiated efforts to obtain SGLT2i therapy, to potentially replace sulfonylurea. through the MAP program, and signatures were obtained for the AZ&Me application for Farxiga. His current DM regimen is metformin  500 mg BID with meals and glipizide  2.5 mg daily. Patient has a telephone f/u on 02/27/2024.  Today, patient reports that he is doing well overall other than he experiences constipation and abdominal firmness for which he takes a stool softener. He also noted a weight increase from  148 to 155 lbs and plans to discuss these concerns at his upcoming PCP appointment.   Current weight and BMI: 156 lbs and 28.17 kg/m Current meds that affect weight: glipizide    Diet/Exercise: did not discuss in detail today   Family History:  Relation Problem Comments  Mother (Deceased)   Father (Deceased) Diabetes     Neg Hx Colon cancer   Prostate cancer       Social History: Alcohol: 2 drinks per week  Smoking: none,  former smoker  Labs: Lab Results  Component Value Date   HGBA1C 7.0 (A) 11/24/2023    Wt Readings from Last 1 Encounters:  01/25/24 155 lb (70.3 kg)    BP Readings from Last 1 Encounters:  01/09/24 123/75   Pulse Readings from Last 1 Encounters:  11/24/23 74       Component Value Date/Time   CHOL 98 (L) 07/07/2023 0926   TRIG 69 07/07/2023 0926   HDL 30 (L) 07/07/2023 0926   CHOLHDL 3.3 07/07/2023 0926   CHOLHDL 6.0 05/11/2023 0551   VLDL 17 05/11/2023 0551   LDLCALC 53 07/07/2023 0926   LDLDIRECT 96 11/24/2023 1154    Past Medical History:  Diagnosis Date   Arthritis of left knee    Cervical stenosis of spine    Diabetes mellitus without complication (HCC)    High cholesterol    Hyperlipidemia LDL goal <70    Primary hypertension 02/07/2022   Tobacco use     Current Outpatient Medications on File Prior to Visit  Medication Sig Dispense Refill   acetaminophen  (TYLENOL ) 325 MG tablet Take 2 tablets (650 mg total) by mouth every 6 (six) hours as needed for mild pain (pain score 1-3). (Patient not taking: Reported on 11/24/2023)     aspirin  EC 81 MG tablet Take 1 tablet (81 mg total) by mouth daily. Swallow  whole. (Patient not taking: Reported on 01/09/2024) 90 tablet 3   Blood Glucose Monitoring Suppl (BLOOD GLUCOSE MONITOR SYSTEM) w/Device KIT Use in the morning, at noon, and at bedtime to monitor glucose. May substitute to any manufacturer covered by patient's insurance. (Patient not taking: Reported on 11/24/2023) 1 kit 0   clopidogrel  (PLAVIX ) 75 MG tablet Take 1 tablet (75 mg total) by mouth daily. (Patient not taking: Reported on 01/09/2024) 90 tablet 3   clotrimazole  (LOTRIMIN ) 1 % cream Apply 2 times daily between your toes for 2 weeks. (Patient not taking: Reported on 11/24/2023) 30 g 0   empagliflozin  (JARDIANCE ) 25 MG TABS tablet Take 1 tablet (25 mg total) by mouth daily before breakfast. (Patient not taking: Reported on 01/09/2024) 90 tablet 1   gabapentin   (NEURONTIN ) 300 MG capsule Take 1 capsule (300 mg total) by mouth 3 (three) times daily. (Patient taking differently: Take 1 capsule (300 mg total) by mouth 3 (three) times daily.) 90 capsule 3   glipiZIDE  (GLUCOTROL ) 5 MG tablet Take 0.5 tablets (2.5 mg total) by mouth daily before breakfast. (Patient taking differently: Take 0.5 tablets (2.5 mg total) by mouth daily before breakfast.) 45 tablet 1   isosorbide  mononitrate (IMDUR ) 30 MG 24 hr tablet Take 1 tablet (30 mg total) by mouth daily. (Patient not taking: Reported on 01/09/2024) 90 tablet 3   metFORMIN  (GLUCOPHAGE ) 500 MG tablet Take 1 tablet (500 mg total) by mouth 2 (two) times daily with a meal. 180 tablet 3   metoprolol  tartrate (LOPRESSOR ) 25 MG tablet Take 0.5 tablets (12.5 mg total) by mouth 2 (two) times daily. (Patient taking differently: Take 0.5 tablets (12.5 mg total) by mouth 2 (two) times daily.) 90 tablet 3   nitroGLYCERIN  (NITROSTAT ) 0.4 MG SL tablet Take 1 tablet, under your tongue, while sitting.  If no relief of pain may repeat, one tab every 5 minutes up to 3 tablets total over 15 minutes.  If no relief CALL 911. 25 tablet 3   rosuvastatin  (CRESTOR ) 20 MG tablet Take 1 tablet (20 mg total) by mouth daily. 90 tablet 3   sennosides-docusate sodium  (SENOKOT-S) 8.6-50 MG tablet Take 2 tablets by mouth at bedtime as needed for constipation. 60 tablet 1   No current facility-administered medications on file prior to visit.    Allergies  Allergen Reactions   Atorvastatin  Other (See Comments)    Muscle pain     Assessment/Plan:  T2DM  Assessment:  Most recent A1c was 7% on 11/2023 which is slightly above goal of <7% but improved from previous A1c of 7.5% on 04/2023 BMI indicates overweight; patient has a hx of NSTEMI, making GLP-1RA (e.g. semaglutide) a consideration due potential MACE benefit.  Patient is currently tolerating his diabetes regimen of metformin  500 mg BID and glipizide  2.5 mg daily without episodes of  hypoglycemia. GLP-1 RA therapy was discussed, including mechanism of action, cardiovascular benefits, dosing, efficacy, and potential side effects.  Plan: Continue taking metformin  500 mg twice daily and glipizide  2.5 mg daily; continue regular blood glucose monitoring Will defer initiation of GLP-1 RA therapy until the outcome of Farxiga PAP is determined If patient successfully obtains Farxiga, reassess glycemic control and additional benefit from GLP-1RA with ASCVD risk reduction, potentially through Novo Nordisk PAP, if needed Will continue to follow up on status of Farxiga retrieval and coordinate care with primary care team. Patient has televisit with primary care clinical pharmacist 02/2024.   Rotha Cassels E. Tamesha Ellerbrock, Pharm.D [facility] [address], [place], [place] [address] Phone: [phone]; Fax: [phone]

## 2024-01-25 NOTE — Assessment & Plan Note (Addendum)
 Assessment:  Most recent A1c was 7% on 11/2023 which is slightly above goal of <7% but improved from previous A1c of 7.5% on 04/2023 BMI indicates overweight; patient has a hx of NSTEMI, making GLP-1RA (e.g. semaglutide) a consideration due potential MACE benefit.  Patient is currently tolerating his diabetes regimen of metformin  500 mg BID and glipizide  2.5 mg daily without episodes of hypoglycemia. GLP-1 RA therapy was discussed, including mechanism of action, cardiovascular benefits, dosing, efficacy, and potential side effects.  Plan: Continue taking metformin  500 mg twice daily and glipizide  2.5 mg daily; continue regular blood glucose monitoring Will defer initiation of GLP-1 RA therapy until the outcome of Farxiga PAP is determined If patient successfully obtains Farxiga, can reassess glycemic control and additional benefit from GLP-1RA with ASCVD risk reduction, potentially through Novo Nordisk PAP, if needed Will continue to follow up on status of Farxiga retrieval and coordinate care with primary care team.

## 2024-01-29 ENCOUNTER — Other Ambulatory Visit: Payer: Self-pay

## 2024-02-23 ENCOUNTER — Other Ambulatory Visit: Payer: Self-pay

## 2024-02-23 ENCOUNTER — Ambulatory Visit: Payer: Self-pay | Admitting: Nurse Practitioner

## 2024-02-27 ENCOUNTER — Other Ambulatory Visit (INDEPENDENT_AMBULATORY_CARE_PROVIDER_SITE_OTHER): Payer: Self-pay

## 2024-02-27 ENCOUNTER — Other Ambulatory Visit: Payer: Self-pay

## 2024-02-27 DIAGNOSIS — E118 Type 2 diabetes mellitus with unspecified complications: Secondary | ICD-10-CM

## 2024-02-27 MED ORDER — GLIPIZIDE 5 MG PO TABS
2.5000 mg | ORAL_TABLET | Freq: Every day | ORAL | 1 refills | Status: DC
  Filled 2024-03-07: qty 45, 90d supply, fill #0

## 2024-02-27 MED ORDER — DAPAGLIFLOZIN PROPANEDIOL 10 MG PO TABS: 10.0000 mg | ORAL_TABLET | Freq: Every day | ORAL | 3 refills | Status: AC

## 2024-02-27 NOTE — Progress Notes (Signed)
 02/27/2024 Name: [name] MRN: [id number] DOB: 10/03/1960  Chief Complaint  Patient presents with   Diabetes   Hypertension    [name] is a 63 y.o. year old male who was referred for medication management by their primary care provider, Paseda, Folashade R, FNP. They presented for a telephone visit today. [employer] ID #[id number].   They were referred to the pharmacist by their PCP for assistance in managing medication access . 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 At last visit, BP was 113/63 mmHg. A1C was 7.0% (improved from 7.5% in June 2025), but patient had not been able to continue Jardiance  10 mg daily, which was started by cardiology, as he had not completed the PAP forms and returned to the cardiology office. He was referred to pharmacy for medication assistance needs. At office visit on 01/09/24, patient brought his medications with him. He was out of aspirin , clopidogrel , Jardiance , isosorbide  mononitrate, which I assisted him in filling at [facility] pharmacy. Also obtained signatures for Farxiga  application, which was approved on 01/12/24. Patient was advised to restart metformin  at 500 mg BID and rosuvastatin  at 1/2 tab (10 mg daily) due to reported side effects.   Today, patient states that he is on the way to the pharmacy. States he has been out of several medications due to not being able to afford them. States he does not have the AVS I provided him with last time, and is not sure what medications he should be taking. Reports he has not received shipment of Farxiga  - upon review, does not appear a prescription was sent to [facility] after approval. Did not discuss symptoms and side effects today, as patient was very confused about the pharmacy and his medications.   Care Team: Primary Care Provider: Paseda, Folashade R, FNP ; Next Scheduled Visit:  02/23/24 - no show, scheduled for 03/27/23 Cardiologist: Dr. Dede; Next Scheduled Visit: needs to be scheduled  Medication Access/Adherence  Current Pharmacy:  [facility] - [facility] [address], [address] [place] [address] Phone: [phone] Fax: [phone]  [facility] [place] [place] - [address] [place] [address] Phone: [phone] Fax: [phone]   Patient reports affordability concerns with their medications: Yes  - no income, not able to work since ACS event Patient reports access/transportation concerns to their pharmacy: No  - says he would be able to get to [facility] pharmacy to pick up medications Patient reports adherence concerns with their medications:  Yes  - has run out of medications due to cost, and lack of understanding of indications (language barrier)   Diabetes:  Current medications:  metformin  IR 500 mg BID (dispensed 90ds on 01/09/24), glipizide  2.5 mg daily (dispensed 01/09/24 for 30ds), Farxiga  10 mg daily (approved, but reports he has not received shipment)  Current glucose readings: did not discuss today  Current medication access support: none - not eligible for Medicaid  Hypertension:  Current medications: metoprolol  tartrate 12.5 mg (1/2 tablet) BID (patient states he lost this medication), isosorbide  mononitrate 30 mg daily (dispensed 30ds on 01/09/24)  Hyperlipidemia/ASCVD Risk Reduction  Current lipid lowering medications: rosuvastatin  10 mg daily (unable to confirm whether he decreased dose, 20 mg tabs dispensed for 30ds on 01/09/24) Medications tried in the past: atorvastatin  80 mg daily (  myalgias), rosuvastatin  20 mg daily (myalgias)  Antiplatelet regimen: aspirin  81 mg daily (dispensed 90ds on 01/09/24), clopidogrel  75 mg daily (dispensed 30ds on 01/09/24)  ASCVD History: NSTEMI, 4vCABG Feb 2025  Risk Factors: T2DM, tobacco use  Objective:  BP Readings from  Last 3 Encounters:  01/09/24 123/75  11/24/23 113/63  10/02/23 96/64    Lab Results  Component Value Date   HGBA1C 7.0 (A) 11/24/2023   HGBA1C 7.5 (A) 08/21/2023   HGBA1C 6.7 (H) 05/10/2023       Latest Ref Rng & Units 11/24/2023   11:54 AM 06/17/2023    2:36 AM 06/15/2023    9:28 PM  BMP  Glucose 70 - 99 mg/dL 895  868  896   BUN 8 - 27 mg/dL 18  13  24    Creatinine 0.76 - 1.27 mg/dL 9.27  9.32  9.18   BUN/Creat Ratio 10 - 24 25     Sodium 134 - 144 mmol/L 138  137  134   Potassium 3.5 - 5.2 mmol/L 4.0  3.6  3.5   Chloride 96 - 106 mmol/L 101  102  102   CO2 20 - 29 mmol/L 21  28  24    Calcium  8.6 - 10.2 mg/dL 9.3  9.1  9.1     Lab Results  Component Value Date   CHOL 98 (L) 07/07/2023   HDL 30 (L) 07/07/2023   LDLCALC 53 07/07/2023   LDLDIRECT 96 11/24/2023   TRIG 69 07/07/2023   CHOLHDL 3.3 07/07/2023    Medications Reviewed Today     Reviewed by Brinda Lorain SQUIBB, RPH-CPP (Pharmacist) on 02/27/24 at [phone]  Med List Status: <None>   Medication Order Taking? Sig Documenting Provider Last Dose Status Informant  acetaminophen  (TYLENOL ) 325 MG tablet [id number]  Take 2 tablets (650 mg total) by mouth every 6 (six) hours as needed for mild pain (pain score 1-3).  Patient not taking: Reported on 11/24/2023   Raguel Con GORMAN DEVONNA  Active Self, Pharmacy Records  aspirin  EC 81 MG tablet [id number]  Take 1 tablet (81 mg total) by mouth daily. Swallow whole.  Patient not taking: Reported on 01/09/2024   Goodrich, Callie E, PA-C  Active   Blood Glucose Monitoring Suppl (BLOOD GLUCOSE MONITOR SYSTEM) w/Device KIT [id number]  Use in the morning, at noon, and at bedtime to monitor glucose. May substitute to any manufacturer covered by patient's insurance.  Patient not taking: Reported on 11/24/2023   Paseda, Folashade R, FNP  Active   clopidogrel  (PLAVIX ) 75 MG tablet [id number]  Take 1 tablet (75 mg total) by mouth daily.  Patient not taking: Reported on 01/09/2024   West, Katlyn D, NP   Active   clotrimazole  (LOTRIMIN ) 1 % cream [id number]  Apply 2 times daily between your toes for 2 weeks.  Patient not taking: Reported on 11/24/2023   Paseda, Folashade R, FNP  Active   dapagliflozin  propanediol (FARXIGA ) 10 MG TABS tablet [id number] Yes Take 1 tablet (10 mg total) by mouth daily. Paseda, Folashade R, FNP  Active    Patient not taking:   Discontinued 02/27/24 9071 (Cost of medication)            Med Note>> Brinda Lorain SQUIBB, RPH-CPP   02/27/2024  9:28 AM Switching to Farxiga  for Patient Assistance    gabapentin  (NEURONTIN ) 300 MG capsule [id number]  Take 1 capsule (300 mg total) by mouth 3 (three) times daily.  Patient taking differently: Take 1 capsule (300 mg total) by mouth  3 (three) times daily.   Paseda, Folashade R, FNP  Active   glipiZIDE  (GLUCOTROL ) 5 MG tablet [id number]  Take 0.5 tablets (2.5 mg total) by mouth daily before breakfast.  Patient taking differently: Take 0.5 tablets (2.5 mg total) by mouth daily before breakfast.   Paseda, Folashade R, FNP  Active   isosorbide  mononitrate (IMDUR ) 30 MG 24 hr tablet [id number]  Take 1 tablet (30 mg total) by mouth daily.  Patient not taking: Reported on 01/09/2024   Paseda, Folashade R, FNP  Active   metFORMIN  (GLUCOPHAGE ) 500 MG tablet [id number]  Take 1 tablet (500 mg total) by mouth 2 (two) times daily with a meal. Paseda, Folashade R, FNP  Active   metoprolol  tartrate (LOPRESSOR ) 25 MG tablet [id number]  Take 0.5 tablets (12.5 mg total) by mouth 2 (two) times daily.  Patient not taking: Reported on 02/27/2024   Lucien Orren SAILOR, PA-C  Active Self, Pharmacy Records  nitroGLYCERIN  (NITROSTAT ) 0.4 MG SL tablet [id number]  Take 1 tablet, under your tongue, while sitting.  If no relief of pain may repeat, one tab every 5 minutes up to 3 tablets total over 15 minutes.  If no relief CALL 911. Thukkani, Arun K, MD  Active   rosuvastatin  (CRESTOR ) 20 MG tablet [id number]  Take 1 tablet (20 mg total) by mouth daily. Thukkani, Arun K, MD   Active   sennosides-docusate sodium  (SENOKOT-S) 8.6-50 MG tablet [id number]  Take 2 tablets by mouth at bedtime as needed for constipation. Paseda, Folashade R, FNP  Active               Assessment/Plan:   Diabetes: - Currently uncontrolled with most recent A1C of 7.0% slightly above goal <7%. Medication adherence appears sub-optimal due to cost and lack of understanding regimen. Patient was approved for Farxiga  via [employer], however he has not received shipment. If he is able to initiate SGLT2i, can likely d/c sulfonylurea, which is not ideal therapy in this patient who is over 41 YO.  - Last UACR April 2025 - 6 mg/g - Reviewed long term cardiovascular and renal outcomes of uncontrolled blood sugar - Reviewed goal A1c, goal fasting, and goal 2 hour post prandial glucose - Recommend to continue metformin  IR at 500 mg BID with meals - Recommend to continue glipizide  2.5 mg daily - Will follow-up on shipment of Farxiga . Rx sent to [facility] patient assistance, mail order pharmacy, today. - Next A1C due NOW   Hypertension: - Currently controlled with clinic BP consistently below goal less than 130/80. Patient is not having s/sx of hypo- or hyper-tension. Will assist in obtaining refills of maintenance medications today - Reviewed long term cardiovascular and renal outcomes of uncontrolled blood pressure - Reviewed appropriate blood pressure monitoring technique and reviewed goal blood pressure. Recommended to check home blood pressure and heart rateand keep a log to bring to upcoming appointments - Recommend to continue metoprolol  tartrate 12.5 mg BID, isosorbide  mononitrate 30 mg daily   Hyperlipidemia/ASCVD Risk Reduction: - Currently uncontrolled with most recent LDL-C of 96 mg/dL above goal < 55 mg/dL given ASCVD and U7IF. High intensity statin indicated, but patient has had tolerability issues with atorvastatin  80 mg daily and rosuvastatin  20 mg daily. Advised to reduce dose of  rosuvastatin  to 10 mg (1/2 tablet) once daily and monitor improvement in AE at previous visit, but due to patient's confusion, unable to confirm tolerability today.  - Reviewed long term complications of uncontrolled cholesterol - Recommend to continue rosuvastatin  to 10 mg (  1/2 tablet) daily. Need to follow-up about tolerability at next appt.   Collaborated with [facility] pharmacy to fill medications on DOH and discounted drug list for pickup today. Patient was on the way to the pharmacy during out call. Requested for pharmacist at [facility] to re-print AVS that I created for patient with current medication list on 01/09/24.   Follow Up Plan:  PCP clinic visit in 03/26/24 - will see patient at this time Pharmacist in person 04/23/24  Lorain Baseman, PharmD [facility] [phone]

## 2024-02-27 NOTE — Progress Notes (Signed)
 No problem! I see you were able to speak with [name] and get the Farxiga  rx sent to [facility] today. Please let me know if there is anything else I can do to help in the future.  Thank you  Jadriel Saxer E. Nainika Newlun, Pharm.D, CPP [facility] [address], [place], [place] [address] Phone: [phone]; Fax: [phone]

## 2024-03-04 ENCOUNTER — Encounter (HOSPITAL_COMMUNITY): Payer: Self-pay | Admitting: Pharmacy Technician

## 2024-03-04 ENCOUNTER — Telehealth: Payer: Self-pay | Admitting: Pharmacy Technician

## 2024-03-04 NOTE — Patient Outreach (Signed)
 Erroneous Encounter.  [name], CPhT [facility] Office: [phone] Email: [email or web address]

## 2024-03-04 NOTE — Progress Notes (Signed)
° °  03/04/2024  Patient ID: Mancel Overland, male   DOB: 1960/10/25, 63 y.o.   MRN: [id number]  Patient engaged with clinical pharmacist for management of diabetes and hypertension on 02/27/2024. Outreach by [employer] technician was requested.   Outreached patient to discuss diabetes and hypertension medication management. Unfortunately, patient did not answer the call. A message came on stating the wireless caller is not available please try call again later. [employer] Lizette attempted twice and received same message two times. Her id is [id number].  Rocko Fesperman, CPhT [facility] Office: [phone] Email: [email or web address]

## 2024-03-07 ENCOUNTER — Telehealth: Payer: Self-pay | Admitting: Pharmacy Technician

## 2024-03-07 ENCOUNTER — Other Ambulatory Visit (HOSPITAL_COMMUNITY): Payer: Self-pay

## 2024-03-07 ENCOUNTER — Other Ambulatory Visit: Payer: Self-pay

## 2024-03-07 NOTE — Progress Notes (Addendum)
 03/07/2024 Name: [name] MRN: [id number] DOB: 07/26/1960  Patient is appearing for a follow-up visit with the population health pharmacy technician. Last engaged with the clinical pharmacist to discuss diabetes and hypertension on 02/27/2024. Contacted patient today to discuss medication access. Was assisted today by [employer] whose id number is [id number].  Plan from last clinical pharmacist appointment:  Diabetes: - Currently uncontrolled with most recent A1C of 7.0% slightly above goal <7%. Medication adherence appears sub-optimal due to cost and lack of understanding regimen. Patient was approved for Farxiga  via [employer]Me, however he has not received shipment. If he is able to initiate SGLT2i, can likely d/c sulfonylurea, which is not ideal therapy in this patient who is over 43 YO.  - Last UACR April 2025 - 6 mg/g - Reviewed long term cardiovascular and renal outcomes of uncontrolled blood sugar - Reviewed goal A1c, goal fasting, and goal 2 hour post prandial glucose - Recommend to continue metformin  IR at 500 mg BID with meals - Recommend to continue glipizide  2.5 mg daily - Will follow-up on shipment of Farxiga . Rx sent to [facility] patient assistance, mail order pharmacy, today. - Next A1C due NOW Hypertension: - Currently controlled with clinic BP consistently below goal less than 130/80. Patient is not having s/sx of hypo- or hyper-tension. Will assist in obtaining refills of maintenance medications today - Reviewed long term cardiovascular and renal outcomes of uncontrolled blood pressure - Reviewed appropriate blood pressure monitoring technique and reviewed goal blood pressure. Recommended to check home blood pressure and heart rateand keep a log to bring to upcoming appointments - Recommend to continue metoprolol  tartrate 12.5 mg BID, isosorbide  mononitrate 30 mg daily Hyperlipidemia/ASCVD Risk Reduction: - Currently uncontrolled with most recent LDL-C of 96 mg/dL above  goal < 55 mg/dL given ASCVD and U7IF. High intensity statin indicated, but patient has had tolerability issues with atorvastatin  80 mg daily and rosuvastatin  20 mg daily. Advised to reduce dose of rosuvastatin  to 10 mg (1/2 tablet) once daily and monitor improvement in AE at previous visit, but due to patient's confusion, unable to confirm tolerability today.  - Reviewed long term complications of uncontrolled cholesterol - Recommend to continue rosuvastatin  to 10 mg (1/2 tablet) daily. Need to follow-up about tolerability at next appt. -Collaborated with [facility] pharmacy to fill medications on DOH and discounted drug list for pickup today. Patient was on the way to the pharmacy during out call. Requested for pharmacist at [facility] to re-print AVS that I created for patient with current medication list on 01/09/24. -Follow Up Plan:  PCP clinic visit in 03/26/24 - will see patient at this time Pharmacist in person 2/3/(copy/paste from last note)   Medication Adherence Barriers Identified:  Patient made recommended medication changes per plan: Yes Patient informs he is taking the medications that he has on hand. He informs he is not currently working but has some money that he could go and pick up the medications that he is missing. He informs he is taking aspirin  daily, Clopidogrel  daily, Isosorbide  daily, Metformin  twice a day and Metoprolol  twice a day. He also informs he has been taking a yellow capsule for pain (gabapentin ) but he is out of that medication and he also takes Glipizide  and a whole tablet Rosuvastatin  daily though directions above inform him to take 1/2 tablet daily. Informed patient based on note above that he is to cut this tablet in 1/2 and take 1/2 per day. Access issues with any new medication or testing device:  Yes Patient informs he needs Gabapentin .Pharmacy staff informs this is currently ready for him to pick up. Pharmacy at [facility] informs he is due for refills on  Glipizide , Metoprolol  and Rosuvastatin . Per pharmacy staff, they will refill Gabapentin , Glipizide  and Metoprolol .  Pharmacy staff informs they will work with him and adjust quantities to assist patient in being able to afford to pick them up.Spoke to embedded PharmD and she will send in an updated Rosuvastatin  prescription. Patient also informs he DID NOT receive Farxiga  shipment. Attempted to provide patient phone number to call [employer] PAP but he was driving at the time of the call. Will inform embedded PharmD. Patient portion of patient assistance completed: Yes Patient is approved for PAP thru 01/11/2025.PharmD   Medication Adherence Barriers Addressed/Actions Taken:  Reviewed medication changes per plan from last clinical pharmacist note Medication Access for Gabapentin , Glipidie, Metoprolol , Rosuvastatin  and Farxiga  Will discuss medication access concerns with pharmacist Contacted pharmacy regarding refills on Gabapentin , Gipizide and Metoprolol  Collaborated with PharmD to send in updated prescription with correct directions for Rosuvastatin . Informed PharmD that patient informs he did not receive Farxiga  shipment.   Reminded patient of date/time of upcoming clinical pharmacist follow up and any upcoming PCP/specialists visits. Patient denies transportation barriers to the appointment. Yes  Next clinical pharmacist appointment is scheduled for: 04/23/2024  Kate Caddy, CPhT [facility] Office: [phone] Email: [email or web address]

## 2024-03-07 NOTE — Progress Notes (Signed)
 Advised pharmacy to refill rosuvastatin  20 mg tablets, though patient was previously instructed to cut in half and take 10 mg daily to monitor for improved tolerability. Patient states he has been taking rosuvastatin  20 mg daily without issue per conversation with Jill Simcox, CPhT.   Lorain Baseman, PharmD [facility] [phone]

## 2024-03-18 ENCOUNTER — Other Ambulatory Visit: Payer: Self-pay

## 2024-03-19 ENCOUNTER — Other Ambulatory Visit: Payer: Self-pay

## 2024-03-26 ENCOUNTER — Ambulatory Visit (INDEPENDENT_AMBULATORY_CARE_PROVIDER_SITE_OTHER): Payer: Self-pay | Admitting: Nurse Practitioner

## 2024-03-26 ENCOUNTER — Other Ambulatory Visit: Payer: Self-pay

## 2024-03-26 ENCOUNTER — Encounter: Payer: Self-pay | Admitting: Nurse Practitioner

## 2024-03-26 VITALS — BP 119/67 | HR 79 | Temp 97.6°F | Wt 167.0 lb

## 2024-03-26 DIAGNOSIS — Z599 Problem related to housing and economic circumstances, unspecified: Secondary | ICD-10-CM | POA: Insufficient documentation

## 2024-03-26 DIAGNOSIS — I2510 Atherosclerotic heart disease of native coronary artery without angina pectoris: Secondary | ICD-10-CM

## 2024-03-26 DIAGNOSIS — I10 Essential (primary) hypertension: Secondary | ICD-10-CM

## 2024-03-26 DIAGNOSIS — E785 Hyperlipidemia, unspecified: Secondary | ICD-10-CM

## 2024-03-26 DIAGNOSIS — M25561 Pain in right knee: Secondary | ICD-10-CM

## 2024-03-26 DIAGNOSIS — K5900 Constipation, unspecified: Secondary | ICD-10-CM

## 2024-03-26 DIAGNOSIS — G8929 Other chronic pain: Secondary | ICD-10-CM

## 2024-03-26 DIAGNOSIS — Z951 Presence of aortocoronary bypass graft: Secondary | ICD-10-CM

## 2024-03-26 DIAGNOSIS — E118 Type 2 diabetes mellitus with unspecified complications: Secondary | ICD-10-CM

## 2024-03-26 DIAGNOSIS — Z5941 Food insecurity: Secondary | ICD-10-CM

## 2024-03-26 DIAGNOSIS — M19011 Primary osteoarthritis, right shoulder: Secondary | ICD-10-CM

## 2024-03-26 MED ORDER — GLIPIZIDE 5 MG PO TABS
2.5000 mg | ORAL_TABLET | Freq: Every day | ORAL | 1 refills | Status: AC
  Filled 2024-03-26: qty 45, 90d supply, fill #0

## 2024-03-26 MED ORDER — METOPROLOL TARTRATE 25 MG PO TABS
12.5000 mg | ORAL_TABLET | Freq: Two times a day (BID) | ORAL | 3 refills | Status: AC
  Filled 2024-03-26: qty 90, 90d supply, fill #0

## 2024-03-26 MED ORDER — SENNA-DOCUSATE SODIUM 8.6-50 MG PO TABS
2.0000 | ORAL_TABLET | Freq: Every evening | ORAL | 1 refills | Status: AC | PRN
  Filled 2024-03-26: qty 60, 30d supply, fill #0

## 2024-03-26 MED ORDER — CLOPIDOGREL BISULFATE 75 MG PO TABS
75.0000 mg | ORAL_TABLET | Freq: Every day | ORAL | 3 refills | Status: AC
  Filled 2024-03-26: qty 90, 90d supply, fill #0

## 2024-03-26 MED ORDER — ISOSORBIDE MONONITRATE ER 30 MG PO TB24
30.0000 mg | ORAL_TABLET | Freq: Every day | ORAL | 3 refills | Status: AC
  Filled 2024-03-26: qty 90, 90d supply, fill #0

## 2024-03-26 MED ORDER — SENNA-DOCUSATE SODIUM 8.6-50 MG PO TABS
2.0000 | ORAL_TABLET | Freq: Every evening | ORAL | 1 refills | Status: DC | PRN
  Filled 2024-03-26: qty 60, 30d supply, fill #0

## 2024-03-26 MED ORDER — CLOPIDOGREL BISULFATE 75 MG PO TABS
75.0000 mg | ORAL_TABLET | Freq: Every day | ORAL | 3 refills | Status: DC
  Filled 2024-03-26: qty 90, 90d supply, fill #0

## 2024-03-26 MED ORDER — GLIPIZIDE 5 MG PO TABS
2.5000 mg | ORAL_TABLET | Freq: Every day | ORAL | 1 refills | Status: DC
  Filled 2024-03-26: qty 45, 90d supply, fill #0

## 2024-03-26 NOTE — Assessment & Plan Note (Signed)
 Lab Results  Component Value Date   CHOL 98 (L) 07/07/2023   HDL 30 (L) 07/07/2023   LDLCALC 53 07/07/2023   LDLDIRECT 96 11/24/2023   TRIG 69 07/07/2023   CHOLHDL 3.3 07/07/2023   Continue rosuvastatin  20 mg daily, - Advised dietary modifications, reducing salty, fatty, and fried foods.

## 2024-03-26 NOTE — Assessment & Plan Note (Addendum)
 Constipation Reports hard stools. Not taking prescribed Senokot. - Refilled Senokot prescription. - Advised taking two Senokot tablets at bedtime as needed. - Encouraged drinking 64 ounces of water daily and consuming vegetables  .

## 2024-03-26 NOTE — Assessment & Plan Note (Signed)
 Food from the clinic pantry provided

## 2024-03-26 NOTE — Assessment & Plan Note (Signed)
 Continue Plavix  75 mg daily, aspirin  81 mg daily metoprolol  12.5 mg twice daily, isosorbide  mononitrate 30 mg daily, rosuvastatin  20mg  daily

## 2024-03-26 NOTE — Assessment & Plan Note (Signed)
 Advised Tylenol  650 mg every 8 hours as needed, Gabapentin  300 mg 3 times daily

## 2024-03-26 NOTE — Assessment & Plan Note (Signed)
 Type 2 diabetes mellitus Managed with glipizide , metformin , and empagliflozin . Glipizide  not taken for a week due to financial constraints. No glucometer for home monitoring. Reports fatigue, possible hypoglycemia. - Refilled glipizide  2.5 mg daily prescription.  Continue metformin  500 mg twice daily - Advised obtaining glucometer for home monitoring. - Ordered A1c test. - Educated on hypoglycemia signs and advised eating if symptoms occur. - Encouraged taking medications with food. Appreciate collaboration with the clinical pharmacist

## 2024-03-26 NOTE — Patient Instructions (Signed)

## 2024-03-26 NOTE — Assessment & Plan Note (Signed)
"   Affects ability to afford medications. Financial counselor recommended. - Referred to financial counselor to assist with application for orange card - Advised discussing financial difficulties with pharmacy for charge account options. Hyperlipidemia "

## 2024-03-26 NOTE — Progress Notes (Signed)
 "  Established Patient Office Visit  Subjective:  Patient ID: [name], male    DOB: 09-Aug-1960  Age: 65 y.o. MRN: [id number]  CC:  Chief Complaint  Patient presents with   Diabetes   Hyperlipidemia   Constipation    HPI   Discussed the use of AI scribe software for clinical note transcription with the patient, who gave verbal consent to proceed.  History of Present Illness [name] is a 64 year old male  has a past medical history of Arthritis of left knee, Cervical stenosis of spine, Diabetes mellitus without complication (HCC), High cholesterol, Hyperlipidemia LDL goal <70, Primary hypertension (02/07/2022), and Tobacco use.   who presents for follow-up for his chronic medical conditions   Type 2 diabetes he ran out of his glipizide   approximately one week ago due to financial constraints and has difficulty affording medications as he is not working consistently because of leg pain. He is currently taking metformin  500 mg twice daily for diabetes, takes Crestor  20 mg daily for hyperlipidemia. he does not have a glucometer to monitor his blood sugar at home and is unsure of the symptoms of hypoglycemia, although he sometimes feels tired. No symptoms of sweating or shakiness associated with hypoglycemia.  No clinical pharmacist working on getting patient assistance for Farxiga  10 mg tablets  Hypertension he also takes metoprolol , 12.5mg   in the morning, but was unaware it should be taken twice daily, isosorbide  mononitrate 30 mg daily,    He experiences constipation with very hard stools. He has not been taking the prescribed medication for constipation, Senokot.  He describes experiencing leg pain that affects his ability to work. He also reports feeling cold sometimes and experiencing a sensation like 'nail' in his back and shoulder, which he attributes to arthritis. Takes gabapentin  300mg  twice daily.   He does not have a blood pressure machine at home. He is fasting today.     Assessment & Plan      Past Medical History:  Diagnosis Date   Arthritis of left knee    Cervical stenosis of spine    Diabetes mellitus without complication (HCC)    High cholesterol    Hyperlipidemia LDL goal <70    Primary hypertension 02/07/2022   Tobacco use     Past Surgical History:  Procedure Laterality Date   CORONARY ARTERY BYPASS GRAFT N/A 05/15/2023   Procedure: CORONARY ARTERY BYPASS GRAFTING (CABG) TIMES FOUR USING LEFT INTERNAL MAMMARY ARTERY AND ENDOSCOPICALLY HARVESTED RIGHT GREATER SAPHENOUS VEIN;  Surgeon: Kerrin Elspeth BROCKS, MD;  Location: [facility] OR;  Service: Open Heart Surgery;  Laterality: N/A;   LEFT HEART CATH AND CORONARY ANGIOGRAPHY N/A 05/11/2023   Procedure: LEFT HEART CATH AND CORONARY ANGIOGRAPHY;  Surgeon: Wonda Sharper, MD;  Location: [facility] INVASIVE CV LAB;  Service: Cardiovascular;  Laterality: N/A;   LEFT HEART CATH AND CORS/GRAFTS ANGIOGRAPHY N/A 06/16/2023   Procedure: LEFT HEART CATH AND CORS/GRAFTS ANGIOGRAPHY;  Surgeon: Ladona Heinz, MD;  Location: [facility] INVASIVE CV LAB;  Service: Cardiovascular;  Laterality: N/A;   TEE WITHOUT CARDIOVERSION N/A 05/15/2023   Procedure: TRANSESOPHAGEAL ECHOCARDIOGRAM (TEE);  Surgeon: Kerrin Elspeth BROCKS, MD;  Location: [facility] OR;  Service: Open Heart Surgery;  Laterality: N/A;    Family History  Problem Relation Age of Onset   Diabetes Father    Colon cancer Neg Hx    Prostate cancer Neg Hx     Social History   Socioeconomic History   Marital status: Single    Spouse name: Not on  file   Number of children: 9   Years of education: Not on file   Highest education level: Not on file  Occupational History   Not on file  Tobacco Use   Smoking status: Former    Types: Cigarettes   Smokeless tobacco: Current  Substance and Sexual Activity   Alcohol use: Yes    Alcohol/week: 2.0 standard drinks of alcohol    Types: 2 Cans of beer per week    Comment: occas   Drug use: Not Currently    Comment: ued to smoke  weed   Sexual activity: Not on file  Other Topics Concern   Not on file  Social History Narrative   Lives alone with a room mate .    Social Drivers of Health   Tobacco Use: High Risk (03/26/2024)   Patient History    Smoking Tobacco Use: Former    Smokeless Tobacco Use: Current    Passive Exposure: Not on file  Financial Resource Strain: High Risk (07/14/2023)   Overall Financial Resource Strain (CARDIA)    Difficulty of Paying Living Expenses: Hard  Food Insecurity: Food Insecurity Present (03/26/2024)   Epic    Worried About Programme Researcher, Broadcasting/film/video in the Last Year: Sometimes true    Ran Out of Food in the Last Year: Sometimes true  Transportation Needs: No Transportation Needs (03/26/2024)   Epic    Lack of Transportation (Medical): No    Lack of Transportation (Non-Medical): No  Physical Activity: Not on file  Stress: Not on file  Social Connections: Not on file  Intimate Partner Violence: Not At Risk (03/26/2024)   Epic    Fear of Current or Ex-Partner: No    Emotionally Abused: No    Physically Abused: No    Sexually Abused: No  Depression (PHQ2-9): Low Risk (03/26/2024)   Depression (PHQ2-9)    PHQ-2 Score: 0  Alcohol Screen: Not on file  Housing: Low Risk (03/26/2024)   Epic    Unable to Pay for Housing in the Last Year: No    Number of Times Moved in the Last Year: 0    Homeless in the Last Year: No  Utilities: Not At Risk (11/24/2023)   Epic    Threatened with loss of utilities: No  Health Literacy: Inadequate Health Literacy (07/14/2023)   B1300 Health Literacy    Frequency of need for help with medical instructions: Sometimes    Outpatient Medications Prior to Visit  Medication Sig Dispense Refill   acetaminophen  (TYLENOL ) 325 MG tablet Take 2 tablets (650 mg total) by mouth every 6 (six) hours as needed for mild pain (pain score 1-3).     aspirin  EC 81 MG tablet Take 1 tablet (81 mg total) by mouth daily. Swallow whole. 90 tablet 3   gabapentin  (NEURONTIN ) 300 MG  capsule Take 1 capsule (300 mg total) by mouth 3 (three) times daily. 90 capsule 3   metFORMIN  (GLUCOPHAGE ) 500 MG tablet Take 1 tablet (500 mg total) by mouth 2 (two) times daily with a meal. 180 tablet 3   rosuvastatin  (CRESTOR ) 20 MG tablet Take 1 tablet (20 mg total) by mouth daily. 90 tablet 3   clopidogrel  (PLAVIX ) 75 MG tablet Take 1 tablet (75 mg total) by mouth daily. 90 tablet 3   isosorbide  mononitrate (IMDUR ) 30 MG 24 hr tablet Take 1 tablet (30 mg total) by mouth daily. 90 tablet 3   metoprolol  tartrate (LOPRESSOR ) 25 MG tablet Take 0.5 tablets (12.5 mg total)  by mouth 2 (two) times daily. 90 tablet 3   Blood Glucose Monitoring Suppl (BLOOD GLUCOSE MONITOR SYSTEM) w/Device KIT Use in the morning, at noon, and at bedtime to monitor glucose. May substitute to any manufacturer covered by patient's insurance. (Patient not taking: Reported on 03/26/2024) 1 kit 0   clotrimazole  (LOTRIMIN ) 1 % cream Apply 2 times daily between your toes for 2 weeks. (Patient not taking: Reported on 03/26/2024) 30 g 0   dapagliflozin  propanediol (FARXIGA ) 10 MG TABS tablet Take 1 tablet (10 mg total) by mouth daily. (Patient not taking: Reported on 03/26/2024) 90 tablet 3   nitroGLYCERIN  (NITROSTAT ) 0.4 MG SL tablet Take 1 tablet, under your tongue, while sitting.  If no relief of pain may repeat, one tab every 5 minutes up to 3 tablets total over 15 minutes.  If no relief CALL 911. 25 tablet 3   glipiZIDE  (GLUCOTROL ) 5 MG tablet Take 0.5 tablets (2.5 mg total) by mouth daily before breakfast. (Patient not taking: Reported on 03/26/2024) 45 tablet 1   sennosides-docusate sodium  (SENOKOT-S) 8.6-50 MG tablet Take 2 tablets by mouth at bedtime as needed for constipation. (Patient not taking: Reported on 03/26/2024) 60 tablet 1   No facility-administered medications prior to visit.    Allergies[1]  ROS Review of Systems  Constitutional:  Negative for appetite change, chills, fatigue and fever.  HENT:  Negative for  congestion, postnasal drip, rhinorrhea and sneezing.   Respiratory:  Negative for cough, shortness of breath and wheezing.   Cardiovascular:  Negative for chest pain, palpitations and leg swelling.  Gastrointestinal:  Positive for constipation. Negative for abdominal pain, nausea and vomiting.  Genitourinary:  Negative for difficulty urinating, dysuria, flank pain and frequency.  Musculoskeletal:  Positive for arthralgias and back pain. Negative for joint swelling and myalgias.  Skin:  Negative for color change, pallor, rash and wound.  Neurological:  Negative for dizziness, facial asymmetry, weakness, numbness and headaches.  Psychiatric/Behavioral:  Negative for behavioral problems, confusion, self-injury and suicidal ideas.       Objective:    Physical Exam Vitals and nursing note reviewed.  Constitutional:      General: He is not in acute distress.    Appearance: Normal appearance. He is not ill-appearing, toxic-appearing or diaphoretic.  Eyes:     General: No scleral icterus.       Right eye: No discharge.        Left eye: No discharge.     Extraocular Movements: Extraocular movements intact.     Conjunctiva/sclera: Conjunctivae normal.  Cardiovascular:     Rate and Rhythm: Normal rate and regular rhythm.     Pulses: Normal pulses.     Heart sounds: Normal heart sounds. No murmur heard.    No friction rub. No gallop.  Pulmonary:     Effort: Pulmonary effort is normal. No respiratory distress.     Breath sounds: Normal breath sounds. No stridor. No wheezing, rhonchi or rales.  Chest:     Chest wall: No tenderness.  Abdominal:     General: There is no distension.     Palpations: Abdomen is soft.     Tenderness: There is no abdominal tenderness. There is no right CVA tenderness, left CVA tenderness or guarding.  Musculoskeletal:        General: No deformity or signs of injury.     Right lower leg: No edema.     Left lower leg: No edema.  Skin:    General: Skin is warm  and dry.     Capillary Refill: Capillary refill takes less than 2 seconds.     Coloration: Skin is not jaundiced or pale.     Findings: No bruising, erythema or lesion.  Neurological:     Mental Status: He is alert and oriented to person, place, and time.     Motor: No weakness.     Gait: Gait abnormal.     Comments: Using a cane for ambulation  Psychiatric:        Mood and Affect: Mood normal.        Behavior: Behavior normal.        Thought Content: Thought content normal.        Judgment: Judgment normal.     BP 119/67   Pulse 79   Temp 97.6 F (36.4 C)   Wt 167 lb (75.8 kg)   SpO2 99%   BMI 30.06 kg/m  Wt Readings from Last 3 Encounters:  03/26/24 167 lb (75.8 kg)  01/25/24 155 lb (70.3 kg)  11/24/23 156 lb (70.8 kg)    Lab Results  Component Value Date   TSH 1.362 05/12/2023   Lab Results  Component Value Date   WBC 5.1 11/24/2023   HGB 14.8 11/24/2023   HCT 47.0 11/24/2023   MCV 92 11/24/2023   PLT 213 11/24/2023   Lab Results  Component Value Date   NA 138 11/24/2023   K 4.0 11/24/2023   CO2 21 11/24/2023   GLUCOSE 104 (H) 11/24/2023   BUN 18 11/24/2023   CREATININE 0.72 (L) 11/24/2023   BILITOT 1.0 11/24/2023   ALKPHOS 122 (H) 11/24/2023   AST 14 11/24/2023   ALT 12 11/24/2023   PROT 7.3 11/24/2023   ALBUMIN  4.3 11/24/2023   CALCIUM  9.3 11/24/2023   ANIONGAP 7 06/17/2023   EGFR 101 11/24/2023   Lab Results  Component Value Date   CHOL 98 (L) 07/07/2023   Lab Results  Component Value Date   HDL 30 (L) 07/07/2023   Lab Results  Component Value Date   LDLCALC 53 07/07/2023   Lab Results  Component Value Date   TRIG 69 07/07/2023   Lab Results  Component Value Date   CHOLHDL 3.3 07/07/2023   Lab Results  Component Value Date   HGBA1C 7.0 (A) 11/24/2023      Assessment & Plan:   Problem List Items Addressed This Visit       Cardiovascular and Mediastinum   Primary hypertension (Chronic)   BP Readings from Last 3  Encounters:  03/26/24 119/67  01/09/24 123/75  11/24/23 113/63   Blood pressure 119/67 mmHg. - Continue metoprolol ,12.5 mg take twice daily. Continue isosorbide  mononitrate 30 mg daily       Relevant Medications   metoprolol  tartrate (LOPRESSOR ) 25 MG tablet   isosorbide  mononitrate (IMDUR ) 30 MG 24 hr tablet   CAD (coronary artery disease) (Chronic)   Continue Plavix  75 mg daily, aspirin  81 mg daily metoprolol  12.5 mg twice daily, isosorbide  mononitrate 30 mg daily, rosuvastatin  20mg  daily      Relevant Medications   metoprolol  tartrate (LOPRESSOR ) 25 MG tablet   isosorbide  mononitrate (IMDUR ) 30 MG 24 hr tablet   clopidogrel  (PLAVIX ) 75 MG tablet     Endocrine   Type 2 diabetes mellitus with complication, without long-term current use of insulin  (HCC)   Type 2 diabetes mellitus Managed with glipizide , metformin , and empagliflozin . Glipizide  not taken for a week due to financial constraints. No glucometer for home monitoring. Reports  fatigue, possible hypoglycemia. - Refilled glipizide  2.5 mg daily prescription.  Continue metformin  500 mg twice daily - Advised obtaining glucometer for home monitoring. - Ordered A1c test. - Educated on hypoglycemia signs and advised eating if symptoms occur. - Encouraged taking medications with food. Appreciate collaboration with the clinical pharmacist       Relevant Medications   glipiZIDE  (GLUCOTROL ) 5 MG tablet   Other Relevant Orders   Basic Metabolic Panel   Lipid panel   Hemoglobin A1c     Musculoskeletal and Integument   Primary osteoarthritis of right shoulder   Advised Tylenol  650 mg every 8 hours as needed, Gabapentin  300 mg 3 times daily        Other   Constipation   Constipation Reports hard stools. Not taking prescribed Senokot. - Refilled Senokot prescription. - Advised taking two Senokot tablets at bedtime as needed. - Encouraged drinking 64 ounces of water daily and consuming vegetables  .      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 Surgery not pursued due to lack of insurance. - Advised taking gabapentin  300 mg three times daily. - Recommended Tylenol  for additional pain relief. - Discussed surgery option if insurance becomes available.       Hyperlipidemia   Lab Results  Component Value Date   CHOL 98 (L) 07/07/2023   HDL 30 (L) 07/07/2023   LDLCALC 53 07/07/2023   LDLDIRECT 96 11/24/2023   TRIG 69 07/07/2023   CHOLHDL 3.3 07/07/2023   Continue rosuvastatin  20 mg daily, - Advised dietary modifications, reducing salty, fatty, and fried foods.      Relevant Medications   metoprolol  tartrate (LOPRESSOR ) 25 MG tablet   isosorbide  mononitrate (IMDUR ) 30 MG 24 hr tablet   S/P CABG x 4   Relevant Medications   clopidogrel  (PLAVIX ) 75 MG tablet   Food insecurity - Primary   Food from the clinic pantry provided         Financial difficulties    Affects ability to afford medications. Financial counselor recommended. - Referred to financial counselor to assist with application for orange card - Advised discussing financial difficulties with pharmacy for charge account options. Hyperlipidemia       Meds ordered this encounter  Medications   DISCONTD: sennosides-docusate sodium  (SENOKOT-S) 8.6-50 MG tablet    Sig: Take 2 tablets by mouth at bedtime as needed for constipation.    Dispense:  60 tablet    Refill:  1   DISCONTD: glipiZIDE  (GLUCOTROL ) 5 MG tablet    Sig: Take 0.5 tablets (2.5 mg total) by mouth daily before breakfast.    Dispense:  45 tablet    Refill:  1    DOH - no insurance   metoprolol  tartrate (LOPRESSOR ) 25 MG tablet    Sig: Take 0.5 tablets (12.5 mg total) by mouth 2 (two) times daily.    Dispense:  90 tablet    Refill:  3   DISCONTD: clopidogrel  (PLAVIX ) 75 MG tablet    Sig:  Take 1 tablet (75 mg total) by mouth daily.    Dispense:  90 tablet    Refill:  3   isosorbide  mononitrate (IMDUR ) 30 MG 24 hr tablet    Sig: Take 1 tablet (30 mg total) by mouth daily.    Dispense:  90 tablet    Refill:  3   glipiZIDE  (GLUCOTROL ) 5 MG tablet    Sig: Take 0.5 tablets (2.5 mg total) by mouth daily before breakfast.    Dispense:  45 tablet    Refill:  1    DOH - no insurance. Please bill   sennosides-docusate sodium  (SENOKOT-S) 8.6-50 MG tablet    Sig: Take 2 tablets by mouth at bedtime as needed for constipation.    Dispense:  60 tablet    Refill:  1    Please bill   clopidogrel  (PLAVIX ) 75 MG tablet    Sig: Take 1 tablet (75 mg total) by mouth daily.    Dispense:  90 tablet    Refill:  3    Follow-up: Return in about 3 months (around 06/24/2024).    Tareva Leske R Noa Constante, FNP     [1]  Allergies Allergen Reactions   Atorvastatin  Other (See Comments)    Muscle pain   "

## 2024-03-26 NOTE — Assessment & Plan Note (Addendum)
" ° °  Chronic right knee pain due to osteoarthritis Chronic pain managed with gabapentin  and Tylenol . Surgery not pursued due to lack of insurance. - Advised taking gabapentin  300 mg three times daily. - Recommended Tylenol  for additional pain relief. - Discussed surgery option if insurance becomes available.  "

## 2024-03-26 NOTE — Assessment & Plan Note (Signed)
 BP Readings from Last 3 Encounters:  03/26/24 119/67  01/09/24 123/75  11/24/23 113/63   Blood pressure 119/67 mmHg. - Continue metoprolol ,12.5 mg take twice daily. Continue isosorbide  mononitrate 30 mg daily

## 2024-03-27 ENCOUNTER — Other Ambulatory Visit: Payer: Self-pay

## 2024-03-27 ENCOUNTER — Ambulatory Visit: Payer: Self-pay | Admitting: Nurse Practitioner

## 2024-03-27 LAB — BASIC METABOLIC PANEL WITH GFR
BUN/Creatinine Ratio: 18 (ref 10–24)
BUN: 13 mg/dL (ref 8–27)
CO2: 22 mmol/L (ref 20–29)
Calcium: 9.7 mg/dL (ref 8.6–10.2)
Chloride: 101 mmol/L (ref 96–106)
Creatinine, Ser: 0.73 mg/dL — ABNORMAL LOW (ref 0.76–1.27)
Glucose: 146 mg/dL — ABNORMAL HIGH (ref 70–99)
Potassium: 4 mmol/L (ref 3.5–5.2)
Sodium: 138 mmol/L (ref 134–144)
eGFR: 101 mL/min/1.73

## 2024-03-27 LAB — LIPID PANEL
Chol/HDL Ratio: 5 ratio (ref 0.0–5.0)
Cholesterol, Total: 169 mg/dL (ref 100–199)
HDL: 34 mg/dL — ABNORMAL LOW
LDL Chol Calc (NIH): 102 mg/dL — ABNORMAL HIGH (ref 0–99)
Triglycerides: 188 mg/dL — ABNORMAL HIGH (ref 0–149)
VLDL Cholesterol Cal: 33 mg/dL (ref 5–40)

## 2024-03-27 LAB — HEMOGLOBIN A1C
Est. average glucose Bld gHb Est-mCnc: 192 mg/dL
Hgb A1c MFr Bld: 8.3 % — ABNORMAL HIGH (ref 4.8–5.6)

## 2024-03-27 NOTE — Addendum Note (Signed)
 Addended by: JUANICE PARSON R on: 03/27/2024 11:45 AM   Modules accepted: Orders, Level of Service

## 2024-04-23 ENCOUNTER — Telehealth: Payer: Self-pay

## 2024-04-23 ENCOUNTER — Ambulatory Visit: Payer: Self-pay

## 2024-04-23 NOTE — Progress Notes (Signed)
 Attempted to contact patient for scheduled appointment for medication management. Left HIPAA compliant message for patient to return my call at their convenience.   Appears second AZ&Me shipment went out on 04/01/24. Patient did not receive first shipment of Farxiga , so will need to follow up to ensure he received this shipment.   Lorain Baseman, PharmD [facility] [phone]
